# Patient Record
Sex: Female | Born: 1940
Health system: Southern US, Community
[De-identification: ages and names within clinical notes are randomized; demographics above are authoritative.]

## PROBLEM LIST (undated history)

## (undated) DIAGNOSIS — B370 Candidal stomatitis: Secondary | ICD-10-CM

## (undated) DIAGNOSIS — J45909 Unspecified asthma, uncomplicated: Secondary | ICD-10-CM

## (undated) DIAGNOSIS — J4540 Moderate persistent asthma, uncomplicated: Secondary | ICD-10-CM

## (undated) DIAGNOSIS — R42 Dizziness and giddiness: Secondary | ICD-10-CM

## (undated) DIAGNOSIS — M8580 Other specified disorders of bone density and structure, unspecified site: Secondary | ICD-10-CM

## (undated) DIAGNOSIS — H3530 Unspecified macular degeneration: Secondary | ICD-10-CM

## (undated) DIAGNOSIS — M1990 Unspecified osteoarthritis, unspecified site: Secondary | ICD-10-CM

## (undated) DIAGNOSIS — H409 Unspecified glaucoma: Secondary | ICD-10-CM

## (undated) DIAGNOSIS — G43909 Migraine, unspecified, not intractable, without status migrainosus: Secondary | ICD-10-CM

## (undated) DIAGNOSIS — R413 Other amnesia: Secondary | ICD-10-CM

## (undated) DIAGNOSIS — G629 Polyneuropathy, unspecified: Secondary | ICD-10-CM

## (undated) DIAGNOSIS — K219 Gastro-esophageal reflux disease without esophagitis: Secondary | ICD-10-CM

## (undated) DIAGNOSIS — Z8781 Personal history of (healed) traumatic fracture: Secondary | ICD-10-CM

## (undated) DIAGNOSIS — T7840XA Allergy, unspecified, initial encounter: Secondary | ICD-10-CM

## (undated) DIAGNOSIS — S6992XA Unspecified injury of left wrist, hand and finger(s), initial encounter: Secondary | ICD-10-CM

## (undated) DIAGNOSIS — H01009 Unspecified blepharitis unspecified eye, unspecified eyelid: Secondary | ICD-10-CM

## (undated) DIAGNOSIS — G609 Hereditary and idiopathic neuropathy, unspecified: Secondary | ICD-10-CM

## (undated) DIAGNOSIS — J309 Allergic rhinitis, unspecified: Secondary | ICD-10-CM

## (undated) DIAGNOSIS — H1010 Acute atopic conjunctivitis, unspecified eye: Secondary | ICD-10-CM

## (undated) HISTORY — DX: Unspecified osteoarthritis, unspecified site: M19.90

## (undated) HISTORY — PX: CATARACT EXTRACTION, BILATERAL: SHX1313

## (undated) HISTORY — DX: Acute atopic conjunctivitis, unspecified eye: H10.10

## (undated) HISTORY — PX: BUNIONECTOMY: SHX129

## (undated) HISTORY — DX: Unspecified blepharitis unspecified eye, unspecified eyelid: H01.009

## (undated) HISTORY — DX: Hereditary and idiopathic neuropathy, unspecified: G60.9

## (undated) HISTORY — DX: Dizziness and giddiness: R42

## (undated) HISTORY — DX: Unspecified injury of left wrist, hand and finger(s), initial encounter: S69.92XA

## (undated) HISTORY — DX: Allergy, unspecified, initial encounter: T78.40XA

## (undated) HISTORY — DX: Gastro-esophageal reflux disease without esophagitis: K21.9

## (undated) HISTORY — DX: Unspecified macular degeneration: H35.30

## (undated) HISTORY — PX: SALPINGECTOMY: SHX328

## (undated) HISTORY — DX: Allergic rhinitis, unspecified: J30.9

## (undated) HISTORY — DX: Unspecified glaucoma: H40.9

## (undated) HISTORY — DX: Candidal stomatitis: B37.0

## (undated) HISTORY — DX: Other specified disorders of bone density and structure, unspecified site: M85.80

## (undated) HISTORY — PX: THROAT SURGERY: SHX803

## (undated) HISTORY — PX: ECTOPIC PREGNANCY SURGERY: SHX613

## (undated) HISTORY — DX: Other amnesia: R41.3

## (undated) HISTORY — DX: Moderate persistent asthma, uncomplicated: J45.40

## (undated) HISTORY — DX: Polyneuropathy, unspecified: G62.9

## (undated) HISTORY — DX: Migraine, unspecified, not intractable, without status migrainosus: G43.909

## (undated) HISTORY — PX: KNEE SURGERY: SHX244

## (undated) HISTORY — PX: COLONOSCOPY: SHX174

## (undated) HISTORY — DX: Personal history of (healed) traumatic fracture: Z87.81

## (undated) HISTORY — PX: OTHER SURGICAL HISTORY: SHX169

## (undated) HISTORY — DX: Unspecified asthma, uncomplicated: J45.909

---

## 1995-09-04 HISTORY — PX: OTHER SURGICAL HISTORY: SHX169

## 2000-02-23 ENCOUNTER — Encounter: Payer: Self-pay | Admitting: Orthopedic Surgery

## 2000-02-23 ENCOUNTER — Ambulatory Visit (HOSPITAL_COMMUNITY): Admission: RE | Admit: 2000-02-23 | Discharge: 2000-02-23 | Payer: Self-pay | Admitting: Orthopedic Surgery

## 2000-04-25 ENCOUNTER — Encounter: Admission: RE | Admit: 2000-04-25 | Discharge: 2000-04-25 | Payer: Self-pay | Admitting: Family Medicine

## 2000-04-25 ENCOUNTER — Encounter: Payer: Self-pay | Admitting: Family Medicine

## 2001-04-29 ENCOUNTER — Encounter: Payer: Self-pay | Admitting: Family Medicine

## 2001-04-29 ENCOUNTER — Encounter: Admission: RE | Admit: 2001-04-29 | Discharge: 2001-04-29 | Payer: Self-pay | Admitting: Family Medicine

## 2001-08-01 ENCOUNTER — Encounter: Admission: RE | Admit: 2001-08-01 | Discharge: 2001-08-01 | Payer: Self-pay | Admitting: Family Medicine

## 2001-08-01 ENCOUNTER — Encounter: Payer: Self-pay | Admitting: Family Medicine

## 2002-06-19 ENCOUNTER — Encounter (INDEPENDENT_AMBULATORY_CARE_PROVIDER_SITE_OTHER): Payer: Self-pay

## 2002-06-19 ENCOUNTER — Ambulatory Visit (HOSPITAL_COMMUNITY): Admission: RE | Admit: 2002-06-19 | Discharge: 2002-06-19 | Payer: Self-pay | Admitting: Gastroenterology

## 2003-06-07 ENCOUNTER — Encounter: Payer: Self-pay | Admitting: Otolaryngology

## 2003-06-07 ENCOUNTER — Encounter: Admission: RE | Admit: 2003-06-07 | Discharge: 2003-06-07 | Payer: Self-pay | Admitting: Otolaryngology

## 2003-07-16 ENCOUNTER — Emergency Department (HOSPITAL_COMMUNITY): Admission: EM | Admit: 2003-07-16 | Discharge: 2003-07-16 | Payer: Self-pay | Admitting: Emergency Medicine

## 2003-09-04 HISTORY — PX: OTHER SURGICAL HISTORY: SHX169

## 2003-09-28 ENCOUNTER — Ambulatory Visit (HOSPITAL_COMMUNITY): Admission: RE | Admit: 2003-09-28 | Discharge: 2003-09-28 | Payer: Self-pay | Admitting: Internal Medicine

## 2004-04-13 ENCOUNTER — Encounter: Admission: RE | Admit: 2004-04-13 | Discharge: 2004-04-13 | Payer: Self-pay | Admitting: Family Medicine

## 2004-05-25 ENCOUNTER — Encounter: Admission: RE | Admit: 2004-05-25 | Discharge: 2004-05-25 | Payer: Self-pay | Admitting: Orthopaedic Surgery

## 2004-07-21 ENCOUNTER — Ambulatory Visit (HOSPITAL_COMMUNITY): Admission: RE | Admit: 2004-07-21 | Discharge: 2004-07-21 | Payer: Self-pay | Admitting: General Surgery

## 2004-08-16 ENCOUNTER — Inpatient Hospital Stay (HOSPITAL_COMMUNITY): Admission: EM | Admit: 2004-08-16 | Discharge: 2004-08-17 | Payer: Self-pay | Admitting: General Surgery

## 2009-04-07 ENCOUNTER — Encounter: Admission: RE | Admit: 2009-04-07 | Discharge: 2009-04-07 | Payer: Self-pay | Admitting: Family Medicine

## 2010-11-24 ENCOUNTER — Other Ambulatory Visit: Payer: Self-pay | Admitting: Geriatric Medicine

## 2010-11-24 ENCOUNTER — Other Ambulatory Visit (HOSPITAL_COMMUNITY)
Admission: RE | Admit: 2010-11-24 | Discharge: 2010-11-24 | Disposition: A | Discharge status: Home / Self Care | Payer: Medicare Other | Source: Ambulatory Visit | Attending: Geriatric Medicine | Admitting: Geriatric Medicine

## 2010-11-24 DIAGNOSIS — Z1159 Encounter for screening for other viral diseases: Secondary | ICD-10-CM | POA: Insufficient documentation

## 2010-11-24 DIAGNOSIS — Z01419 Encounter for gynecological examination (general) (routine) without abnormal findings: Secondary | ICD-10-CM | POA: Insufficient documentation

## 2011-01-19 NOTE — Op Note (Signed)
NAMEANGY, SWEARENGIN NO.:  [id number]   MEDICAL RECORD NO.:  [id number]                   PATIENT TYPE:  AMB   LOCATION:  ENDO                                 FACILITY:  [facility]   PHYSICIAN:  Bernette Redbird, MD                  DATE OF BIRTH:  06-13-1941   DATE OF PROCEDURE:  06/19/2002  DATE OF DISCHARGE:                                 OPERATIVE REPORT   PROCEDURE:  Colonoscopy with polypectomy.   INDICATIONS FOR PROCEDURE:  Screening for colon cancer in a 70 year old  female with a family history of colon polyps.   FINDINGS:  Erythematous 1.5 cm pedunculated polyp removed from the proximal  ascending colon.   DESCRIPTION OF PROCEDURE:  The nature, purpose, and risks of the procedure  had been discussed with the patient who provided written consent. Sedation  was fentanyl 40 mcg and Versed 4 mg IV without arrhythmias or desaturation.  The Olympus pediatric video colonoscope was advanced with some looping,  overcome by external abdominal compression, to the terminal ileum and  pullback was then performed.   The cecum was somewhat eccentrically located and the scope had to be slid  through a slit to open up the entire base of the cecum, but the appendiceal  orifice was eventually visualized fully and it is felt that no areas of the  cecum were left unseen.   A short distance above the cecum, approximately three folds above the cecum,  there was a 1.5 cm erythematous polyp on a short stalk, benign in  appearance, but with a lot of white exudate, suggesting that it might be a  hemartoma or an inflammatory polyp. The base of the polyp was injected with  1.0 cc of 1:10,000 epinephrine with good blanching and a bleb being formed  after which the Erby coagulator was used to transect the stalk of the polyp,  and there was good hemostasis and no evidence of excessive cautery. The  polyp was retrieved with the [facility] retrieval basket by pulling the scope  out  through the colon and the patient was recolonoscoped to the level of the  polypectomy site and reexamined by a careful pullout, at which time, I saw  no evidence of any other polyps, nor any cancer, colitis, vascular  malformations, or diverticulosis.   Retroflexion in the rectum was attempted, but could not be accomplished.  Reinspection of the rectosigmoid, however, was otherwise unremarkable.   The patient tolerated the procedure well and there were no apparent  complications.    IMPRESSION:  Medium sized to medium-large polyp removed from the ascending  colon, otherwise unremarkable examination in a patient with a family history  of colon polyps.   PLAN:  Await pathology.  Bernette Redbird, MD    RB/MEDQ  D:  06/19/2002  T:  06/21/2002  Job:  [id number]   cc:   Mosetta Putt, MD

## 2011-01-19 NOTE — Op Note (Signed)
Victoria Trevino, Victoria Trevino NO.:  [id number]   MEDICAL RECORD NO.:  [id number]          PATIENT TYPE:  AMB   LOCATION:  DAY                          FACILITY:  [facility]   PHYSICIAN:  Adolph Pollack, M.D.DATE OF BIRTH:  06-03-1941   DATE OF PROCEDURE:  08/15/2004  DATE OF DISCHARGE:                                 OPERATIVE REPORT   PREOPERATIVE DIAGNOSIS:  Gastroesophageal reflux disease with small hiatal  hernia.   POSTOPERATIVE DIAGNOSIS:  Gastroesophageal reflux disease with small hiatal  hernia.   PROCEDURE:  Laparoscopic Nissen fundoplication and hiatal hernia repair.   SURGEON:  Adolph Pollack, M.D.   ASSISTANT:  Consuello Bossier, M.D.   ANESTHESIA:  General.   INDICATION:  Victoria Trevino is a 70 year old female, who has been having  problems with gastroesophageal reflux disease that was initially well-  controlled.  However, she has had increasing difficulties, and now she has  supra-esophageal manifestations as well.  She has been having breakthrough  symptoms.  She now presents for elective antireflux surgery.  The procedure  and the risks as well as the success rates were discussed with her  preoperatively.   TECHNIQUE:  She is seen in the holding area, then brought to the operating  room, placed supine on the operating table, and given general anesthetic.  A  Foley catheter was placed in the bladder.  The abdominal wall was sterilely  prepped and draped.  Dilute Marcaine solution was infiltrated in the  supraumbilical region, and a small supraumbilical incision was made,  incising the skin, subcutaneous tissue, fascia, and peritoneum.  The  peritoneal cavity was entered under direct vision.  A pursestring suture of  0 Vicryl was placed around the fascial edges.  A Hasson trochar was  introduced into the peritoneal cavity, and a pneumoperitoneum was created by  insufflation of CO2 gas.   Next, the laparoscope was introduced.  Under direct vision,  a 5 mm trocar  was placed in the right mid lateral abdomen, and a 5 mm liver retractor was  placed through it.  The left lobe of the liver was retracted anteriorly,  exposing the hiatus. Two 10 mm trocars were then placed in the right upper  quadrant and one in the left upper quadrant.  The gastroesophageal junction  was gently grasped and retracted toward the left, exposing the thing  gastrohepatic ligament which then was divided using the harmonic scalpel at  the level of the right crus.  The thin phrenoesophageal ligament anteriorly  was divided with the harmonic scalpel exposing the left crus.  Using blunt  dissection, the right crus was dissected free from the esophagus, and a  retroesophageal window was created.   Next, I grasped the mid fundus and divided short gastric vessels with the  harmonic scalpel and between hemoclips, mobilizing the mid fundus.  Following this, a Penrose drain was placed across the gastroesophageal  junction, and it was retracted anteriorly exposing a small hiatal hernia.  I  subsequently closed the hernia with three interrupted size 0 nonabsorbable  sutures.  A size 50 lighted  Bougie was then passed down the esophagus into  the stomach.  The fundus was then grasped and brought through the  retroesophageal window, and then a 360-degree fundoplication was performed,  measuring 2.5 cm in length.  First, two stitches of the fundoplication  involved the left leaf of the wrap, the esophagus, and the right leaf of the  wrap.  The third and final stitches involved the right and left leaves of  the wrap.  Clips were then placed on all the fundoplication stitches.  The  Bougie was removed intact.  No esophageal injury or gastric injury was  noted.   The wrap was floppy and under no tension.  The area was hemostatic.  I  removed the liver retractor.  The trocars were then all removed.  The  supraumbilical fascial defect was closed by tightening up and tying down  the  pursestring suture.  The skin incisions were closed with 4-0 Monocryl  subcuticular stitches followed by Steri-Strips and sterile dressings.   She tolerated the procedure well without any apparent complications and was  subsequently taken to the recovery room in satisfactory condition.     Victoria Trevino  D:  08/15/2004  T:  08/15/2004  Job:  [id number]   cc:   Lucky Cowboy, MD  [phone] [place]  [place] [address]  Fax: [phone]   Mosetta Putt, M.D.  [address] [place]  [place] [address]  Fax: [phone]

## 2011-01-19 NOTE — Op Note (Signed)
NAME:  Victoria Trevino, Victoria Trevino                           ACCOUNT NO.:  [id number]   MEDICAL RECORD NO.:  [id number]                   PATIENT TYPE:  AMB   LOCATION:  ENDO                                 FACILITY:  [facility]   PHYSICIAN:  Wilhemina Bonito. Marina Goodell, M.D. [facility]             DATE OF BIRTH:  01-May-1941   DATE OF PROCEDURE:  09/28/2003  DATE OF DISCHARGE:  09/28/2003                                 OPERATIVE REPORT   PROCEDURE:  Esophageal manometry.   REFERRING PHYSICIAN:  Dr. Lucky Cowboy.   INDICATION:  Chronic cough and heartburn.   HISTORY:  This is a 69 year old female with symptoms of heartburn, cough,  and laryngitis.  An esophageal manometry has been order by Dr. Gerilyn Pilgrim.   DESCRIPTION OF PROCEDURE:  Esophageal manometry was performed at the [facility].  The probe was inserted via the left naris into  the stomach with ease.  Pull-through was uneventful and adequate data  obtained for interpretation.  The findings are as follows:   1. The upper esophageal sphincter demonstrated normal relaxation and     coordination.  2. The esophageal body demonstrated normal wave amplitude and normal wave     provocation, thus normal peristalsis.  3. The lower esophageal sphincter resting pressure was normal at 29.2 mmHg.     In addition, normal and complete relaxation with wet swallowing was     demonstrated repeatedly.   IMPRESSION:  Normal esophageal manometry.   PLANS:  Per Dr. Gerilyn Pilgrim.                                               Wilhemina Bonito. Marina Goodell, M.D. [facility]    JNP/MEDQ  D:  10/01/2003  T:  10/01/2003  Job:  [id number]   cc:   Lucky Cowboy, M.D.  [address] [place]  [place] [address]  Fax: [phone]   Mosetta Putt, M.D.  [address] [place]  [place] [address]  Fax: [phone]

## 2011-05-25 ENCOUNTER — Ambulatory Visit (HOSPITAL_COMMUNITY)
Admission: RE | Admit: 2011-05-25 | Discharge: 2011-05-25 | Disposition: A | Discharge status: Home / Self Care | Payer: Medicare Other | Source: Ambulatory Visit | Attending: Geriatric Medicine | Admitting: Geriatric Medicine

## 2011-05-25 ENCOUNTER — Other Ambulatory Visit (HOSPITAL_COMMUNITY): Payer: Self-pay | Admitting: Geriatric Medicine

## 2011-05-25 DIAGNOSIS — R51 Headache: Secondary | ICD-10-CM | POA: Insufficient documentation

## 2011-05-25 DIAGNOSIS — R52 Pain, unspecified: Secondary | ICD-10-CM

## 2011-05-25 DIAGNOSIS — H9209 Otalgia, unspecified ear: Secondary | ICD-10-CM | POA: Insufficient documentation

## 2011-05-25 DIAGNOSIS — R42 Dizziness and giddiness: Secondary | ICD-10-CM | POA: Insufficient documentation

## 2013-01-25 ENCOUNTER — Emergency Department (INDEPENDENT_AMBULATORY_CARE_PROVIDER_SITE_OTHER): Payer: Medicare Other

## 2013-01-25 ENCOUNTER — Encounter (HOSPITAL_COMMUNITY): Payer: Self-pay | Admitting: Emergency Medicine

## 2013-01-25 ENCOUNTER — Emergency Department (HOSPITAL_COMMUNITY)
Admission: EM | Admit: 2013-01-25 | Discharge: 2013-01-25 | Disposition: A | Discharge status: Home / Self Care | Payer: Medicare Other | Source: Home / Self Care | Attending: Family Medicine | Admitting: Family Medicine

## 2013-01-25 DIAGNOSIS — S93409A Sprain of unspecified ligament of unspecified ankle, initial encounter: Secondary | ICD-10-CM

## 2013-01-25 DIAGNOSIS — S93401A Sprain of unspecified ligament of right ankle, initial encounter: Secondary | ICD-10-CM

## 2013-01-25 HISTORY — DX: Unspecified asthma, uncomplicated: J45.909

## 2013-01-25 MED ORDER — IBUPROFEN 800 MG PO TABS
800.0000 mg | ORAL_TABLET | Freq: Once | ORAL | Status: AC
  Administered 2013-01-25: 800 mg via ORAL

## 2013-01-25 MED ORDER — IBUPROFEN 800 MG PO TABS
ORAL_TABLET | ORAL | Status: AC
  Filled 2013-01-25: qty 1

## 2013-01-25 MED ORDER — HYDROCODONE-ACETAMINOPHEN 5-325 MG PO TABS: 1.0000 | ORAL_TABLET | ORAL | Status: DC | PRN

## 2013-01-25 NOTE — ED Notes (Signed)
Pt states that she was walking down some steps and missed a couple and landed with right foot toes pointed downward into ground. Pt states did not have pain at first. Was able to walk on ankle. Pain started later in the evening. Pt has used ice for comfort.

## 2013-01-25 NOTE — ED Provider Notes (Signed)
History     CSN: [id number]  Arrival date & time 01/25/13  1613   First MD Initiated Contact with Patient 01/25/13 1827      Chief Complaint  Patient presents with  . Ankle Injury    injury to right ankle missed steps.     Patient is a 72 y.o. female presenting with ankle pain. The history is provided by the patient.  Ankle Pain Location:  Ankle Time since incident:  11 hours Injury: yes   Mechanism of injury: fall   Fall:    Fall occurred:  Down stairs   Height of fall:  2 ft   Impact surface:  [employer] of impact:  Feet   Entrapped after fall: no   Ankle location:  R ankle Pain details:    Quality:  Pressure, sharp and aching   Radiates to:  Does not radiate   Severity:  Moderate   Duration:  6 hours   Timing:  Constant   Progression:  Worsening Chronicity:  New Dislocation: no   Foreign body present:  No foreign bodies Prior injury to area:  No Relieved by:  Rest Worsened by:  Bearing weight Ineffective treatments:  Acetaminophen Associated symptoms: swelling   Risk factors: no concern for non-accidental trauma   Pt reports while visiting her sister in [place] this morning she was preparing to leave and was coming down some indoor stairs when she missed a step. She did not completely fall but landed awkwardly on her (R) ankle. At the time she had minimal pain and did not think much of it. As she walked on it more and more during her flight home she noted subtle increase in pain. By the time she arrived in [place] she found she was unable to bear full weight d/t severe pain that by now was associated w/ noticeable swelling to the (R) ankle as well.  Past Medical History  Diagnosis Date  . Asthma     Past Surgical History  Procedure Laterality Date  . Knee surgery    . Ectopic pregnancy surgery    . Throat surgery    . [employer] infundibulm    . Back surgery      History reviewed. No pertinent family history.  History  Substance Use Topics  .  Smoking status: Never Smoker   . Smokeless tobacco: Not on file  . Alcohol Use: No    OB History   Grav Para Term Preterm Abortions TAB SAB Ect Mult Living                  Review of Systems  All other systems reviewed and are negative.    Allergies  Review of patient's allergies indicates no known allergies.  Home Medications   Current Outpatient Rx  Name  Route  Sig  Dispense  Refill  . brimonidine (ALPHAGAN P) 0.1 % SOLN               . Cetirizine HCl (ZYRTEC ALLERGY PO)   Oral   Take by mouth.         Marland Kitchen omeprazole (PRILOSEC) 10 MG capsule   Oral   Take 10 mg by mouth daily.           BP 119/53  Pulse 63  Temp(Src) 97.9 F (36.6 C) (Oral)  Resp 16  SpO2 100%  Physical Exam  Constitutional: She is oriented to person, place, and time. She appears well-developed and well-nourished.  HENT:  Head:  Normocephalic and atraumatic.  Eyes: Conjunctivae are normal.  Neck: Neck supple.  Cardiovascular: Normal rate.   Musculoskeletal:       Feet:  Mild swelling and TTP over the (R) medial malleolus. No open wound or obvious deformity.  Neurological: She is alert and oriented to person, place, and time.  Skin: Skin is warm and dry.  Psychiatric: She has a normal mood and affect.    ED Course  Procedures (including critical care time)  Labs Reviewed - No data to display Dg Ankle Complete Right  01/25/2013   *RADIOLOGY REPORT*  Clinical Data: Injured right ankle, medial pain.  RIGHT ANKLE - COMPLETE 3+ VIEW  Comparison: None.  Findings: No evidence of acute fracture or dislocation.  Ankle mortise intact with well-preserved joint space.  Well-preserved bone mineral density for age.  No intrinsic osseous abnormality. Moderately large joint effusion.  IMPRESSION: No osseous abnormality.   Original Report Authenticated By: Hulan Saas, M.D.     No diagnosis found.    MDM  Increased pain and swelling to inner aspect of (R) ankle after stumbling down a  couple of indoor stairs at approx 0730 today. Imaging neg. ASO applied and pt fitted for crutches. Pt instructed to RICE and use crutches to assist w/ weight bearing until pain improved. Encouraged to start Ibuprofen TID x 3 to 4 days and use Hydrocodone/APAP as needed for more sever pain. Pt to arrange f/u w/ her orthopedic this week.         Leanne Chang, NP 01/27/13 [phone]

## 2013-01-28 NOTE — ED Provider Notes (Signed)
Medical screening examination/treatment/procedure(s) were performed by non-physician practitioner and as supervising physician I was immediately available for consultation/collaboration.   MORENO-COLL,Xitlaly Ault; MD  Raheel Kunkle Moreno-Coll, MD 01/28/13 0752 

## 2014-03-18 ENCOUNTER — Other Ambulatory Visit (HOSPITAL_COMMUNITY): Payer: Self-pay

## 2014-03-19 ENCOUNTER — Ambulatory Visit (HOSPITAL_COMMUNITY)
Admission: RE | Admit: 2014-03-19 | Discharge: 2014-03-19 | Disposition: A | Discharge status: Home / Self Care | Payer: Medicare Other | Source: Ambulatory Visit | Attending: Geriatric Medicine | Admitting: Geriatric Medicine

## 2014-03-19 ENCOUNTER — Encounter (HOSPITAL_COMMUNITY): Payer: Self-pay | Admitting: Pharmacy Technician

## 2014-03-19 DIAGNOSIS — M810 Age-related osteoporosis without current pathological fracture: Secondary | ICD-10-CM | POA: Diagnosis present

## 2014-03-19 DIAGNOSIS — M949 Disorder of cartilage, unspecified: Secondary | ICD-10-CM | POA: Diagnosis not present

## 2014-03-19 DIAGNOSIS — IMO0002 Reserved for concepts with insufficient information to code with codable children: Secondary | ICD-10-CM | POA: Diagnosis not present

## 2014-03-19 DIAGNOSIS — M899 Disorder of bone, unspecified: Secondary | ICD-10-CM | POA: Diagnosis not present

## 2014-03-19 MED ORDER — ZOLEDRONIC ACID 5 MG/100ML IV SOLN
5.0000 mg | Freq: Once | INTRAVENOUS | Status: AC
  Administered 2014-03-19: 5 mg via INTRAVENOUS

## 2014-03-19 MED ORDER — ZOLEDRONIC ACID 5 MG/100ML IV SOLN
INTRAVENOUS | Status: AC
  Filled 2014-03-19: qty 100

## 2014-03-19 NOTE — Discharge Instructions (Signed)

## 2015-03-23 ENCOUNTER — Other Ambulatory Visit (HOSPITAL_COMMUNITY): Payer: Self-pay | Admitting: *Deleted

## 2015-03-24 ENCOUNTER — Ambulatory Visit (HOSPITAL_COMMUNITY)
Admission: RE | Admit: 2015-03-24 | Discharge: 2015-03-24 | Disposition: A | Discharge status: Home / Self Care | Payer: Commercial Managed Care - HMO | Source: Ambulatory Visit | Attending: Geriatric Medicine | Admitting: Geriatric Medicine

## 2015-03-24 DIAGNOSIS — M8580 Other specified disorders of bone density and structure, unspecified site: Secondary | ICD-10-CM | POA: Diagnosis not present

## 2015-03-24 MED ORDER — ZOLEDRONIC ACID 5 MG/100ML IV SOLN: 5.0000 mg | Freq: Once | INTRAVENOUS | Status: DC

## 2015-03-24 MED ORDER — ZOLEDRONIC ACID 5 MG/100ML IV SOLN
INTRAVENOUS | Status: AC
  Administered 2015-03-24: 5 mg
  Filled 2015-03-24: qty 100

## 2015-03-24 MED ORDER — SODIUM CHLORIDE 0.9 % IV SOLN
INTRAVENOUS | Status: DC
  Administered 2015-03-24: 10:00:00 via INTRAVENOUS

## 2015-06-22 ENCOUNTER — Other Ambulatory Visit: Payer: Self-pay | Admitting: Internal Medicine

## 2015-06-22 ENCOUNTER — Ambulatory Visit
Admission: RE | Admit: 2015-06-22 | Discharge: 2015-06-22 | Disposition: A | Discharge status: Home / Self Care | Payer: Commercial Managed Care - HMO | Source: Ambulatory Visit | Attending: Internal Medicine | Admitting: Internal Medicine

## 2015-06-22 DIAGNOSIS — S93602A Unspecified sprain of left foot, initial encounter: Secondary | ICD-10-CM

## 2015-06-24 ENCOUNTER — Other Ambulatory Visit: Payer: Self-pay | Admitting: Geriatric Medicine

## 2015-06-24 ENCOUNTER — Ambulatory Visit
Admission: RE | Admit: 2015-06-24 | Discharge: 2015-06-24 | Disposition: A | Discharge status: Home / Self Care | Payer: Commercial Managed Care - HMO | Source: Ambulatory Visit | Attending: Geriatric Medicine | Admitting: Geriatric Medicine

## 2015-06-24 DIAGNOSIS — M542 Cervicalgia: Secondary | ICD-10-CM

## 2015-08-02 ENCOUNTER — Encounter: Payer: Self-pay | Admitting: Allergy and Immunology

## 2015-08-02 ENCOUNTER — Ambulatory Visit (INDEPENDENT_AMBULATORY_CARE_PROVIDER_SITE_OTHER): Payer: Commercial Managed Care - HMO | Admitting: Allergy and Immunology

## 2015-08-02 VITALS — BP 112/62 | HR 80 | Resp 20 | Ht 68.0 in

## 2015-08-02 DIAGNOSIS — J387 Other diseases of larynx: Secondary | ICD-10-CM

## 2015-08-02 DIAGNOSIS — J309 Allergic rhinitis, unspecified: Secondary | ICD-10-CM | POA: Diagnosis not present

## 2015-08-02 DIAGNOSIS — J4540 Moderate persistent asthma, uncomplicated: Secondary | ICD-10-CM | POA: Insufficient documentation

## 2015-08-02 DIAGNOSIS — K219 Gastro-esophageal reflux disease without esophagitis: Secondary | ICD-10-CM

## 2015-08-02 DIAGNOSIS — H1010 Acute atopic conjunctivitis, unspecified eye: Secondary | ICD-10-CM | POA: Diagnosis not present

## 2015-08-02 DIAGNOSIS — J4541 Moderate persistent asthma with (acute) exacerbation: Secondary | ICD-10-CM

## 2015-08-02 HISTORY — DX: Moderate persistent asthma, uncomplicated: J45.40

## 2015-08-02 HISTORY — DX: Gastro-esophageal reflux disease without esophagitis: K21.9

## 2015-08-02 HISTORY — DX: Acute atopic conjunctivitis, unspecified eye: H10.10

## 2015-08-02 MED ORDER — METHYLPREDNISOLONE ACETATE 80 MG/ML IJ SUSP
80.0000 mg | Freq: Once | INTRAMUSCULAR | Status: AC
  Administered 2015-08-02: 80 mg via INTRAMUSCULAR

## 2015-08-02 MED ORDER — IPRATROPIUM-ALBUTEROL 0.5-2.5 (3) MG/3ML IN SOLN
3.0000 mL | Freq: Once | RESPIRATORY_TRACT | Status: AC
  Administered 2015-08-02: 3 mL via RESPIRATORY_TRACT

## 2015-08-02 NOTE — Progress Notes (Signed)
[facility] [place]  Follow-up Note  Refering Provider: Merlene LaughterStoneking, Hal, MD Primary Provider: Ginette OttoSTONEKING,HAL THOMAS, MD  Subjective:   Victoria Trevino is a 74 y.o. female who [facility] in re-evaluation of the following:  HPI Comments:  [name] returns to this clinic on 08/02/2015 in reevaluation of her asthma, allergic rhinoconjunctivitis, and LPR. She was doing great for the past 6 months but unfortunately this past Friday she developed cough with initially green phlegm production, shortness of breath, nasal congestion, nose blowing, stuffy head, and anosmia. She added her Alvesco to her Symbicort when she became sick. And she has been using her bronchodilator recently. Her reflux is been under good control as long she continues to use omeprazole 20 mg twice a day and ranitidine in the evening. If she uses omeprazole one time per day she gets reflux and regurgitation. She's not had any fever or ugly nasal discharge with this event. There is been no obvious contacts with individuals who have been sick.   Outpatient Encounter Prescriptions as of 08/02/2015  Medication Sig  . albuterol (PROVENTIL HFA;VENTOLIN HFA) 108 (90 BASE) MCG/ACT inhaler Inhale 2 puffs into the lungs every 6 (six) hours as needed for wheezing or shortness of breath (for rescue inhaler).  Marland Kitchen. antiseptic oral rinse (BIOTENE) LIQD 15 mLs by Mouth Rinse route 2 (two) times daily.  . budesonide-formoterol (SYMBICORT) 160-4.5 MCG/ACT inhaler Inhale 2 puffs into the lungs daily.  . Calcium-Magnesium-Vitamin D (CALCIUM 500) 500-250-200 MG-MG-UNIT TABS Take 1 tablet by mouth daily.  . cetirizine (ZYRTEC) 10 MG tablet Take 10 mg by mouth daily.  . Cholecalciferol (VITAMIN D-3) 1000 UNITS CAPS Take 1,000 Units by mouth daily.  . fluconazole (DIFLUCAN) 100 MG tablet Take 100 mg by mouth as needed (to be taken after an asthma attach for the thrush).  .  fluticasone (FLONASE) 50 MCG/ACT nasal spray Place 2 sprays into both nostrils daily.  . hydroxypropyl methylcellulose (ISOPTO TEARS) 2.5 % ophthalmic solution Place 2 drops into both eyes 2 (two) times daily.  Marland Kitchen. latanoprost (XALATAN) 0.005 % ophthalmic solution Place 1 drop into both eyes at bedtime.  . Multiple Vitamin (MULTIVITAMIN WITH MINERALS) TABS tablet Take 1 tablet by mouth daily.  . Omega-3 Fatty Acids (FISH OIL) 1000 MG CAPS Take 1,000 mg by mouth daily.  Marland Kitchen. omeprazole (PRILOSEC) 20 MG capsule Take 20 mg by mouth 2 (two) times daily before a meal.  . ranitidine (ZANTAC) 300 MG tablet Take 1 tablet by mouth at bedtime.  . Saline (SIMPLY SALINE) 0.9 % AERS Place 2 sprays into the nose daily.  . Calcium-Vitamin D-Vitamin K (CALCIUM + D) 734 076 7397-40 MG-UNT-MCG CHEW Chew 1 tablet by mouth 2 (two) times daily. With lunch and dinner  . ciclesonide (ALVESCO) 160 MCG/ACT inhaler Inhale 1 puff into the lungs See admin instructions. Use only when having an asthma attack  . PREDNISONE PO Take 1 tablet by mouth as needed (to be taken after an asthma attack).   No facility-administered encounter medications on file as of 08/02/2015.    No orders of the defined types were placed in this encounter.    Past Medical History  Diagnosis Date  . Asthma     Past Surgical History  Procedure Laterality Date  . Knee surgery    . Ectopic pregnancy surgery    . Throat surgery    . [employer] infundibulm    . Back surgery  No Known Allergies  Review of Systems  Constitutional: Negative.   HENT: Positive for congestion, rhinorrhea, sinus pressure and sneezing. Negative for ear discharge, ear pain, mouth sores, nosebleeds, postnasal drip and sore throat.   Eyes: Negative.   Respiratory: Positive for cough, shortness of breath and wheezing. Negative for choking, chest tightness and stridor.   Cardiovascular: Negative.   Gastrointestinal: Negative.   Musculoskeletal: Negative.   Skin: Negative.    Hematological: Negative.      Objective:   Filed Vitals:   08/02/15 1010  BP: 112/62  Pulse: 80  Resp: 20   Height:  (172.7 cm)      Physical Exam  Constitutional: She appears well-developed and well-nourished. No distress.  Coughing  HENT:  Head: Normocephalic and atraumatic. Head is without right periorbital erythema and without left periorbital erythema.  Right Ear: Tympanic membrane, external ear and ear canal normal. No drainage or tenderness. No foreign bodies. Tympanic membrane is not injected, not scarred, not perforated, not erythematous, not retracted and not bulging. No middle ear effusion.  Left Ear: Tympanic membrane, external ear and ear canal normal. No drainage or tenderness. No foreign bodies. Tympanic membrane is not injected, not scarred, not perforated, not erythematous, not retracted and not bulging.  No middle ear effusion.  Nose: Mucosal edema present. No rhinorrhea, nose lacerations or sinus tenderness.  No foreign bodies.  Mouth/Throat: Oropharynx is clear and moist. No oropharyngeal exudate, posterior oropharyngeal edema, posterior oropharyngeal erythema or tonsillar abscesses.  Eyes: Lids are normal. Right eye exhibits no chemosis, no discharge and no exudate. No foreign body present in the right eye. Left eye exhibits no chemosis, no discharge and no exudate. No foreign body present in the left eye. Right conjunctiva is not injected. Left conjunctiva is not injected.  Neck: Neck supple. No tracheal tenderness present. No tracheal deviation and no edema present. No thyroid mass and no thyromegaly present.  Cardiovascular: Normal rate, regular rhythm, S1 normal and S2 normal.  Exam reveals no gallop.   No murmur heard. Pulmonary/Chest: No accessory muscle usage or stridor. No respiratory distress. She has wheezes (Bilateral expiratory wheezes in all lung fields). She has no rhonchi. She has no rales.  Abdominal: Soft.  Lymphadenopathy:       Head (right  side): No tonsillar adenopathy present.       Head (left side): No tonsillar adenopathy present.    She has no cervical adenopathy.  Neurological: She is alert.  Skin: No rash noted. She is not diaphoretic.  Psychiatric: She has a normal mood and affect. Her behavior is normal.    Diagnostics:    Spirometry was performed and demonstrated an FEV1 of 1.77 at 70 % of predicted.  The patient had an Asthma Control Test with the following results: ACT Total Score: 18.    Assessment and Plan:   1. Moderate persistent asthma, with acute exacerbation   2. Allergic rhinoconjunctivitis   3. LPRD (laryngopharyngeal reflux disease)      1. Nebulization with ipratropium and albuterol now  2. Depo-Medrol 80 mg IM now  3. Continue Symbicort 160 2 inhalations twice a day and add Alvesco 160 2 inhalations twice a day during flare up  4. Continue omeprazole 20 mg twice a day and ranitidine 300 mg in the evening  5. Continue nasal fluticasone one spray each nostril one time per day  6. May use OTC Mucinex DM, Zyrtec, and ProAir HFA as needed  7. Further evaluation?  8. Return to  clinic in 3 weeks or earlier if problem  I'll have Kattia use the therapy described above in an attempt to treat her acute exacerbation of asthma most likely triggered by a viral respiratory tract infection and she will keep in contact with me noting her response as we move forward. I would like to see her back in this clinic in 3 weeks to make sure she is going down the road to improvement. She'll contact me during the interval should there be a significant problem.   Laurette Schimke, MD [facility]

## 2015-08-02 NOTE — Patient Instructions (Signed)
  1. Nebulization with ipratropium and albuterol now  2. Depo-Medrol 80 mg IM now  3. Continue Symbicort 160 2 inhalations twice a day and add Alvesco 160 2 inhalations twice a day during flare up  4. Continue omeprazole 20 mg twice a day and ranitidine 300 mg in the evening  5. Continue nasal fluticasone one spray each nostril one time per day  6. May use OTC Mucinex DM, Zyrtec, and ProAir HFA as needed  7. Further evaluation?  8. Return to clinic in 3 weeks or earlier if problem

## 2015-08-16 ENCOUNTER — Ambulatory Visit (INDEPENDENT_AMBULATORY_CARE_PROVIDER_SITE_OTHER): Payer: Commercial Managed Care - HMO | Admitting: Allergy and Immunology

## 2015-08-16 ENCOUNTER — Other Ambulatory Visit: Payer: Self-pay | Admitting: *Deleted

## 2015-08-16 ENCOUNTER — Encounter: Payer: Self-pay | Admitting: Allergy and Immunology

## 2015-08-16 ENCOUNTER — Ambulatory Visit: Payer: Self-pay | Admitting: Allergy and Immunology

## 2015-08-16 VITALS — BP 118/72 | HR 60 | Resp 18

## 2015-08-16 DIAGNOSIS — J387 Other diseases of larynx: Secondary | ICD-10-CM

## 2015-08-16 DIAGNOSIS — K219 Gastro-esophageal reflux disease without esophagitis: Secondary | ICD-10-CM

## 2015-08-16 DIAGNOSIS — J309 Allergic rhinitis, unspecified: Secondary | ICD-10-CM | POA: Diagnosis not present

## 2015-08-16 DIAGNOSIS — J4540 Moderate persistent asthma, uncomplicated: Secondary | ICD-10-CM

## 2015-08-16 DIAGNOSIS — H1010 Acute atopic conjunctivitis, unspecified eye: Secondary | ICD-10-CM

## 2015-08-16 MED ORDER — FLUCONAZOLE 150 MG PO TABS: 150.0000 mg | ORAL_TABLET | Freq: Once | ORAL | Status: DC

## 2015-08-16 NOTE — Progress Notes (Signed)
CALLED AND [facility]

## 2015-08-16 NOTE — Patient Instructions (Signed)
  1. Continue Symbicort 160 2 inhalations twice a day and add Alvesco 160 2 inhalations twice a day during flare up  2. Continue omeprazole 20 mg twice a day and ranitidine 300 mg in the evening  3. Continue nasal fluticasone one spray each nostril one time per day  4. May use OTC Mucinex DM, Zyrtec, and ProAir HFA as needed  5. Diflucan 150 one tablet today.  5. Return to clinic in 6 months or earlier if problem

## 2015-08-16 NOTE — Addendum Note (Signed)
Addended by: Devoria GlassingVONCANNON, Katarzyna Wolven M on: 08/16/2015 04:52 PM   Modules accepted: Orders, Medications

## 2015-08-16 NOTE — Progress Notes (Signed)
[facility] [place]  Follow-up Note  Refering Provider: Merlene Laughter, MD Primary Provider: Ginette Otto, MD  Subjective:   Victoria Trevino is a 74 y.o. female who [facility] in re-evaluation of the following:  HPI Comments:  Victoria Trevino returns to this clinic on 08/16/2015 in reevaluation of her asthma exacerbation that wasn't treated on 08/02/2015. This exacerbation appeared to be triggered by a viral respiratory tract infection. She's much better at this point in time. She has peripheral issues with her head. She is very little issues with her throat. She's not coughing and wheezing and has not had use a short acting bronchodilator in approximately 2 days. She has had some irritation of her throat.   Outpatient Encounter Prescriptions as of 08/16/2015  Medication Sig  . albuterol (PROAIR HFA) 108 (90 BASE) MCG/ACT inhaler Inhale two puffs every four to six hours as needed for cough or wheeze.  Marland Kitchen antiseptic oral rinse (BIOTENE) LIQD 15 mLs by Mouth Rinse route 2 (two) times daily.  . budesonide-formoterol (SYMBICORT) 160-4.5 MCG/ACT inhaler Inhale 2 puffs into the lungs daily.  . Calcium-Magnesium-Vitamin D (CALCIUM 500) 500-250-200 MG-MG-UNIT TABS Take 1 tablet by mouth daily.  . cetirizine (ZYRTEC) 10 MG tablet Take 10 mg by mouth daily.  . Cholecalciferol (VITAMIN D-3) 1000 UNITS CAPS Take 1,000 Units by mouth daily.  . ciclesonide (ALVESCO) 160 MCG/ACT inhaler Inhale 1 puff into the lungs See admin instructions. Use only when having an asthma attack  . fluticasone (FLONASE) 50 MCG/ACT nasal spray Place 2 sprays into both nostrils daily.  . hydroxypropyl methylcellulose (ISOPTO TEARS) 2.5 % ophthalmic solution Place 2 drops into both eyes 2 (two) times daily.  Marland Kitchen latanoprost (XALATAN) 0.005 % ophthalmic solution Place 1 drop into both eyes at bedtime.  . Multiple Vitamin (MULTIVITAMIN WITH MINERALS) TABS  tablet Take 1 tablet by mouth daily.  . Omega-3 Fatty Acids (FISH OIL) 1000 MG CAPS Take 1,000 mg by mouth daily.  Marland Kitchen omeprazole (PRILOSEC) 20 MG capsule Take 20 mg by mouth 2 (two) times daily before a meal.  . ranitidine (ZANTAC) 300 MG tablet Take 1 tablet by mouth at bedtime.  . Saline (SIMPLY SALINE) 0.9 % AERS Place 2 sprays into the nose daily.  . Calcium-Vitamin D-Vitamin K (CALCIUM + D) [phone] MG-UNT-MCG CHEW Chew 1 tablet by mouth 2 (two) times daily. With lunch and dinner  . fluconazole (DIFLUCAN) 100 MG tablet Take 100 mg by mouth as needed (to be taken after an asthma attach for the thrush).  . fluconazole (DIFLUCAN) 150 MG tablet Take 1 tablet (150 mg total) by mouth once.  Marland Kitchen PREDNISONE PO Take 1 tablet by mouth as needed (to be taken after an asthma attack).  . [DISCONTINUED] albuterol (PROVENTIL HFA;VENTOLIN HFA) 108 (90 BASE) MCG/ACT inhaler Inhale 2 puffs into the lungs every 6 (six) hours as needed for wheezing or shortness of breath (for rescue inhaler).   No facility-administered encounter medications on file as of 08/16/2015.    Meds ordered this encounter  Medications  . fluconazole (DIFLUCAN) 150 MG tablet    Sig: Take 1 tablet (150 mg total) by mouth once.    Dispense:  1 tablet    Refill:  0    Past Medical History  Diagnosis Date  . Asthma     Past Surgical History  Procedure Laterality Date  . Knee surgery    . Ectopic pregnancy surgery    .  Throat surgery    . [employer] infundibulm    . Back surgery      No Known Allergies  Review of Systems  Constitutional: Negative.   HENT: Positive for sore throat.   Eyes: Negative.   Respiratory: Negative.   Cardiovascular: Negative.   Gastrointestinal: Negative.   Musculoskeletal: Negative.   Skin: Negative.      Objective:   Filed Vitals:   08/16/15 1029  BP: 118/72  Pulse: 60  Resp: 18          Physical Exam  Constitutional: She appears well-developed and well-nourished. No distress.   HENT:  Head: Normocephalic and atraumatic. Head is without right periorbital erythema and without left periorbital erythema.  Right Ear: Tympanic membrane, external ear and ear canal normal. No drainage or tenderness. No foreign bodies. Tympanic membrane is not injected, not scarred, not perforated, not erythematous, not retracted and not bulging. No middle ear effusion.  Left Ear: Tympanic membrane, external ear and ear canal normal. No drainage or tenderness. No foreign bodies. Tympanic membrane is not injected, not scarred, not perforated, not erythematous, not retracted and not bulging.  No middle ear effusion.  Nose: Nose normal. No mucosal edema, rhinorrhea, nose lacerations or sinus tenderness.  No foreign bodies.  Mouth/Throat: Oropharyngeal exudate (thrush) present. No posterior oropharyngeal edema, posterior oropharyngeal erythema or tonsillar abscesses.  Eyes: Lids are normal. Right eye exhibits no chemosis, no discharge and no exudate. No foreign body present in the right eye. Left eye exhibits no chemosis, no discharge and no exudate. No foreign body present in the left eye. Right conjunctiva is not injected. Left conjunctiva is not injected.  Neck: Neck supple. No tracheal tenderness present. No tracheal deviation and no edema present. No thyroid mass and no thyromegaly present.  Cardiovascular: Normal rate, regular rhythm, S1 normal and S2 normal.  Exam reveals no gallop.   No murmur heard. Pulmonary/Chest: No accessory muscle usage or stridor. No respiratory distress. She has no wheezes. She has no rhonchi. She has no rales.  Abdominal: Soft.  Lymphadenopathy:       Head (right side): No tonsillar adenopathy present.       Head (left side): No tonsillar adenopathy present.    She has no cervical adenopathy.  Neurological: She is alert.  Skin: No rash noted. She is not diaphoretic.  Psychiatric: She has a normal mood and affect. Her behavior is normal.    Diagnostics:     Spirometry was performed and demonstrated an FEV1 of 1.99 at 78 % of predicted.  The patient had an Asthma Control Test with the following results: ACT Total Score: 16.    Assessment and Plan:   1. Moderate persistent asthma, uncomplicated   2. LPRD (laryngopharyngeal reflux disease)   3. Allergic rhinoconjunctivitis      11. Continue Symbicort 160 2 inhalations twice a day and add Alvesco 160 2 inhalations twice a day during flare up  2. Continue omeprazole 20 mg twice a day and ranitidine 300 mg in the evening  3. Continue nasal fluticasone one spray each nostril one time per day  4. May use OTC Mucinex DM, Zyrtec, and ProAir HFA as needed  5. Diflucan 150 one tablet today.  5. Return to clinic in 6 months or earlier if problem  Joe is done well and we'll now see if we can start to consolidate some of her treatment by having her taper off her Alvesco. I have given her Diflucan tablet today for her thrush.  I will see her back in this clinic in 6 months or earlier if there is a problem.   Laurette SchimkeEric Chesky Heyer, MD [facility]

## 2015-09-02 ENCOUNTER — Telehealth: Payer: Self-pay

## 2015-09-02 NOTE — Telephone Encounter (Signed)
Spoke with patient who is now having thrush back in her mouth that started 2-3 days ago. Patient is asking if she could get more Diflucan to help with thrush. Patient advised that Dr Lucie LeatherKozlow will be out of the office till Tuesday and she is ok waiting for him to come back since patient refuses to see or speak with any other doctor. Patient advised that we would contact her back as soon as we heard back from Dr Lucie LeatherKozlow.

## 2015-09-02 NOTE — Telephone Encounter (Signed)
Patient was seen 08/16/2015 for thrush in her mouth. Dr. Lucie LeatherKozlow gave her Diflucan 150 one tablet. She took the Diflucan and it went away. Now the thrush is back and she is wondering can we call in this prescription again.  [facility]  Please Advise  Thanks

## 2015-09-02 NOTE — Telephone Encounter (Signed)
Phone number [phone]

## 2015-09-04 NOTE — Telephone Encounter (Signed)
Please provide patient a prescription for Diflucan 150 one tab;let one time per WEEK for three weeks.

## 2015-09-06 MED ORDER — FLUCONAZOLE 150 MG PO TABS: ORAL_TABLET | ORAL | Status: DC

## 2015-09-06 NOTE — Telephone Encounter (Signed)
Rx sent to patients pharmacy and patient notified. Patient will contact us back if any further problems.

## 2015-09-06 NOTE — Telephone Encounter (Signed)
I notified pt of Dr. Kathyrn LassKozlow's note below. Pls get RX called in today. i let pt know to give us towards the end of the day.

## 2015-09-15 DIAGNOSIS — S82034A Nondisplaced transverse fracture of right patella, initial encounter for closed fracture: Secondary | ICD-10-CM | POA: Diagnosis not present

## 2015-10-13 ENCOUNTER — Telehealth: Payer: Self-pay | Admitting: Allergy and Immunology

## 2015-10-13 DIAGNOSIS — S82034D Nondisplaced transverse fracture of right patella, subsequent encounter for closed fracture with routine healing: Secondary | ICD-10-CM | POA: Diagnosis not present

## 2015-10-13 MED ORDER — FLUCONAZOLE 150 MG PO TABS
ORAL_TABLET | ORAL | Status: DC
Start: 2015-10-13 — End: 2016-02-14

## 2015-10-13 NOTE — Telephone Encounter (Signed)
Medication sent to patient's pharmacy and patient notified. 

## 2015-10-13 NOTE — Telephone Encounter (Signed)
Please have patient use a diflucan 150 single tablet monthly for next 12 months

## 2015-10-13 NOTE — Telephone Encounter (Signed)
Pt called and she said she needed a rx for diflcan for thrush.Marland Kitchen

## 2015-10-13 NOTE — Telephone Encounter (Signed)
Spoke with patient who said that every time she uses her Symbicort 160 daily she gets thrush building up in the back of her mouth. Patient is asking if she could drop 2 puffs once daily to help prevent this. Patient is using a spacer and rinses, gargles, and spits after each use. Patient said that the diflucan really helped with this the last time she had it.

## 2015-11-03 DIAGNOSIS — S82034D Nondisplaced transverse fracture of right patella, subsequent encounter for closed fracture with routine healing: Secondary | ICD-10-CM | POA: Diagnosis not present

## 2015-11-29 ENCOUNTER — Telehealth: Payer: Self-pay

## 2015-11-29 ENCOUNTER — Other Ambulatory Visit: Payer: Self-pay | Admitting: *Deleted

## 2015-11-29 MED ORDER — RANITIDINE HCL 300 MG PO TABS: ORAL_TABLET | ORAL | Status: DC

## 2015-11-29 MED ORDER — BUDESONIDE-FORMOTEROL FUMARATE 160-4.5 MCG/ACT IN AERO: INHALATION_SPRAY | RESPIRATORY_TRACT | Status: DC

## 2015-11-29 MED ORDER — OMEPRAZOLE 20 MG PO CPDR: DELAYED_RELEASE_CAPSULE | ORAL | Status: DC

## 2015-11-29 NOTE — Telephone Encounter (Signed)
Patients insurance is wanting her to switch from mail order to a regular pharmacy. Patient is needing Omeprazole, ranitidine, and Symbicort sent in to [facility]. Patient is wanting a 90 day supply.  DOL Visit 08/2015 Dr. Lucie LeatherKozlow   Thanks

## 2015-11-29 NOTE — Telephone Encounter (Signed)
Rx sent to [facility] 

## 2015-12-26 DIAGNOSIS — M85862 Other specified disorders of bone density and structure, left lower leg: Secondary | ICD-10-CM | POA: Diagnosis not present

## 2015-12-26 DIAGNOSIS — Z1389 Encounter for screening for other disorder: Secondary | ICD-10-CM | POA: Diagnosis not present

## 2015-12-26 DIAGNOSIS — G629 Polyneuropathy, unspecified: Secondary | ICD-10-CM | POA: Diagnosis not present

## 2015-12-26 DIAGNOSIS — M85861 Other specified disorders of bone density and structure, right lower leg: Secondary | ICD-10-CM | POA: Diagnosis not present

## 2015-12-26 DIAGNOSIS — R42 Dizziness and giddiness: Secondary | ICD-10-CM | POA: Diagnosis not present

## 2015-12-26 DIAGNOSIS — R202 Paresthesia of skin: Secondary | ICD-10-CM | POA: Diagnosis not present

## 2015-12-26 DIAGNOSIS — J4530 Mild persistent asthma, uncomplicated: Secondary | ICD-10-CM | POA: Diagnosis not present

## 2015-12-26 DIAGNOSIS — Z0000 Encounter for general adult medical examination without abnormal findings: Secondary | ICD-10-CM | POA: Diagnosis not present

## 2015-12-26 DIAGNOSIS — Z79899 Other long term (current) drug therapy: Secondary | ICD-10-CM | POA: Diagnosis not present

## 2015-12-26 DIAGNOSIS — L989 Disorder of the skin and subcutaneous tissue, unspecified: Secondary | ICD-10-CM | POA: Diagnosis not present

## 2016-01-03 ENCOUNTER — Other Ambulatory Visit: Payer: Self-pay

## 2016-01-03 MED ORDER — OMEPRAZOLE 20 MG PO CPDR: DELAYED_RELEASE_CAPSULE | ORAL | Status: DC

## 2016-01-03 MED ORDER — RANITIDINE HCL 300 MG PO TABS: ORAL_TABLET | ORAL | Status: DC

## 2016-01-05 DIAGNOSIS — G629 Polyneuropathy, unspecified: Secondary | ICD-10-CM | POA: Diagnosis not present

## 2016-01-09 DIAGNOSIS — M85851 Other specified disorders of bone density and structure, right thigh: Secondary | ICD-10-CM | POA: Diagnosis not present

## 2016-01-09 DIAGNOSIS — M85852 Other specified disorders of bone density and structure, left thigh: Secondary | ICD-10-CM | POA: Diagnosis not present

## 2016-01-17 DIAGNOSIS — H401213 Low-tension glaucoma, right eye, severe stage: Secondary | ICD-10-CM | POA: Diagnosis not present

## 2016-02-14 ENCOUNTER — Encounter: Payer: Self-pay | Admitting: Allergy and Immunology

## 2016-02-14 ENCOUNTER — Ambulatory Visit (INDEPENDENT_AMBULATORY_CARE_PROVIDER_SITE_OTHER): Payer: PPO | Admitting: Allergy and Immunology

## 2016-02-14 VITALS — BP 122/72 | HR 80 | Resp 18

## 2016-02-14 DIAGNOSIS — J387 Other diseases of larynx: Secondary | ICD-10-CM

## 2016-02-14 DIAGNOSIS — J4540 Moderate persistent asthma, uncomplicated: Secondary | ICD-10-CM

## 2016-02-14 DIAGNOSIS — H1010 Acute atopic conjunctivitis, unspecified eye: Secondary | ICD-10-CM

## 2016-02-14 DIAGNOSIS — J309 Allergic rhinitis, unspecified: Secondary | ICD-10-CM

## 2016-02-14 DIAGNOSIS — K219 Gastro-esophageal reflux disease without esophagitis: Secondary | ICD-10-CM

## 2016-02-14 MED ORDER — CETIRIZINE HCL 10 MG PO TABS: 10.0000 mg | ORAL_TABLET | Freq: Every day | ORAL | Status: DC

## 2016-02-14 MED ORDER — OMEPRAZOLE 20 MG PO CPDR: DELAYED_RELEASE_CAPSULE | ORAL | Status: DC

## 2016-02-14 NOTE — Patient Instructions (Addendum)
  1. Continue Symbicort 160 2 inhalations twice a day and add Alvesco 160 2 inhalations twice a day during 'flare up'  2. Continue omeprazole 20 mg twice a day and ranitidine 300 mg in the evening  3. Continue nasal fluticasone one spray each nostril one time per day  4. May use OTC Mucinex DM, Zyrtec, and ProAir HFA as needed  5. Diflucan 100 one tablet one time a month.  5. Return to clinic in 6 months or earlier if problem

## 2016-02-14 NOTE — Progress Notes (Signed)
Follow-up Note  Referring Provider: Merlene Laughter, MD Primary Provider: Ginette Otto, MD Date of Office Visit: 02/14/2016  Subjective:   Victoria Trevino (DOB: [birth year]) is a 75 y.o. female who [facility] on 02/14/2016 in re-evaluation of the following:  HPI: Ambriana returns to this clinic in reevaluation of her asthma and allergic rhinitis and LPR. I have not seen her in his clinic since December 2016  During the interval she has had no problems with her asthma. She has not required a systemic steroid or activation of her action plan for asthma flare nor does she have a requirement for a bronchodilator greater than 1 time per week and she can exercise without any difficulty.   Likewise, she's had very little problems with her upper airways and she has not required an antibiotic to treat an episode of sinusitis.   Her throat and GI issue are going quite well while continuing to use therapy directed against reflux.  She has not had any episodes of thrush while consistently using her Diflucan every month    Medication List           antiseptic oral rinse Liqd  15 mLs by Mouth Rinse route 2 (two) times daily.     budesonide-formoterol 160-4.5 MCG/ACT inhaler  Commonly known as:  SYMBICORT  INHALE TWO PUFFS TWICE DAILY TO PREVENT COUGH OR WHEEZE. RINSE MOUTH AFTER USE.     CALCIUM + D [phone] MG-UNT-MCG Chew  Generic drug:  Calcium-Vitamin D-Vitamin K  Chew 1 tablet by mouth 2 (two) times daily. With lunch and dinner     cetirizine 10 MG tablet  Commonly known as:  ZYRTEC  Take 10 mg by mouth daily.     ciclesonide 160 MCG/ACT inhaler  Commonly known as:  ALVESCO  Inhale 1 puff into the lungs See admin instructions. Use only when having an asthma attack     Fish Oil 1000 MG Caps  Take 1,000 mg by mouth daily.     fluconazole 100 MG tablet  Commonly known as:  DIFLUCAN  Take 100 mg by mouth as needed (to be taken after an asthma  attach for the thrush).     fluticasone 50 MCG/ACT nasal spray  Commonly known as:  FLONASE  Place 2 sprays into both nostrils daily.     latanoprost 0.005 % ophthalmic solution  Commonly known as:  XALATAN  Place 1 drop into both eyes daily.     LUMIGAN 0.01 % Soln  Generic drug:  bimatoprost  Place 1 drop into both eyes daily.     multivitamin with minerals Tabs tablet  Take 1 tablet by mouth daily.     omeprazole 20 MG capsule  Commonly known as:  PRILOSEC  TAKE ONE CAPSULE TWICE DAILY     PROAIR HFA 108 (90 Base) MCG/ACT inhaler  Generic drug:  albuterol  Inhale two puffs every four to six hours as needed for cough or wheeze.     ranitidine 300 MG tablet  Commonly known as:  ZANTAC  TAKE ONE TABLET EACH EVENING     SIMPLY SALINE 0.9 % Aers  Generic drug:  Saline  Place 2 sprays into the nose daily.     Vitamin D-3 1000 units Caps  Take 1,000 Units by mouth daily.        Past Medical History  Diagnosis Date  . Asthma     Past Surgical History  Procedure Laterality Date  .  Knee surgery    . Ectopic pregnancy surgery    . Throat surgery    . [employer] infundibulm    . Back surgery      No Known Allergies  Review of systems negative except as noted in HPI / PMHx or noted below:  Review of Systems  Constitutional: Negative.   HENT: Negative.   Eyes: Negative.   Respiratory: Negative.   Cardiovascular: Negative.   Gastrointestinal: Negative.   Genitourinary: Negative.   Musculoskeletal: Negative.   Skin: Negative.   Neurological: Negative.   Endo/Heme/Allergies: Negative.   Psychiatric/Behavioral: Negative.      Objective:   Filed Vitals:   02/14/16 1041  BP: 122/72  Pulse: 80  Resp: 18          Physical Exam  Constitutional: She is well-developed, well-nourished, and in no distress.  HENT:  Head: Normocephalic.  Right Ear: Tympanic membrane, external ear and ear canal normal.  Left Ear: Tympanic membrane, external ear and ear canal  normal.  Nose: Nose normal. No mucosal edema or rhinorrhea.  Mouth/Throat: Uvula is midline, oropharynx is clear and moist and mucous membranes are normal. No oropharyngeal exudate.  Eyes: Conjunctivae are normal.  Neck: Trachea normal. No tracheal tenderness present. No tracheal deviation present. No thyromegaly present.  Cardiovascular: Normal rate, regular rhythm, S1 normal, S2 normal and normal heart sounds.   No murmur heard. Pulmonary/Chest: Breath sounds normal. No stridor. No respiratory distress. She has no wheezes. She has no rales.  Musculoskeletal: She exhibits no edema.  Lymphadenopathy:       Head (right side): No tonsillar adenopathy present.       Head (left side): No tonsillar adenopathy present.    She has no cervical adenopathy.  Neurological: She is alert. Gait normal.  Skin: No rash noted. She is not diaphoretic. No erythema. Nails show no clubbing.  Psychiatric: Mood and affect normal.    Diagnostics:    Spirometry was performed and demonstrated an FEV1 of 1.91 at 79 % of predicted.  The patient had an Asthma Control Test with the following results: ACT Total Score: 21.    Assessment and Plan:   1. Moderate persistent asthma, uncomplicated   2. Allergic rhinoconjunctivitis   3. LPRD (laryngopharyngeal reflux disease)     1. Continue Symbicort 160 2 inhalations twice a day and add Alvesco 160 2 inhalations twice a day during 'flare up'  2. Continue omeprazole 20 mg twice a day and ranitidine 300 mg in the evening  3. Continue nasal fluticasone one spray each nostril one time per day  4. May use OTC Mucinex DM, Zyrtec, and ProAir HFA as needed  5. Diflucan 100 one tablet one time a month.  5. Return to clinic in 6 months or earlier if problem   Victoria Trevino is doing excellent on her current medical therapy and I will continue to have her use a combination of Symbicort and nasal fluticasone and omeprazole and ranitidine to address her reflux and inflammatory  respiratory disease. She has not had activate her action plan for an asthma flare but certainly she understands when she should activate that plan and exactly what that plan entails. Her use of Diflucan every month has kept her from developing significant problems with thrush and thus will continue to use this medication at this point.  Laurette SchimkeEric Kozlow, MD [facility]

## 2016-02-16 ENCOUNTER — Encounter: Payer: Self-pay | Admitting: Neurology

## 2016-02-16 ENCOUNTER — Ambulatory Visit (INDEPENDENT_AMBULATORY_CARE_PROVIDER_SITE_OTHER): Payer: PPO | Admitting: Neurology

## 2016-02-16 ENCOUNTER — Other Ambulatory Visit (INDEPENDENT_AMBULATORY_CARE_PROVIDER_SITE_OTHER): Payer: PPO

## 2016-02-16 VITALS — BP 120/64 | HR 78 | Ht 68.0 in | Wt 129.1 lb

## 2016-02-16 DIAGNOSIS — G609 Hereditary and idiopathic neuropathy, unspecified: Secondary | ICD-10-CM | POA: Insufficient documentation

## 2016-02-16 HISTORY — DX: Hereditary and idiopathic neuropathy, unspecified: G60.9

## 2016-02-16 LAB — TSH: TSH: 1.65 u[IU]/mL (ref 0.35–4.50)

## 2016-02-16 LAB — VITAMIN B12: VITAMIN B 12: 673 pg/mL (ref 211–911)

## 2016-02-16 LAB — SEDIMENTATION RATE: SED RATE: 11 mm/h (ref 0–30)

## 2016-02-16 NOTE — Progress Notes (Signed)
[facility] - Initial Visit   Date: 02/16/2016  Victoria Trevino MRN: [id number] DOB: Feb 21, 1941   Dear Dr. Felipa Eth:  Thank you for your kind referral of Victoria Trevino for consultation of neuropathy. Although her history is well known to you, please allow [place] to reiterate it for the purpose of our medical record. The patient was accompanied to the clinic by husband who also provides collateral information.     History of Present Illness: Victoria Trevino is a 75 y.o. right-handed Caucasian female with asthma, GERD, migraine, s/p cervical decompression and fusion (1997) and glaucoma presenting for evaluation of neuropathy.    Starting around early 2000s, she noticed that the soles of her feet felt like cardboard and stiff.  Over the years, she developed similar symptoms over the dorsum of the feet and associated burning pain.  Painful paresthesias is most noticeable at night when she goes to bed, and wears socks at night because it helps.  The numbness is constant and involves the entire foot.  She endorses imbalance and lightheadedness.  She suffered a fall last year and fractured her right patella.  She walks independently. She had NCS/EMG of the lower extremities in May 2017 performed elsewhere which showed moderate to severe polyneuropathy and is referred for further evaluation.  She denies any weakness of the legs.  She has been seeing a Physiological scientist for leg strengthening since her fall.    She has no history of diabetes, alcoholism, or exposure to solvents.  There is no family history of neuropathy.    Out-side paper records, electronic medical record, and images have been reviewed where available and summarized as:    Past Medical History  Diagnosis Date  . Asthma   . GERD (gastroesophageal reflux disease)     Past Surgical History  Procedure Laterality Date  . Knee surgery    . Ectopic pregnancy surgery    . Throat surgery    . [employer]  infundibulm    . Cervical decompression and fusion  1997     Medications:  Outpatient Encounter Prescriptions as of 02/16/2016  Medication Sig Note  . albuterol (PROAIR HFA) 108 (90 BASE) MCG/ACT inhaler Inhale two puffs every four to six hours as needed for cough or wheeze.   Marland Kitchen antiseptic oral rinse (BIOTENE) LIQD 15 mLs by Mouth Rinse route 2 (two) times daily.   . budesonide-formoterol (SYMBICORT) 160-4.5 MCG/ACT inhaler INHALE TWO PUFFS TWICE DAILY TO PREVENT COUGH OR WHEEZE. RINSE MOUTH AFTER USE.   . Calcium-Vitamin D-Vitamin K (CALCIUM + D) [phone] MG-UNT-MCG CHEW Chew 1 tablet by mouth 2 (two) times daily. With lunch and dinner   . cetirizine (ZYRTEC) 10 MG tablet Take 1 tablet (10 mg total) by mouth daily.   . Cholecalciferol (VITAMIN D-3) 1000 UNITS CAPS Take 1,000 Units by mouth daily.   . ciclesonide (ALVESCO) 160 MCG/ACT inhaler Inhale 1 puff into the lungs See admin instructions. Use only when having an asthma attack 03/19/2014: Pt only uses when having an asthma attack  . fluconazole (DIFLUCAN) 100 MG tablet Take 150 mg by mouth as needed (once a month for 12 months).  03/19/2014: To be taken after an asthma attack for the thrush  . fluticasone (FLONASE) 50 MCG/ACT nasal spray Place 2 sprays into both nostrils daily.   Marland Kitchen LUMIGAN 0.01 % SOLN Place 1 drop into both eyes daily. 02/14/2016: Received from: External Pharmacy Received Sig:   . Multiple Vitamin (MULTIVITAMIN WITH MINERALS) TABS  tablet Take 1 tablet by mouth daily.   . Omega-3 Fatty Acids (FISH OIL) 1000 MG CAPS Take 1,000 mg by mouth daily.   Marland Kitchen omeprazole (PRILOSEC) 20 MG capsule TAKE ONE CAPSULE TWICE DAILY   . ranitidine (ZANTAC) 300 MG tablet TAKE ONE TABLET EACH EVENING   . Saline (SIMPLY SALINE) 0.9 % AERS Place 2 sprays into the nose daily.   . zoledronic acid (RECLAST) 5 MG/100ML SOLN injection Inject 5 mg into the vein once. Once a year.   . [DISCONTINUED] latanoprost (XALATAN) 0.005 % ophthalmic solution Place  1 drop into both eyes daily. Reported on 02/16/2016 02/14/2016: Received from: External Pharmacy Received Sig:    No facility-administered encounter medications on file as of 02/16/2016.     Allergies: No Known Allergies  Family History: Family History  Problem Relation Age of Onset  . Alzheimer's disease Mother     Deceased, 23  . Heart attack Father   . Congestive Heart Failure Father     Deceased, 46  . Emphysema Father   . Skin cancer Sister   . Allergic rhinitis Sister   . Aneurysm Brother   . Skin cancer Brother   . Stroke Maternal Grandmother   . Prostate cancer Maternal Grandfather     Social History: Social History  Substance Use Topics  . Smoking status: Never Smoker   . Smokeless tobacco: Never Used  . Alcohol Use: No   Social History   Social History Narrative   Lives with husband in a one story home.  Has 2 children.  Retired Therapist, sports.      Review of Systems:  CONSTITUTIONAL: No fevers, chills, night sweats, or weight loss.   EYES: No visual changes or eye pain ENT: No hearing changes.  No history of nose bleeds.   RESPIRATORY: No cough, wheezing and shortness of breath.   CARDIOVASCULAR: Negative for chest pain, and palpitations.   GI: Negative for abdominal discomfort, blood in stools or black stools.  No recent change in bowel habits.   GU:  No history of incontinence.   MUSCLOSKELETAL: No history of joint pain or swelling.  No myalgias.   SKIN: Negative for lesions, rash, and itching.   HEMATOLOGY/ONCOLOGY: Negative for prolonged bleeding, bruising easily, and swollen nodes.  No history of cancer.   ENDOCRINE: Negative for cold or heat intolerance, polydipsia or goiter.   PSYCH:  No depression or anxiety symptoms.   NEURO: As Above.   Vital Signs:  BP 120/64 mmHg  Pulse 78  Ht '5\' 8"'  (1.727 m)  Wt 129 lb 2 oz (58.571 kg)  BMI 19.64 kg/m2  SpO2 98%   General Medical Exam:   General:  Well appearing, comfortable.   Eyes/ENT: see cranial nerve  examination.   Neck: No masses appreciated.  Full range of motion without tenderness.  No carotid bruits. Respiratory:  Clear to auscultation, good air entry bilaterally.   Cardiac:  Regular rate and rhythm, no murmur.   Extremities:  No deformities, edema, or skin discoloration.  Skin:  No rashes or lesions.  Neurological Exam: MENTAL STATUS including orientation to time, place, person, recent and remote memory, attention span and concentration, language, and fund of knowledge is normal.  Speech is not dysarthric.  CRANIAL NERVES: II:  No visual field defects.  Unremarkable fundi.   III-IV-VI: Pupils equal round and reactive to light.  Normal conjugate, extra-ocular eye movements in all directions of gaze.  No nystagmus.  No ptosis.   V:  Normal facial  sensation.   VII:  Normal facial symmetry and movements.  VIII:  Normal hearing and vestibular function.   IX-X:  Normal palatal movement.   XI:  Normal shoulder shrug and head rotation.   XII:  Normal tongue strength and range of motion, no deviation or fasciculation.  MOTOR:  No atrophy, fasciculations or abnormal movements.  No pronator drift.  Tone is normal.    Right Upper Extremity:    Left Upper Extremity:    Deltoid  5/5   Deltoid  5/5   Biceps  5/5   Biceps  5/5   Triceps  5/5   Triceps  5/5   Wrist extensors  5/5   Wrist extensors  5/5   Wrist flexors  5/5   Wrist flexors  5/5   Finger extensors  5/5   Finger extensors  5/5   Finger flexors  5/5   Finger flexors  5/5   Dorsal interossei  5/5   Dorsal interossei  5/5   Abductor pollicis  5/5   Abductor pollicis  5/5   Tone (Ashworth scale)  0  Tone (Ashworth scale)  0   Right Lower Extremity:    Left Lower Extremity:    Hip flexors  5/5   Hip flexors  5/5   Hip extensors  5/5   Hip extensors  5/5   Knee flexors  5/5   Knee flexors  5/5   Knee extensors  5/5   Knee extensors  5/5   Dorsiflexors  5/5   Dorsiflexors  5/5   Plantarflexors  5/5   Plantarflexors  5/5   Toe  extensors  5/5   Toe extensors  5/5   Toe flexors  3/5   Toe flexors  3/5   Tone (Ashworth scale)  0  Tone (Ashworth scale)  0   MSRs:  Right                                                                 Left brachioradialis 2+  brachioradialis 2+  biceps 2+  biceps 2+  triceps 2+  triceps 2+  patellar 2+  patellar 2+  ankle jerk trace  ankle jerk 0  Hoffman no  Hoffman no  plantar response down  plantar response down   SENSORY:  Vibration reduced to 70% at ankles and trace at the great toe.  Temperature, light touch, and pin prick is reduced in a gradient pattern distal to ankles.  Impaired proprioception at the great toe.  There is mild sway with Rhomberg testing.  COORDINATION/GAIT: Normal finger-to- nose-finger.  Intact rapid alternating movements bilaterally.  Able to rise from a chair without using arms.  Gait narrow based and stable. Unsteady with tandem gait.  Stressed gait intact.    IMPRESSION: Victoria Trevino is a 75 year-old female referred for evaluation of bilateral feet paresthesias.  Her neurological examination shows a distal mixed fiber peripheral neuropathy. I had extensive discussion with the patient regarding the pathogenesis, etiology, management, and natural course of neuropathy. Neuropathy tends to be slowly progressive, especially if a treatable etiology is not identified.  I would like to test for treatable causes of neuropathy. I discussed that in the vast majority of cases, despite checking for reversible causes, we are unable to find  the underlying etiology and management is symptomatic. Given that she has no risk factors for neuropathy, I suspect she most likely has idiopathic peripheral neuropathy.    PLAN/RECOMMENDATIONS:  1.  Check ESR, vitamin B12, TSH, copper, SPEP with IFE 2.  Encouraged to work with Occupational psychologist 3.  Recommend using a cane for uneven surfaces 4.  Fall precautions discussed   Return to clinic in 1  year.   The duration of this appointment visit was 45 minutes of face-to-face time with the patient.  Greater than 50% of this time was spent in counseling, explanation of diagnosis, planning of further management, and coordination of care.   Thank you for allowing me to participate in patient's care.  If I can answer any additional questions, I would be pleased to do so.    Sincerely,    Donika K. Posey Pronto, DO

## 2016-02-16 NOTE — Patient Instructions (Addendum)
1.  Check blood work 2.  Encouraged to work with Physiological scientist for Hotel manager.  Exercises such as tai chi, yoga, and pilates can be helpful 3.  Recommend using a cane for uneven surfaces  Return to clinic in 1 year

## 2016-02-16 NOTE — Progress Notes (Signed)
Note routed

## 2016-02-17 DIAGNOSIS — H401213 Low-tension glaucoma, right eye, severe stage: Secondary | ICD-10-CM | POA: Diagnosis not present

## 2016-02-18 LAB — COPPER, SERUM: Copper: 108 ug/dL (ref 72–166)

## 2016-02-22 LAB — PROTEIN ELECTROPHORESIS, SERUM
ALBUMIN ELP: 4.2 g/dL (ref 3.8–4.8)
Alpha-1-Globulin: 0.3 g/dL (ref 0.2–0.3)
Alpha-2-Globulin: 0.7 g/dL (ref 0.5–0.9)
Beta 2: 0.4 g/dL (ref 0.2–0.5)
Beta Globulin: 0.4 g/dL (ref 0.4–0.6)
Gamma Globulin: 1.2 g/dL (ref 0.8–1.7)
TOTAL PROTEIN, SERUM ELECTROPHOR: 7.1 g/dL (ref 6.1–8.1)

## 2016-02-22 LAB — IMMUNOFIXATION ELECTROPHORESIS
IGG (IMMUNOGLOBIN G), SERUM: 1213 mg/dL (ref 694–1618)
IgA: 259 mg/dL (ref 81–463)
IgM, Serum: 112 mg/dL (ref 48–271)

## 2016-02-28 ENCOUNTER — Telehealth: Payer: Self-pay | Admitting: Allergy and Immunology

## 2016-02-28 NOTE — Telephone Encounter (Signed)
Pt called and wanted to see if she could use the omeprazole 3 times a day for  Now because she is having reflux pretty bad. [phone] [facility]

## 2016-02-28 NOTE — Telephone Encounter (Signed)
Please advise 

## 2016-02-28 NOTE — Telephone Encounter (Signed)
Please inform patient that she can use omeprazole 3 times a day but she may also want to consider some other type of therapy. She can try samples of Dexilant 60 mg twice a day.

## 2016-02-28 NOTE — Telephone Encounter (Signed)
Pt wants to try the omeprazole tid first before trying the dexilant.

## 2016-03-02 DIAGNOSIS — M8580 Other specified disorders of bone density and structure, unspecified site: Secondary | ICD-10-CM | POA: Diagnosis not present

## 2016-03-02 DIAGNOSIS — M859 Disorder of bone density and structure, unspecified: Secondary | ICD-10-CM | POA: Diagnosis not present

## 2016-03-16 DIAGNOSIS — Z1231 Encounter for screening mammogram for malignant neoplasm of breast: Secondary | ICD-10-CM | POA: Diagnosis not present

## 2016-03-23 ENCOUNTER — Other Ambulatory Visit (HOSPITAL_COMMUNITY): Payer: Self-pay | Admitting: *Deleted

## 2016-03-26 ENCOUNTER — Ambulatory Visit (HOSPITAL_COMMUNITY)
Admission: RE | Admit: 2016-03-26 | Discharge: 2016-03-26 | Disposition: A | Discharge status: Home / Self Care | Payer: PPO | Source: Ambulatory Visit | Attending: Geriatric Medicine | Admitting: Geriatric Medicine

## 2016-03-26 DIAGNOSIS — M810 Age-related osteoporosis without current pathological fracture: Secondary | ICD-10-CM | POA: Diagnosis not present

## 2016-03-26 MED ORDER — ZOLEDRONIC ACID 5 MG/100ML IV SOLN
5.0000 mg | Freq: Once | INTRAVENOUS | Status: AC
  Administered 2016-03-26: 5 mg via INTRAVENOUS

## 2016-03-26 MED ORDER — ZOLEDRONIC ACID 5 MG/100ML IV SOLN
INTRAVENOUS | Status: AC
  Filled 2016-03-26: qty 100

## 2016-03-30 ENCOUNTER — Telehealth: Payer: Self-pay | Admitting: Allergy and Immunology

## 2016-03-30 MED ORDER — OMEPRAZOLE 20 MG PO CPDR: DELAYED_RELEASE_CAPSULE | ORAL | 3 refills | Status: DC

## 2016-03-30 NOTE — Telephone Encounter (Signed)
Pt called and needs a refill on omeprazole 20mg  3x aday. Dr Lucie Leather up it so she running out to soon.  [phone]

## 2016-03-30 NOTE — Telephone Encounter (Signed)
Sent scripts into pharmacy.  

## 2016-04-11 ENCOUNTER — Other Ambulatory Visit: Payer: Self-pay | Admitting: *Deleted

## 2016-04-11 MED ORDER — RANITIDINE HCL 300 MG PO TABS: ORAL_TABLET | ORAL | 0 refills | Status: DC

## 2016-04-12 DIAGNOSIS — L989 Disorder of the skin and subcutaneous tissue, unspecified: Secondary | ICD-10-CM | POA: Diagnosis not present

## 2016-05-15 ENCOUNTER — Other Ambulatory Visit: Payer: Self-pay

## 2016-05-15 MED ORDER — BUDESONIDE-FORMOTEROL FUMARATE 160-4.5 MCG/ACT IN AERO: INHALATION_SPRAY | RESPIRATORY_TRACT | 0 refills | Status: DC

## 2016-05-31 DIAGNOSIS — Z23 Encounter for immunization: Secondary | ICD-10-CM | POA: Diagnosis not present

## 2016-06-12 ENCOUNTER — Ambulatory Visit (INDEPENDENT_AMBULATORY_CARE_PROVIDER_SITE_OTHER): Payer: PPO | Admitting: Orthopedic Surgery

## 2016-06-12 DIAGNOSIS — M25562 Pain in left knee: Secondary | ICD-10-CM | POA: Diagnosis not present

## 2016-06-12 DIAGNOSIS — M542 Cervicalgia: Secondary | ICD-10-CM

## 2016-06-13 DIAGNOSIS — L729 Follicular cyst of the skin and subcutaneous tissue, unspecified: Secondary | ICD-10-CM | POA: Diagnosis not present

## 2016-06-13 DIAGNOSIS — L720 Epidermal cyst: Secondary | ICD-10-CM | POA: Diagnosis not present

## 2016-06-13 DIAGNOSIS — L308 Other specified dermatitis: Secondary | ICD-10-CM | POA: Diagnosis not present

## 2016-06-26 DIAGNOSIS — H401213 Low-tension glaucoma, right eye, severe stage: Secondary | ICD-10-CM | POA: Diagnosis not present

## 2016-06-26 DIAGNOSIS — H01001 Unspecified blepharitis right upper eyelid: Secondary | ICD-10-CM | POA: Diagnosis not present

## 2016-06-26 DIAGNOSIS — H2513 Age-related nuclear cataract, bilateral: Secondary | ICD-10-CM | POA: Diagnosis not present

## 2016-06-26 DIAGNOSIS — H43813 Vitreous degeneration, bilateral: Secondary | ICD-10-CM | POA: Diagnosis not present

## 2016-07-17 ENCOUNTER — Other Ambulatory Visit: Payer: Self-pay | Admitting: *Deleted

## 2016-07-17 MED ORDER — RANITIDINE HCL 300 MG PO TABS: ORAL_TABLET | ORAL | 0 refills | Status: DC

## 2016-08-14 ENCOUNTER — Encounter: Payer: Self-pay | Admitting: Allergy and Immunology

## 2016-08-14 ENCOUNTER — Ambulatory Visit (INDEPENDENT_AMBULATORY_CARE_PROVIDER_SITE_OTHER): Payer: PPO | Admitting: Allergy and Immunology

## 2016-08-14 VITALS — BP 108/62 | HR 76 | Resp 16

## 2016-08-14 DIAGNOSIS — J4540 Moderate persistent asthma, uncomplicated: Secondary | ICD-10-CM

## 2016-08-14 DIAGNOSIS — J309 Allergic rhinitis, unspecified: Secondary | ICD-10-CM

## 2016-08-14 DIAGNOSIS — B370 Candidal stomatitis: Secondary | ICD-10-CM | POA: Diagnosis not present

## 2016-08-14 DIAGNOSIS — H1010 Acute atopic conjunctivitis, unspecified eye: Secondary | ICD-10-CM

## 2016-08-14 DIAGNOSIS — K219 Gastro-esophageal reflux disease without esophagitis: Secondary | ICD-10-CM | POA: Diagnosis not present

## 2016-08-14 MED ORDER — NYSTATIN 100000 UNIT/ML MT SUSP: 5.0000 mL | Freq: Every morning | OROMUCOSAL | 4 refills | Status: DC

## 2016-08-14 MED ORDER — NYSTATIN 100000 UNIT/GM EX POWD: CUTANEOUS | 5 refills | Status: DC

## 2016-08-14 MED ORDER — FLUCONAZOLE 100 MG PO TABS: ORAL_TABLET | ORAL | 5 refills | Status: DC

## 2016-08-14 NOTE — Progress Notes (Signed)
Follow-up Note  Referring Provider: Merlene LaughterStoneking, Hal, MD Primary Provider: Ginette OttoSTONEKING,HAL THOMAS, MD Date of Office Visit: 08/14/2016  Subjective:   Victoria Trevino (DOB: 07/17/1941) is a 75 y.o. female who [facility] in re-evaluation of the following:  HPI: Cobb [name] returns to this clinic in reevaluation of her asthma and allergic rhinitis and LPR and recurrent thrush. I last saw her in this clinic in June 2017.  Her asthma has been under relatively good control and she does not use a bronchodilator very often and has not required a systemic steroid to treat an exacerbation since I've last seen her in his clinic. However, if she tapers down to Symbicort one time per day she definitely develops a problem associated with wheezing and coughing and shortness of breath. At twice a day use of Symbicort she believes that she is doing relatively well.  Her nose has not really been causing her much problem. She has not required an antibiotic to treat an episode of sinusitis.  Her reflux is under very good control on her current medical therapy.  However, the big problem for [name] today is the fact that she has been getting recurrent problems with thrush and recurrent laryngitis. She will take a Diflucan and she gets pretty good control over 10 days or so and then her situation deteriorates. She is using a spacer on her Symbicort at this point in time. She has had past evaluation with an ENT doctor for her laryngitis although her last appointment was approximately 10 years ago    Medication List      antiseptic oral rinse Liqd 15 mLs by Mouth Rinse route 2 (two) times daily.   budesonide-formoterol 160-4.5 MCG/ACT inhaler Commonly known as:  SYMBICORT INHALE TWO PUFFS TWICE DAILY TO PREVENT COUGH OR WHEEZE. RINSE MOUTH AFTER USE.   CALCIUM + D [phone] MG-UNT-MCG Chew Generic drug:  Calcium-Vitamin D-Vitamin K Chew 1 tablet by mouth 2 (two) times  daily. With lunch and dinner   cetirizine 10 MG tablet Commonly known as:  ZYRTEC Take 1 tablet (10 mg total) by mouth daily.   ciclesonide 160 MCG/ACT inhaler Commonly known as:  ALVESCO Inhale 1 puff into the lungs See admin instructions. Use only when having an asthma attack   Fish Oil 1000 MG Caps Take 1,000 mg by mouth daily.   fluconazole 100 MG tablet Commonly known as:  DIFLUCAN Take 150 mg by mouth as needed (once a month for 12 months).   fluticasone 50 MCG/ACT nasal spray Commonly known as:  FLONASE Place 2 sprays into both nostrils daily.   LUMIGAN 0.01 % Soln Generic drug:  bimatoprost Place 1 drop into both eyes daily.   multivitamin with minerals Tabs tablet Take 1 tablet by mouth daily.   omeprazole 20 MG capsule Commonly known as:  PRILOSEC TAKE ONE CAPSULE TWICE DAILY   PROAIR HFA 108 (90 Base) MCG/ACT inhaler Generic drug:  albuterol Inhale two puffs every four to six hours as needed for cough or wheeze.   ranitidine 300 MG tablet Commonly known as:  ZANTAC TAKE ONE TABLET EACH EVENING   RECLAST 5 MG/100ML Soln injection Generic drug:  zoledronic acid Inject 5 mg into the vein once. Once a year.   SIMPLY SALINE 0.9 % Aers Generic drug:  Saline Place 2 sprays into the nose daily.   Vitamin D-3 1000 units Caps Take 1,000 Units by mouth daily.       Past  Medical History:  Diagnosis Date  . Asthma   . GERD (gastroesophageal reflux disease)     Past Surgical History:  Procedure Laterality Date  . Cervical decompression and fusion  1997  . ECTOPIC PREGNANCY SURGERY    . KNEE SURGERY    . nissan infundibulm    . THROAT SURGERY      No Known Allergies  Review of systems negative except as noted in HPI / PMHx or noted below:  Review of Systems  Constitutional: Negative.   HENT: Negative.   Eyes: Negative.   Respiratory: Negative.   Cardiovascular: Negative.   Gastrointestinal: Negative.   Genitourinary: Negative.     Musculoskeletal: Negative.   Skin: Negative.   Neurological: Negative.   Endo/Heme/Allergies: Negative.   Psychiatric/Behavioral: Negative.      Objective:   Vitals:   08/14/16 1010  BP: 108/62  Pulse: 76  Resp: 16          Physical Exam  Constitutional: She is well-developed, well-nourished, and in no distress.  Slightly raspy voice  HENT:  Head: Normocephalic.  Right Ear: Tympanic membrane, external ear and ear canal normal.  Left Ear: Tympanic membrane, external ear and ear canal normal.  Nose: Nose normal. No mucosal edema or rhinorrhea.  Mouth/Throat: Uvula is midline, oropharynx is clear and moist and mucous membranes are normal. No oropharyngeal exudate.  Thrush  Eyes: Conjunctivae are normal.  Neck: Trachea normal. No tracheal tenderness present. No tracheal deviation present. No thyromegaly present.  Cardiovascular: Normal rate, regular rhythm, S1 normal, S2 normal and normal heart sounds.   No murmur heard. Pulmonary/Chest: Breath sounds normal. No stridor. No respiratory distress. She has no wheezes. She has no rales.  Musculoskeletal: She exhibits no edema.  Lymphadenopathy:       Head (right side): No tonsillar adenopathy present.       Head (left side): No tonsillar adenopathy present.    She has no cervical adenopathy.  Neurological: She is alert. Gait normal.  Skin: No rash noted. She is not diaphoretic. No erythema. Nails show no clubbing.  Psychiatric: Mood and affect normal.    Diagnostics:    Spirometry was performed and demonstrated an FEV1 of 2.02 at 83 % of predicted.  The patient had an Asthma Control Test with the following results:  .    Assessment and Plan:   1. Moderate persistent asthma, uncomplicated   2. Allergic rhinoconjunctivitis   3. LPRD (laryngopharyngeal reflux disease)   4. Thrush, oral     1. Continue Symbicort 160 2 inhalations twice a day and add Alvesco 160 2 inhalations twice a day during 'flare up'  2. Continue  omeprazole 20 mg twice a day and ranitidine 300 mg in the evening  3. Continue nasal fluticasone one spray each nostril one time per day  4. May use OTC Mucinex DM, Zyrtec, and ProAir HFA as needed  5. Treat oral fungal overgrowth situation:    A. Diflucan 100 one tablet every 2 weeks.  B. nystatin oral solution 1 teaspoon swish and swallow every morning  6. Return to clinic in 6 months or earlier if problem  I will Chantella continue to utilize the therapy mentioned above which includes anti-inflammatory agents for both her upper and lower airway and aggressive therapy directed against reflux and we will get a little bit more aggressive about treating her fungal overgrowth issue with the therapy mentioned above. If she does well I will see her back in this clinic in 6 months  or earlier if there is a problem.  Laurette SchimkeEric Chiquita Heckert, MD [facility]

## 2016-08-14 NOTE — Patient Instructions (Addendum)
   1. Continue Symbicort 160 2 inhalations twice a day and add Alvesco 160 2 inhalations twice a day during 'flare up'  2. Continue omeprazole 20 mg twice a day and ranitidine 300 mg in the evening  3. Continue nasal fluticasone one spray each nostril one time per day  4. May use OTC Mucinex DM, Zyrtec, and ProAir HFA as needed  5. Treat oral fungal overgrowth situation:    A. Diflucan 100 one tablet every 2 weeks.  B. nystatin oral solution 1 teaspoon swish and swallow every morning  6. Return to clinic in 6 months or earlier if problem

## 2016-08-15 ENCOUNTER — Telehealth: Payer: Self-pay | Admitting: Allergy and Immunology

## 2016-08-15 NOTE — Telephone Encounter (Signed)
Charlie at [facility] called asking for clarification on directions for use on a swish and swallow. She was called in a powder form and was instructed to swish and swallow but no instructions on how to mix it.  They have a liquid form if you would rather order that. Please call them to give instructions.

## 2016-08-15 NOTE — Telephone Encounter (Signed)
CALLED AND CLARIFIED RX

## 2016-08-16 ENCOUNTER — Telehealth: Payer: Self-pay | Admitting: *Deleted

## 2016-08-16 NOTE — Telephone Encounter (Signed)
So what does that mean? What is the cost of a generic nystatin oral solution? You mentioned it is tier two. Is that correct?

## 2016-08-16 NOTE — Telephone Encounter (Signed)
I'm not really sure what products we are referring to with this message. She was to use nystatin oral solution swish and swallow 5 ML's daily. Will insurance not cover this agent? It should be a generic and relatively cheap.

## 2016-08-16 NOTE — Telephone Encounter (Signed)
I already called and clarified the correct rx yesterday

## 2016-08-16 NOTE — Telephone Encounter (Signed)
Nyamyc is not cover by patient's insurance. Insurance prefers Terbinafine - Tier 1, Ketoconzole - Tier 1, Nystatin - Tier 2 and Fluconazole - Tier 2. Please advise.

## 2016-08-16 NOTE — Telephone Encounter (Signed)
Script was verified with pharmacy yesterday.

## 2016-08-20 ENCOUNTER — Other Ambulatory Visit: Payer: Self-pay | Admitting: *Deleted

## 2016-08-20 MED ORDER — RANITIDINE HCL 300 MG PO TABS: ORAL_TABLET | ORAL | 5 refills | Status: DC

## 2016-08-29 ENCOUNTER — Other Ambulatory Visit: Payer: Self-pay

## 2016-08-29 MED ORDER — BUDESONIDE-FORMOTEROL FUMARATE 160-4.5 MCG/ACT IN AERO: INHALATION_SPRAY | RESPIRATORY_TRACT | 0 refills | Status: DC

## 2016-08-29 NOTE — Telephone Encounter (Signed)
Fax request for refill on Symbicort. Will send to pharmacy.

## 2016-10-24 DIAGNOSIS — H401213 Low-tension glaucoma, right eye, severe stage: Secondary | ICD-10-CM | POA: Diagnosis not present

## 2016-11-26 ENCOUNTER — Other Ambulatory Visit: Payer: Self-pay

## 2016-11-26 MED ORDER — BUDESONIDE-FORMOTEROL FUMARATE 160-4.5 MCG/ACT IN AERO: INHALATION_SPRAY | RESPIRATORY_TRACT | 0 refills | Status: DC

## 2016-12-14 ENCOUNTER — Other Ambulatory Visit: Payer: Self-pay | Admitting: Geriatric Medicine

## 2016-12-14 DIAGNOSIS — Z1231 Encounter for screening mammogram for malignant neoplasm of breast: Secondary | ICD-10-CM

## 2017-01-04 ENCOUNTER — Encounter (INDEPENDENT_AMBULATORY_CARE_PROVIDER_SITE_OTHER): Payer: Self-pay | Admitting: Orthopedic Surgery

## 2017-01-04 ENCOUNTER — Ambulatory Visit (INDEPENDENT_AMBULATORY_CARE_PROVIDER_SITE_OTHER): Payer: PPO | Admitting: Orthopedic Surgery

## 2017-01-04 ENCOUNTER — Ambulatory Visit (INDEPENDENT_AMBULATORY_CARE_PROVIDER_SITE_OTHER): Payer: PPO

## 2017-01-04 DIAGNOSIS — S6992XA Unspecified injury of left wrist, hand and finger(s), initial encounter: Secondary | ICD-10-CM

## 2017-01-04 NOTE — Progress Notes (Signed)
Office Visit Note   Patient: Victoria Trevino           Date of Birth: 05/30/1941           MRN: [id number] Visit Date: 01/04/2017 Requested by: Merlene LaughterHal Stoneking, MD [address] [place], [place] [address] PCP: Ginette OttoSTONEKING,HAL THOMAS, MD  Subjective: Chief Complaint  Patient presents with  . Left Wrist - Injury    HPI: Victoria Verde [name] is a 76 year old right-hand-dominant patient who fell 01/03/2017 and landed on her outstretched left wrist.  She reports painful wrist with development of swelling.  She states, for pain.  She does have a history of osteopenia for which she takes re-clast once a year.  She denies any elbow or shoulder pain.              ROS: All systems reviewed are negative as they relate to the chief complaint within the history of present illness.  Patient denies  fevers or chills.   Assessment & Plan: Visit Diagnoses:  1. Injury of left wrist, initial encounter     Plan: Impression is left wrist injury with swelling but no definite fracture on plain radiographs.  Plan is for wrist splint immobilization and return visit in 2 weeks.  Would likely repeat radiographs at that time.  No lifting with the left arm until I see her back  Follow-Up Instructions: Return in about 2 weeks (around 01/18/2017).   Orders:  Orders Placed This Encounter  Procedures  . XR Wrist Complete Left   No orders of the defined types were placed in this encounter.     Procedures: No procedures performed   Clinical Data: No additional findings.  Objective: Vital Signs: There were no vitals taken for this visit.  Physical Exam:   Constitutional: Patient appears well-developed HEENT:  Head: Normocephalic Eyes:EOM are normal Neck: Normal range of motion Cardiovascular: Normal rate Pulmonary/chest: Effort normal Neurologic: Patient is alert Skin: Skin is warm Psychiatric: Patient has normal mood and affect    Ortho Exam: Orthopedic exam demonstrates swelling and mild erythema  around the radial aspect of the wrist.  EPL FPL interosseous function is intact.  No pain with range of motion of the left elbow.  Radial pulses intact.  Does have pain with attempted flexion and extension of the wrist.  Specialty Comments:  No specialty comments available.  Imaging: Xr Wrist Complete Left  Result Date: 01/04/2017 AP lateral oblique and scaphoid view of the left wrist are reviewed.  No discrete fracture is visualized.  Mild degenerative changes present.    PMFS History: Patient Active Problem List   Diagnosis Date Noted  . Hereditary and idiopathic peripheral neuropathy 02/16/2016  . LPRD (laryngopharyngeal reflux disease) 08/02/2015  . Allergic rhinoconjunctivitis 08/02/2015  . Moderate persistent asthma 08/02/2015   Past Medical History:  Diagnosis Date  . Asthma   . GERD (gastroesophageal reflux disease)     Family History  Problem Relation Age of Onset  . Alzheimer's disease Mother     Deceased, [id number]  . Heart attack Father   . Congestive Heart Failure Father     Deceased, 4678  . Emphysema Father   . Skin cancer Sister   . Allergic rhinitis Sister   . Aneurysm Brother   . Skin cancer Brother   . Stroke Maternal Grandmother   . Prostate cancer Maternal Grandfather     Past Surgical History:  Procedure Laterality Date  . Cervical decompression and fusion  1997  . ECTOPIC PREGNANCY  SURGERY    . KNEE SURGERY    . nissan infundibulm    . THROAT SURGERY     Social History   Occupational History  . Not on file.   Social History Main Topics  . Smoking status: Never Smoker  . Smokeless tobacco: Never Used  . Alcohol use No  . Drug use: No  . Sexual activity: Yes    Birth control/ protection: Condom

## 2017-01-08 ENCOUNTER — Ambulatory Visit
Admission: RE | Admit: 2017-01-08 | Discharge: 2017-01-08 | Disposition: A | Discharge status: Home / Self Care | Payer: PPO | Source: Ambulatory Visit | Attending: Geriatric Medicine | Admitting: Geriatric Medicine

## 2017-01-08 ENCOUNTER — Encounter (INDEPENDENT_AMBULATORY_CARE_PROVIDER_SITE_OTHER): Payer: Self-pay

## 2017-01-08 DIAGNOSIS — Z1231 Encounter for screening mammogram for malignant neoplasm of breast: Secondary | ICD-10-CM

## 2017-01-09 ENCOUNTER — Other Ambulatory Visit: Payer: Self-pay

## 2017-01-09 ENCOUNTER — Other Ambulatory Visit: Payer: Self-pay | Admitting: Geriatric Medicine

## 2017-01-09 DIAGNOSIS — R928 Other abnormal and inconclusive findings on diagnostic imaging of breast: Secondary | ICD-10-CM

## 2017-01-09 MED ORDER — FLUTICASONE PROPIONATE 50 MCG/ACT NA SUSP: NASAL | 0 refills | Status: DC

## 2017-01-09 MED ORDER — NYSTATIN 100000 UNIT/ML MT SUSP: OROMUCOSAL | 0 refills | Status: DC

## 2017-01-09 MED ORDER — BUDESONIDE-FORMOTEROL FUMARATE 160-4.5 MCG/ACT IN AERO: INHALATION_SPRAY | RESPIRATORY_TRACT | 0 refills | Status: DC

## 2017-01-09 MED ORDER — CICLESONIDE 160 MCG/ACT IN AERS: INHALATION_SPRAY | RESPIRATORY_TRACT | 0 refills | Status: DC

## 2017-01-09 MED ORDER — RANITIDINE HCL 300 MG PO TABS: ORAL_TABLET | ORAL | 0 refills | Status: DC

## 2017-01-09 MED ORDER — ALBUTEROL SULFATE HFA 108 (90 BASE) MCG/ACT IN AERS: INHALATION_SPRAY | RESPIRATORY_TRACT | 0 refills | Status: DC

## 2017-01-09 MED ORDER — OMEPRAZOLE 20 MG PO CPDR: DELAYED_RELEASE_CAPSULE | ORAL | 0 refills | Status: DC

## 2017-01-09 MED ORDER — FLUCONAZOLE 100 MG PO TABS: ORAL_TABLET | ORAL | 0 refills | Status: DC

## 2017-01-09 NOTE — Telephone Encounter (Signed)
Patient requests printed Rx for all her prescriptions.  She is going out of town and wants printed Rx in case she loses any of her medications and needs to get a refill.  Ok per Dr.Kozlow. I will print out Rx and call patient to pick them up.

## 2017-01-11 ENCOUNTER — Ambulatory Visit
Admission: RE | Admit: 2017-01-11 | Discharge: 2017-01-11 | Disposition: A | Discharge status: Home / Self Care | Payer: PPO | Source: Ambulatory Visit | Attending: Geriatric Medicine | Admitting: Geriatric Medicine

## 2017-01-11 DIAGNOSIS — R922 Inconclusive mammogram: Secondary | ICD-10-CM | POA: Diagnosis not present

## 2017-01-11 DIAGNOSIS — N6489 Other specified disorders of breast: Secondary | ICD-10-CM | POA: Diagnosis not present

## 2017-01-11 DIAGNOSIS — R928 Other abnormal and inconclusive findings on diagnostic imaging of breast: Secondary | ICD-10-CM

## 2017-01-15 DIAGNOSIS — Z79899 Other long term (current) drug therapy: Secondary | ICD-10-CM | POA: Diagnosis not present

## 2017-01-15 DIAGNOSIS — Z1389 Encounter for screening for other disorder: Secondary | ICD-10-CM | POA: Diagnosis not present

## 2017-01-15 DIAGNOSIS — G629 Polyneuropathy, unspecified: Secondary | ICD-10-CM | POA: Diagnosis not present

## 2017-01-15 DIAGNOSIS — M8580 Other specified disorders of bone density and structure, unspecified site: Secondary | ICD-10-CM | POA: Diagnosis not present

## 2017-01-15 DIAGNOSIS — Z0000 Encounter for general adult medical examination without abnormal findings: Secondary | ICD-10-CM | POA: Diagnosis not present

## 2017-01-18 ENCOUNTER — Encounter (INDEPENDENT_AMBULATORY_CARE_PROVIDER_SITE_OTHER): Payer: Self-pay | Admitting: Orthopedic Surgery

## 2017-01-18 ENCOUNTER — Ambulatory Visit (INDEPENDENT_AMBULATORY_CARE_PROVIDER_SITE_OTHER): Payer: PPO | Admitting: Orthopedic Surgery

## 2017-01-18 DIAGNOSIS — S6992XA Unspecified injury of left wrist, hand and finger(s), initial encounter: Secondary | ICD-10-CM

## 2017-01-18 DIAGNOSIS — S6992XD Unspecified injury of left wrist, hand and finger(s), subsequent encounter: Secondary | ICD-10-CM

## 2017-01-18 HISTORY — DX: Unspecified injury of left wrist, hand and finger(s), initial encounter: S69.92XA

## 2017-01-18 NOTE — Progress Notes (Signed)
Office Visit Note   Patient: Victoria Trevino           Date of Birth: [birth year]           MRN: [id number] Visit Date: 01/18/2017 Requested by: Merlene Laughter, MD [address] [place], [place] [address] PCP: Merlene Laughter, MD  Subjective: Chief Complaint  Patient presents with  . Left Wrist - Follow-up    HPI: Victoria Trevino is a 40 show patient with left wrist pain.  Date of injury 01/03/2017.  She's been wearing the splint.  She is doing better.  She describes increased range of motion.  She is only tender with certain ranges of motion.              ROS: All systems reviewed are negative as they relate to the chief complaint within the history of present illness.  Patient denies  fevers or chills.   Assessment & Plan: Visit Diagnoses:  1. Injury of left wrist, subsequent encounter     Plan: I think he impression is left wrist pain with resolving ecchymosis still a little warmth and what appears to be post injury healing going on.  I reviewed the radiographs and although a definitive fracture is not present there is slight amount of buckling on that radial cortex of the distal radius.  This is where her area of maximal tenderness is.  I think this likely represents a nondisplaced fracture which is in the process of healing.  Plan at this time is to continue her in the splint for 2 more weeks but doing range of motion exercises 3-4 times a day.  Discontinue the splint after 2 weeks.  I want her to be careful with lifting for the following 2 weeks after discontinuing the splint.  I'll see her back as needed  Follow-Up Instructions: Return if symptoms worsen or fail to improve.   Orders:  No orders of the defined types were placed in this encounter.  No orders of the defined types were placed in this encounter.     Procedures: No procedures performed   Clinical Data: No additional findings.  Objective: Vital Signs: There were no vitals taken for this visit.  Physical  Exam:   Constitutional: Patient appears well-developed HEENT:  Head: Normocephalic Eyes:EOM are normal Neck: Normal range of motion Cardiovascular: Normal rate Pulmonary/chest: Effort normal Neurologic: Patient is alert Skin: Skin is warm Psychiatric: Patient has normal mood and affect    Ortho Exam: Orthopedic exam demonstrates full active and passive range of motion of the right wrist but the left wrist is about 50% limited.  There is no snuffbox tenderness.  Left wrist is warm and slightly swollen compared to the right wrist.  This is very subtle but present.  Finger flexion-extension is intact.  Thumb extension and flexion is intact on the left-hand side.  Radial pulses intact.  No skin temperature differences other than slight warmth left versus right around the distal radius.  Specialty Comments:  No specialty comments available.  Imaging: No results found.   PMFS History: Patient Active Problem List   Diagnosis Date Noted  . Left wrist injury 01/18/2017  . Hereditary and idiopathic peripheral neuropathy 02/16/2016  . LPRD (laryngopharyngeal reflux disease) 08/02/2015  . Allergic rhinoconjunctivitis 08/02/2015  . Moderate persistent asthma 08/02/2015   Past Medical History:  Diagnosis Date  . Asthma   . GERD (gastroesophageal reflux disease)     Family History  Problem Relation Age of Onset  .  Alzheimer's disease Mother        Deceased, 4105  . Heart attack Father   . Congestive Heart Failure Father        Deceased, 7078  . Emphysema Father   . Skin cancer Sister   . Allergic rhinitis Sister   . Aneurysm Brother   . Skin cancer Brother   . Stroke Maternal Grandmother   . Prostate cancer Maternal Grandfather     Past Surgical History:  Procedure Laterality Date  . Cervical decompression and fusion  1997  . ECTOPIC PREGNANCY SURGERY    . KNEE SURGERY    . nissan infundibulm    . THROAT SURGERY     Social History   Occupational History  . Not on file.     Social History Main Topics  . Smoking status: Never Smoker  . Smokeless tobacco: Never Used  . Alcohol use No  . Drug use: No  . Sexual activity: Yes    Birth control/ protection: Condom

## 2017-02-06 ENCOUNTER — Other Ambulatory Visit: Payer: Self-pay

## 2017-02-06 MED ORDER — FLUCONAZOLE 100 MG PO TABS: ORAL_TABLET | ORAL | 0 refills | Status: DC

## 2017-02-06 MED ORDER — NYSTATIN 100000 UNIT/ML MT SUSP: OROMUCOSAL | 0 refills | Status: DC

## 2017-02-12 ENCOUNTER — Encounter: Payer: Self-pay | Admitting: Allergy and Immunology

## 2017-02-12 ENCOUNTER — Ambulatory Visit (INDEPENDENT_AMBULATORY_CARE_PROVIDER_SITE_OTHER): Payer: PPO | Admitting: Allergy and Immunology

## 2017-02-12 VITALS — BP 104/56 | HR 60 | Resp 16

## 2017-02-12 DIAGNOSIS — H1045 Other chronic allergic conjunctivitis: Secondary | ICD-10-CM | POA: Diagnosis not present

## 2017-02-12 DIAGNOSIS — J4540 Moderate persistent asthma, uncomplicated: Secondary | ICD-10-CM | POA: Diagnosis not present

## 2017-02-12 DIAGNOSIS — H1010 Acute atopic conjunctivitis, unspecified eye: Secondary | ICD-10-CM

## 2017-02-12 DIAGNOSIS — K219 Gastro-esophageal reflux disease without esophagitis: Secondary | ICD-10-CM | POA: Diagnosis not present

## 2017-02-12 DIAGNOSIS — B370 Candidal stomatitis: Secondary | ICD-10-CM | POA: Diagnosis not present

## 2017-02-12 DIAGNOSIS — J3089 Other allergic rhinitis: Secondary | ICD-10-CM | POA: Diagnosis not present

## 2017-02-12 MED ORDER — BUDESONIDE-FORMOTEROL FUMARATE 160-4.5 MCG/ACT IN AERO: INHALATION_SPRAY | RESPIRATORY_TRACT | 5 refills | Status: DC

## 2017-02-12 MED ORDER — FLUTICASONE PROPIONATE 50 MCG/ACT NA SUSP: NASAL | 5 refills | Status: DC

## 2017-02-12 MED ORDER — ALBUTEROL SULFATE HFA 108 (90 BASE) MCG/ACT IN AERS: INHALATION_SPRAY | RESPIRATORY_TRACT | 3 refills | Status: DC

## 2017-02-12 MED ORDER — RANITIDINE HCL 300 MG PO TABS: ORAL_TABLET | ORAL | 5 refills | Status: DC

## 2017-02-12 MED ORDER — OMEPRAZOLE 20 MG PO CPDR: DELAYED_RELEASE_CAPSULE | ORAL | 5 refills | Status: DC

## 2017-02-12 NOTE — Progress Notes (Signed)
Follow-up Note  Referring Provider: Merlene LaughterStoneking, Hal, MD Primary Provider: Merlene LaughterStoneking, Hal, MD Date of Office Visit: 02/12/2017  Subjective:   Gloriajean DellLois J Henri (DOB: 05/22/1941) is a 76 y.o. female who [facility] in re-evaluation of the following:  HPI: Helena [name] presents to this clinic in reevaluation of her asthma and allergic rhinitis and LPR and history of recurrent thrush. I last saw her in this clinic December 2017 at which point in time we assigned a plan to address each one of these issues.  Her asthma has been under excellent control and she has not required a systemic steroid or activation of her action plan to treat an exacerbation and rarely uses a short acting bronchodilator. She does exercise at the gym about twice a week and does occasionally become short of breath when she performs exercise and her recovery time is about 1 minute.  She has had very little issues with her nose and has not required an antibiotic to treat any type of respiratory tract infection.  Her reflux is under excellent control at this point in time.  She believes that the issue with recurrent thrush precipitated by her inhaled steroid is under excellent control with her current plan which includes a combination of nystatin every morning and a Diflucan every 2 weeks.  She had fracture of her wrist that did not require surgery this year. She does have osteopenia that is being treated with Reclast.  Her husband passed away this February from a myocardial infarction and she is going through a grieving process.  Allergies as of 02/12/2017   No Known Allergies     Medication List      albuterol 108 (90 Base) MCG/ACT inhaler Commonly known as:  PROAIR HFA Inhale two puffs every four to six hours as needed for cough or wheeze.   antiseptic oral rinse Liqd 15 mLs by Mouth Rinse route 2 (two) times daily.   budesonide-formoterol 160-4.5 MCG/ACT inhaler Commonly  known as:  SYMBICORT INHALE TWO PUFFS TWICE DAILY TO PREVENT COUGH OR WHEEZE. RINSE MOUTH AFTER USE.   CALCIUM + D [phone] MG-UNT-MCG Chew Generic drug:  Calcium-Vitamin D-Vitamin K Chew 1 tablet by mouth 2 (two) times daily. With lunch and dinner   cetirizine 10 MG tablet Commonly known as:  ZYRTEC Take 1 tablet (10 mg total) by mouth daily.   ciclesonide 160 MCG/ACT inhaler Commonly known as:  ALVESCO Inhale two puffs twice daily during flare-up.  Rinse, gargle, and spit after use.   fluconazole 100 MG tablet Commonly known as:  DIFLUCAN Take one tablet every 14 days   fluticasone 50 MCG/ACT nasal spray Commonly known as:  FLONASE Use one spray in each nostril once daily.   LUMIGAN 0.01 % Soln Generic drug:  bimatoprost Place 1 drop into both eyes daily.   multivitamin with minerals Tabs tablet Take 1 tablet by mouth daily.   nystatin 100000 UNIT/ML suspension Commonly known as:  MYCOSTATIN Use one teaspoon swish and swallow every morning.   omeprazole 20 MG capsule Commonly known as:  PRILOSEC TAKE ONE CAPSULE TWICE DAILY   ranitidine 300 MG tablet Commonly known as:  ZANTAC TAKE ONE TABLET EACH EVENING   RECLAST 5 MG/100ML Soln injection Generic drug:  zoledronic acid Inject 5 mg into the vein once. Once a year.   SIMPLY SALINE 0.9 % Aers Generic drug:  Saline Place 2 sprays into the nose daily.   TYLENOL PO Take by mouth  as needed.   Vitamin D-3 1000 units Caps Take 1,000 Units by mouth daily.       Past Medical History:  Diagnosis Date  . Asthma   . GERD (gastroesophageal reflux disease)     Past Surgical History:  Procedure Laterality Date  . Cervical decompression and fusion  1997  . ECTOPIC PREGNANCY SURGERY    . KNEE SURGERY    . nissan infundibulm    . THROAT SURGERY      Review of systems negative except as noted in HPI / PMHx or noted below:  Review of Systems  Constitutional: Negative.   HENT: Negative.   Eyes: Negative.    Respiratory: Negative.   Cardiovascular: Negative.   Gastrointestinal: Negative.   Genitourinary: Negative.   Musculoskeletal: Negative.   Skin: Negative.   Neurological: Negative.   Endo/Heme/Allergies: Negative.   Psychiatric/Behavioral: Negative.      Objective:   Vitals:   02/12/17 1103  BP: (!) 104/56  Pulse: 60  Resp: 16          Physical Exam  Constitutional: She is well-developed, well-nourished, and in no distress.  HENT:  Head: Normocephalic.  Right Ear: Tympanic membrane, external ear and ear canal normal.  Left Ear: Tympanic membrane, external ear and ear canal normal.  Nose: Nose normal. No mucosal edema or rhinorrhea.  Mouth/Throat: Uvula is midline, oropharynx is clear and moist and mucous membranes are normal. No oropharyngeal exudate.  Eyes: Conjunctivae are normal.  Neck: Trachea normal. No tracheal tenderness present. No tracheal deviation present. No thyromegaly present.  Cardiovascular: Normal rate, regular rhythm, S1 normal, S2 normal and normal heart sounds.   No murmur heard. Pulmonary/Chest: Breath sounds normal. No stridor. No respiratory distress. She has no wheezes. She has no rales.  Musculoskeletal: She exhibits no edema.  Lymphadenopathy:       Head (right side): No tonsillar adenopathy present.       Head (left side): No tonsillar adenopathy present.    She has no cervical adenopathy.  Neurological: She is alert. Gait normal.  Skin: No rash noted. She is not diaphoretic. No erythema. Nails show no clubbing.  Psychiatric: Mood and affect normal.    Diagnostics:    Spirometry was performed and demonstrated an FEV1 of 1.80 at 73 % of predicted.  The patient had an Asthma Control Test with the following results: ACT Total Score: 21.    Assessment and Plan:   1. Moderate persistent asthma, uncomplicated   2. Other allergic rhinitis   3. Seasonal allergic conjunctivitis   4. LPRD (laryngopharyngeal reflux disease)   5. Thrush,  oral     1. Continue Symbicort 160 2 inhalations twice a day and add Alvesco 160 2 inhalations twice a day during 'flare up'  2. Continue omeprazole 20 mg twice a day and ranitidine 300 mg in the evening  3. Continue nasal fluticasone one spray each nostril one time per day  4. Continue a combination of the following to address fungal disease:    A. Diflucan 100 one tablet every 2 weeks.  B. nystatin oral solution 1 teaspoon swish and swallow every morning  5. May use OTC Mucinex DM, Zyrtec, and ProAir HFA as needed  6. Return to clinic in 6 months or earlier if problem  Overall, Earlisha is doing quite well on her current medical therapy which includes anti-inflammatory medications for her respiratory tract and aggressive therapy directed against reflux and a preventative plan for her recurrent oral thrush. If she  continues to do well I will see her back in this clinic in 6 months or earlier if there is a problem. She will obtain a flu vaccine this fall.  Laurette Schimke, MD Allergy / Immunology [facility]

## 2017-02-12 NOTE — Patient Instructions (Addendum)
   1. Continue Symbicort 160 2 inhalations twice a day and add Alvesco 160 2 inhalations twice a day during 'flare up'  2. Continue omeprazole 20 mg twice a day and ranitidine 300 mg in the evening  3. Continue nasal fluticasone one spray each nostril one time per day  4. Continue a combination of the following to address fungal disease:    A. Diflucan 100 one tablet every 2 weeks.  B. nystatin oral solution 1 teaspoon swish and swallow every morning  5. May use OTC Mucinex DM, Zyrtec, and ProAir HFA as needed  6. Return to clinic in 6 months or earlier if problem 

## 2017-02-14 NOTE — Progress Notes (Signed)
Follow-up Visit   Date: 02/15/17    Victoria Trevino MRN: [id number] DOB: [birth year]   Interim History: Victoria Trevino is a 76 y.o. right-handed Caucasian female with asthma, GERD, migraine, s/p cervical decompression and fusion 10-13-95) and glaucoma returning to the clinic for follow-up of neuropathy.  The patient was accompanied to the clinic by self.  History of present illness: Initial visit 02/16/2016:  Starting around early 2000s, she noticed that the soles of her feet felt like cardboard and stiff.  Over the years, she developed similar symptoms over the dorsum of the feet and associated burning pain.  Painful paresthesias is most noticeable at night when she goes to bed, and wears socks at night because it helps.  The numbness is constant and involves the entire foot.  She endorses imbalance and lightheadedness.  She suffered a fall last year and fractured her right patella.  She walks independently. She had NCS/EMG of the lower extremities in May 2017 performed elsewhere which showed moderate to severe polyneuropathy and is referred for further evaluation.  She denies any weakness of the legs.  She has been seeing a Physiological scientist for leg strengthening since her fall.    She has no history of diabetes, alcoholism, or exposure to solvents.  There is no family history of neuropathy.    UPDATE 02/15/2017: She is here for 1 year follow-up visit and has noticed mild progression of neuropathy.  She has some tingling and numbness in the ankles and lower legs.  She has started using a personal trainer twice per week to work on balance and strength.  She is walking unassisted and suffered one fall while in the garden and fractured her left wrist.   Her husband passed away in 2016-10-12 and she is mourning his loss.  She has no new complaints today.  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   Medications:  Current Outpatient Prescriptions on File Prior to Visit    Medication Sig Dispense Refill  . Acetaminophen (TYLENOL PO) Take by mouth as needed.    Marland Kitchen albuterol (PROAIR HFA) 108 (90 Base) MCG/ACT inhaler Inhale two puffs every four to six hours as needed for cough or wheeze. 1 Inhaler 3  . antiseptic oral rinse (BIOTENE) LIQD 15 mLs by Mouth Rinse route 2 (two) times daily.    . budesonide-formoterol (SYMBICORT) 160-4.5 MCG/ACT inhaler INHALE TWO PUFFS TWICE DAILY TO PREVENT COUGH OR WHEEZE. RINSE MOUTH AFTER USE. 1 Inhaler 5  . Calcium-Vitamin D-Vitamin K (CALCIUM + D) [phone] MG-UNT-MCG CHEW Chew 1 tablet by mouth 2 (two) times daily. With lunch and dinner    . cetirizine (ZYRTEC) 10 MG tablet Take 1 tablet (10 mg total) by mouth daily. 90 tablet 3  . Cholecalciferol (VITAMIN D-3) 1000 UNITS CAPS Take 1,000 Units by mouth daily.    . ciclesonide (ALVESCO) 160 MCG/ACT inhaler Inhale two puffs twice daily during flare-up.  Rinse, gargle, and spit after use. 1 Inhaler 0  . fluconazole (DIFLUCAN) 100 MG tablet Take one tablet every 14 days 2 tablet 0  . fluticasone (FLONASE) 50 MCG/ACT nasal spray Use one spray in each nostril once daily. 16 g 5  . LUMIGAN 0.01 % SOLN Place 1 drop into both eyes daily.    . Multiple Vitamin (MULTIVITAMIN WITH MINERALS) TABS tablet Take 1 tablet by mouth daily.    Marland Kitchen nystatin (MYCOSTATIN) 100000 UNIT/ML suspension Use one teaspoon swish and swallow every morning. 150 mL 0  .  omeprazole (PRILOSEC) 20 MG capsule TAKE ONE CAPSULE TWICE DAILY 60 capsule 5  . ranitidine (ZANTAC) 300 MG tablet TAKE ONE TABLET EACH EVENING 30 tablet 5  . Saline (SIMPLY SALINE) 0.9 % AERS Place 2 sprays into the nose daily.    . zoledronic acid (RECLAST) 5 MG/100ML SOLN injection Inject 5 mg into the vein once. Once a year.     No current facility-administered medications on file prior to visit.     Allergies: No Known Allergies  Review of Systems:  CONSTITUTIONAL: No fevers, chills, night sweats, or weight loss.  EYES: No visual changes  or eye pain ENT: No hearing changes.  No history of nose bleeds.   RESPIRATORY: No cough, wheezing and shortness of breath.   CARDIOVASCULAR: Negative for chest pain, and palpitations.   GI: Negative for abdominal discomfort, blood in stools or black stools.  No recent change in bowel habits.   GU:  No history of incontinence.   MUSCLOSKELETAL: No history of joint pain or swelling.  No myalgias.   SKIN: Negative for lesions, rash, and itching.   ENDOCRINE: Negative for cold or heat intolerance, polydipsia or goiter.   PSYCH:  No depression or anxiety symptoms.   NEURO: As Above.   Vital Signs:  BP 110/70   Pulse 70   Ht _0  (1.727 m)   Wt 122 lb (55.3 kg)   SpO2 99%   BMI 18.55 kg/m   Neurological Exam: MENTAL STATUS including orientation to time, place, person, recent and remote memory, attention span and concentration, language, and fund of knowledge is normal.  Speech is not dysarthric.  CRANIAL NERVES: No visual field defects.  Pupils equal round and reactive to light.  Normal conjugate, extra-ocular eye movements in all directions of gaze.  No ptosis.  Face is symmetric. Palate elevates symmetrically.  Tongue is midline.  MOTOR:  Motor strength is 5/5 in all extremities.  No atrophy, fasciculations or abnormal movements.  No pronator drift.  Tone is normal.    MSRs:  Reflexes are 2+/4 throughout, except absent at the Achilles.  SENSORY:  Reduced vibration at the ankles and absent at the great toe bilaterally.   COORDINATION/GAIT:  Gait narrow based and stable.  She is able to perform stressed gait and there is mild unsteadiness with tandem gait (improved).  Data: Labs 02/16/2016:  ESR 11, vitamin B12 673, TSH 1.65, copper 108, SPEP with IFE no M protein  IMPRESSION/PLAN: Mrs. Burkholder is a 76 year-old female returning for evaluation of idiopathic neuropathy. Over the past year, symptoms have been mildly progressed to involve her lower leg. She has suffered one fall  complicated by left wrist fracture while in the garden.  Fall precautions were stressed.  She was encouraged to use a hiking stick or cane when walking on her property or any uneven surfaces.  She will continue to work with trainer for balance and strength training.  She had questions regarding the level of activity which she can do with her trainer, which was answered.  I also recommend she start tai chi, yoga, or water exercises to help with balance.   Return to clinic in 1 year.   The duration of this appointment visit was 25 minutes of face-to-face time with the patient.  Greater than 50% of this time was spent in counseling, explanation of diagnosis, planning of further management, and coordination of care.   Thank you for allowing me to participate in patient's care.  If I can answer  any additional questions, I would be pleased to do so.    Sincerely,    Laurna Shetley K. Posey Pronto, DO

## 2017-02-15 ENCOUNTER — Ambulatory Visit (INDEPENDENT_AMBULATORY_CARE_PROVIDER_SITE_OTHER): Payer: PPO | Admitting: Neurology

## 2017-02-15 ENCOUNTER — Encounter: Payer: Self-pay | Admitting: Neurology

## 2017-02-15 VITALS — BP 110/70 | HR 70 | Ht 68.0 in | Wt 122.0 lb

## 2017-02-15 DIAGNOSIS — G609 Hereditary and idiopathic neuropathy, unspecified: Secondary | ICD-10-CM

## 2017-02-15 NOTE — Patient Instructions (Signed)
Return to clinic in 1 year.

## 2017-03-14 ENCOUNTER — Other Ambulatory Visit: Payer: Self-pay

## 2017-03-14 MED ORDER — NYSTATIN 100000 UNIT/ML MT SUSP: OROMUCOSAL | 3 refills | Status: DC

## 2017-03-14 MED ORDER — CETIRIZINE HCL 10 MG PO TABS: 10.0000 mg | ORAL_TABLET | Freq: Every day | ORAL | 1 refills | Status: DC

## 2017-03-14 MED ORDER — FLUCONAZOLE 100 MG PO TABS: ORAL_TABLET | ORAL | 3 refills | Status: DC

## 2017-03-14 NOTE — Telephone Encounter (Signed)
Patient called stating her Pharmacy faxed over a refill request for her meds on Monday. Patient is wanting to have her Cetrizine, Diflucan and Nystatin refilled to [facility].

## 2017-03-14 NOTE — Telephone Encounter (Signed)
Left message advising medications were sent in.

## 2017-03-19 DIAGNOSIS — H401213 Low-tension glaucoma, right eye, severe stage: Secondary | ICD-10-CM | POA: Diagnosis not present

## 2017-04-04 DIAGNOSIS — M8588 Other specified disorders of bone density and structure, other site: Secondary | ICD-10-CM | POA: Diagnosis not present

## 2017-04-16 ENCOUNTER — Other Ambulatory Visit (HOSPITAL_COMMUNITY): Payer: Self-pay | Admitting: *Deleted

## 2017-04-17 ENCOUNTER — Ambulatory Visit (HOSPITAL_COMMUNITY)
Admission: RE | Admit: 2017-04-17 | Discharge: 2017-04-17 | Disposition: A | Discharge status: Home / Self Care | Payer: PPO | Source: Ambulatory Visit | Attending: Geriatric Medicine | Admitting: Geriatric Medicine

## 2017-04-17 DIAGNOSIS — M810 Age-related osteoporosis without current pathological fracture: Secondary | ICD-10-CM | POA: Insufficient documentation

## 2017-04-17 MED ORDER — ZOLEDRONIC ACID 5 MG/100ML IV SOLN
5.0000 mg | Freq: Once | INTRAVENOUS | Status: AC
  Administered 2017-04-17: 5 mg via INTRAVENOUS

## 2017-04-17 MED ORDER — ZOLEDRONIC ACID 5 MG/100ML IV SOLN
INTRAVENOUS | Status: AC
  Administered 2017-04-17: 5 mg via INTRAVENOUS
  Filled 2017-04-17: qty 100

## 2017-06-05 DIAGNOSIS — Z23 Encounter for immunization: Secondary | ICD-10-CM | POA: Diagnosis not present

## 2017-06-10 ENCOUNTER — Other Ambulatory Visit: Payer: Self-pay | Admitting: Geriatric Medicine

## 2017-06-10 ENCOUNTER — Telehealth: Payer: Self-pay | Admitting: Allergy and Immunology

## 2017-06-10 DIAGNOSIS — N6489 Other specified disorders of breast: Secondary | ICD-10-CM

## 2017-06-10 MED ORDER — BUDESONIDE-FORMOTEROL FUMARATE 160-4.5 MCG/ACT IN AERO: INHALATION_SPRAY | RESPIRATORY_TRACT | 2 refills | Status: DC

## 2017-06-10 NOTE — Telephone Encounter (Signed)
Pt called and made appointment for dec. But needs to have symbicort 160 sent to mail order pharmacy [facility]. [phone].

## 2017-07-02 ENCOUNTER — Telehealth: Payer: Self-pay | Admitting: Allergy and Immunology

## 2017-07-02 MED ORDER — BUDESONIDE-FORMOTEROL FUMARATE 160-4.5 MCG/ACT IN AERO: INHALATION_SPRAY | RESPIRATORY_TRACT | 0 refills | Status: DC

## 2017-07-02 NOTE — Telephone Encounter (Signed)
Pt called a wanted her rx symibort  be sent to [facility] mail order. There number is [phone]. [phone].

## 2017-07-12 ENCOUNTER — Telehealth: Payer: Self-pay

## 2017-07-12 MED ORDER — FLUCONAZOLE 100 MG PO TABS: ORAL_TABLET | ORAL | 0 refills | Status: DC

## 2017-07-12 NOTE — Telephone Encounter (Signed)
Pt's pharmacy sent us a refill request for Diflucan 100 mg, take 1 tablet every 14 days. Please advise. I see that she has had problems with thrush according to your last OV note..Marland Kitchen

## 2017-07-12 NOTE — Telephone Encounter (Signed)
Medication sent into the pharmacy. 

## 2017-07-12 NOTE — Telephone Encounter (Signed)
Routing back to [place].

## 2017-07-12 NOTE — Telephone Encounter (Signed)
OK to refill

## 2017-07-15 DIAGNOSIS — H2513 Age-related nuclear cataract, bilateral: Secondary | ICD-10-CM | POA: Diagnosis not present

## 2017-07-15 DIAGNOSIS — H43813 Vitreous degeneration, bilateral: Secondary | ICD-10-CM | POA: Diagnosis not present

## 2017-07-15 DIAGNOSIS — H524 Presbyopia: Secondary | ICD-10-CM | POA: Diagnosis not present

## 2017-07-15 DIAGNOSIS — H401213 Low-tension glaucoma, right eye, severe stage: Secondary | ICD-10-CM | POA: Diagnosis not present

## 2017-07-16 ENCOUNTER — Ambulatory Visit
Admission: RE | Admit: 2017-07-16 | Discharge: 2017-07-16 | Disposition: A | Discharge status: Home / Self Care | Payer: PPO | Source: Ambulatory Visit | Attending: Geriatric Medicine | Admitting: Geriatric Medicine

## 2017-07-16 DIAGNOSIS — N6489 Other specified disorders of breast: Secondary | ICD-10-CM

## 2017-07-16 DIAGNOSIS — R928 Other abnormal and inconclusive findings on diagnostic imaging of breast: Secondary | ICD-10-CM | POA: Diagnosis not present

## 2017-08-07 ENCOUNTER — Encounter: Payer: Self-pay | Admitting: Allergy and Immunology

## 2017-08-07 ENCOUNTER — Ambulatory Visit: Payer: PPO | Admitting: Allergy and Immunology

## 2017-08-07 VITALS — BP 112/68 | HR 67 | Resp 17

## 2017-08-07 DIAGNOSIS — H1010 Acute atopic conjunctivitis, unspecified eye: Secondary | ICD-10-CM

## 2017-08-07 DIAGNOSIS — J3089 Other allergic rhinitis: Secondary | ICD-10-CM | POA: Diagnosis not present

## 2017-08-07 DIAGNOSIS — K219 Gastro-esophageal reflux disease without esophagitis: Secondary | ICD-10-CM | POA: Diagnosis not present

## 2017-08-07 DIAGNOSIS — B370 Candidal stomatitis: Secondary | ICD-10-CM

## 2017-08-07 DIAGNOSIS — J4540 Moderate persistent asthma, uncomplicated: Secondary | ICD-10-CM

## 2017-08-07 NOTE — Progress Notes (Signed)
Follow-up Note  Referring Provider: Merlene LaughterStoneking, Hal, MD Primary Provider: Merlene LaughterStoneking, Hal, MD Date of Office Visit: 08/07/2017  Subjective:   Victoria Trevino (DOB: 11/01/1940) is a 76 y.o. female who [facility] in re-evaluation of the following:  HPI: [name] presents to this clinic in evaluation of asthma and allergic rhinitis and LPR and history of recurrent thrush.  Her last visit to this clinic was 12 February 2017.  Overall she is doing very well with her respiratory tract.  She has not required a systemic steroid or an antibiotic to treat any type of respiratory tract problem.  She occasionally does get short of breath when working in the garden but her recovery time is less than a minute.  She does exercise with a trainer at a gym 2-4 times per week and does relatively well with this activity.  Rarely does she use a short acting bronchodilator. She has not had to activate her action plan for an asthma flare.  She has not required a systemic steroid to treat an exacerbation of asthma.  Her nose has been doing very well.  She has not required an antibiotic to treat sinusitis.  Her throat hass been doing quite well as has her reflux.  She does continue to use a Diflucan every 2 weeks as well as daily nystatin to prevent thrush.  This has been a very successful plan and she has not had thrush over the course of the past 6 months although she does notice that she gets irritation of her mouth a day or 2 prior to her Diflucan administration.  She did obtain the flu vaccine this year.  Allergies as of 08/07/2017   No Known Allergies     Medication List      albuterol 108 (90 Base) MCG/ACT inhaler Commonly known as:  PROAIR HFA Inhale two puffs every four to six hours as needed for cough or wheeze.   antiseptic oral rinse Liqd 15 mLs by Mouth Rinse route 2 (two) times daily.   budesonide-formoterol 160-4.5 MCG/ACT inhaler Commonly known as:   SYMBICORT INHALE TWO PUFFS TWICE DAILY TO PREVENT COUGH OR WHEEZE. RINSE MOUTH AFTER USE.   CALCIUM + D [phone] MG-UNT-MCG Chew Generic drug:  Calcium-Vitamin D-Vitamin K Chew 1 tablet by mouth 2 (two) times daily. With lunch and dinner   cetirizine 10 MG tablet Commonly known as:  ZYRTEC Take 1 tablet (10 mg total) by mouth daily.   ciclesonide 160 MCG/ACT inhaler Commonly known as:  ALVESCO Inhale two puffs twice daily during flare-up.  Rinse, gargle, and spit after use.   fluconazole 100 MG tablet Commonly known as:  DIFLUCAN Take one tablet every 14 days   fluticasone 50 MCG/ACT nasal spray Commonly known as:  FLONASE Use one spray in each nostril once daily.   LUMIGAN 0.01 % Soln Generic drug:  bimatoprost Place 1 drop into both eyes daily.   multivitamin with minerals Tabs tablet Take 1 tablet by mouth daily.   nystatin 100000 UNIT/ML suspension Commonly known as:  MYCOSTATIN Use one teaspoon swish and swallow every morning.   omeprazole 20 MG capsule Commonly known as:  PRILOSEC TAKE ONE CAPSULE TWICE DAILY   ranitidine 300 MG tablet Commonly known as:  ZANTAC TAKE ONE TABLET EACH EVENING   RECLAST 5 MG/100ML Soln injection Generic drug:  zoledronic acid Inject 5 mg into the vein once. Once a year.   SIMPLY SALINE 0.9 % Aers Generic  drug:  Saline Place 2 sprays into the nose daily.   TYLENOL PO Take by mouth as needed.   Vitamin D-3 1000 units Caps Take 1,000 Units by mouth daily.       Past Medical History:  Diagnosis Date  . Asthma   . GERD (gastroesophageal reflux disease)     Past Surgical History:  Procedure Laterality Date  . Cervical decompression and fusion  1997  . ECTOPIC PREGNANCY SURGERY    . KNEE SURGERY    . nissan infundibulm    . THROAT SURGERY      Review of systems negative except as noted in HPI / PMHx or noted below:  Review of Systems  Constitutional: Negative.   HENT: Negative.   Eyes: Negative.     Respiratory: Negative.   Cardiovascular: Negative.   Gastrointestinal: Negative.   Genitourinary: Negative.   Musculoskeletal: Negative.   Skin: Negative.   Neurological: Negative.   Endo/Heme/Allergies: Negative.   Psychiatric/Behavioral: Negative.      Objective:   Vitals:   08/07/17 1050  BP: 112/68  Pulse: 67  Resp: 17  SpO2: 98%          Physical Exam  Constitutional: She is well-developed, well-nourished, and in no distress.  HENT:  Head: Normocephalic.  Right Ear: Tympanic membrane, external ear and ear canal normal.  Left Ear: Tympanic membrane, external ear and ear canal normal.  Nose: Nose normal. No mucosal edema or rhinorrhea.  Mouth/Throat: Uvula is midline, oropharynx is clear and moist and mucous membranes are normal. No oropharyngeal exudate.  Eyes: Conjunctivae are normal.  Neck: Trachea normal. No tracheal tenderness present. No tracheal deviation present. No thyromegaly present.  Cardiovascular: Normal rate, regular rhythm, S1 normal, S2 normal and normal heart sounds.  No murmur heard. Pulmonary/Chest: Breath sounds normal. No stridor. No respiratory distress. She has no wheezes. She has no rales.  Musculoskeletal: She exhibits no edema.  Lymphadenopathy:       Head (right side): No tonsillar adenopathy present.       Head (left side): No tonsillar adenopathy present.    She has no cervical adenopathy.  Neurological: She is alert. Gait normal.  Skin: No rash noted. She is not diaphoretic. No erythema. Nails show no clubbing.  Psychiatric: Mood and affect normal.    Diagnostics:    Spirometry was performed and demonstrated an FEV1 of 1.81 at 79 % of predicted.  The patient had an Asthma Control Test with the following results: ACT Total Score: 23.    Assessment and Plan:   1. Asthma, moderate persistent, well-controlled   2. Other allergic rhinitis   3. Seasonal allergic conjunctivitis   4. LPRD (laryngopharyngeal reflux disease)   5.  Thrush, oral     1. Continue Symbicort 160 2 inhalations twice a day and add Alvesco 160 2 inhalations twice a day during 'flare up'  2. Continue omeprazole 20 mg twice a day and ranitidine 300 mg in the evening  3. Continue nasal fluticasone one spray each nostril one time per day  4. Continue a combination of the following to address fungal disease:    A. Diflucan 100 one tablet every 2 weeks.  B. nystatin oral solution 1 teaspoon swish and swallow every morning  5. May use OTC Mucinex DM, Zyrtec, and ProAir HFA as needed  6. Return to clinic in 6 months or earlier if problem  Victoria Trevino appears to be doing very well on her current medical therapy and she has a very  good understanding of these medications and how they work and the appropriate dosing of these medications.  I am not going to change any of her therapy at this point in time and she will continue on anti-inflammatory agents for her respiratory tract and therapy directed against reflux as noted above.  I will see her back in this clinic in 6 months or earlier if there is a problem.  Victoria SchimkeEric Kozlow, MD Allergy / Immunology [facility]

## 2017-08-07 NOTE — Patient Instructions (Signed)
   1. Continue Symbicort 160 2 inhalations twice a day and add Alvesco 160 2 inhalations twice a day during 'flare up'  2. Continue omeprazole 20 mg twice a day and ranitidine 300 mg in the evening  3. Continue nasal fluticasone one spray each nostril one time per day  4. Continue a combination of the following to address fungal disease:    A. Diflucan 100 one tablet every 2 weeks.  B. nystatin oral solution 1 teaspoon swish and swallow every morning  5. May use OTC Mucinex DM, Zyrtec, and ProAir HFA as needed  6. Return to clinic in 6 months or earlier if problem

## 2017-08-08 ENCOUNTER — Encounter: Payer: Self-pay | Admitting: Allergy and Immunology

## 2017-08-08 MED ORDER — OMEPRAZOLE 20 MG PO CPDR: DELAYED_RELEASE_CAPSULE | ORAL | 1 refills | Status: DC

## 2017-08-15 DIAGNOSIS — H401213 Low-tension glaucoma, right eye, severe stage: Secondary | ICD-10-CM | POA: Diagnosis not present

## 2017-08-15 DIAGNOSIS — H25011 Cortical age-related cataract, right eye: Secondary | ICD-10-CM | POA: Diagnosis not present

## 2017-08-15 DIAGNOSIS — H401490 Capsular glaucoma with pseudoexfoliation of lens, unspecified eye, stage unspecified: Secondary | ICD-10-CM | POA: Diagnosis not present

## 2017-08-15 DIAGNOSIS — H2511 Age-related nuclear cataract, right eye: Secondary | ICD-10-CM | POA: Diagnosis not present

## 2017-08-15 DIAGNOSIS — H25811 Combined forms of age-related cataract, right eye: Secondary | ICD-10-CM | POA: Diagnosis not present

## 2017-08-29 DIAGNOSIS — H25012 Cortical age-related cataract, left eye: Secondary | ICD-10-CM | POA: Diagnosis not present

## 2017-08-29 DIAGNOSIS — H25812 Combined forms of age-related cataract, left eye: Secondary | ICD-10-CM | POA: Diagnosis not present

## 2017-08-29 DIAGNOSIS — H2512 Age-related nuclear cataract, left eye: Secondary | ICD-10-CM | POA: Diagnosis not present

## 2017-09-05 ENCOUNTER — Other Ambulatory Visit: Payer: Self-pay

## 2017-09-05 MED ORDER — NYSTATIN 100000 UNIT/ML MT SUSP: OROMUCOSAL | 3 refills | Status: DC

## 2017-09-26 ENCOUNTER — Other Ambulatory Visit: Payer: Self-pay | Admitting: Allergy and Immunology

## 2017-09-27 DIAGNOSIS — Z961 Presence of intraocular lens: Secondary | ICD-10-CM | POA: Diagnosis not present

## 2017-10-18 DIAGNOSIS — R600 Localized edema: Secondary | ICD-10-CM | POA: Diagnosis not present

## 2017-10-18 DIAGNOSIS — Z79899 Other long term (current) drug therapy: Secondary | ICD-10-CM | POA: Diagnosis not present

## 2017-10-29 ENCOUNTER — Ambulatory Visit: Payer: PPO | Admitting: Allergy and Immunology

## 2017-10-30 ENCOUNTER — Telehealth: Payer: Self-pay | Admitting: Allergy and Immunology

## 2017-10-30 NOTE — Telephone Encounter (Signed)
Pt had appointment on Tuesday 02.26.2019 and did not come in and came in on Wednesday 02.27.2019 thinking that her was today and was upset that she miss her appointment and I made it for tomorrow with dr gallagher and can we write her no show off, because she never miss appoitment. Thank you very much Victoria Trevino

## 2017-10-31 ENCOUNTER — Encounter: Payer: Self-pay | Admitting: Family Medicine

## 2017-10-31 ENCOUNTER — Ambulatory Visit (INDEPENDENT_AMBULATORY_CARE_PROVIDER_SITE_OTHER): Payer: PPO | Admitting: Family Medicine

## 2017-10-31 VITALS — BP 114/68 | HR 74 | Temp 97.5°F | Resp 17

## 2017-10-31 DIAGNOSIS — H1010 Acute atopic conjunctivitis, unspecified eye: Secondary | ICD-10-CM

## 2017-10-31 DIAGNOSIS — J45909 Unspecified asthma, uncomplicated: Secondary | ICD-10-CM | POA: Insufficient documentation

## 2017-10-31 DIAGNOSIS — K219 Gastro-esophageal reflux disease without esophagitis: Secondary | ICD-10-CM

## 2017-10-31 DIAGNOSIS — J309 Allergic rhinitis, unspecified: Secondary | ICD-10-CM | POA: Diagnosis not present

## 2017-10-31 DIAGNOSIS — J4540 Moderate persistent asthma, uncomplicated: Secondary | ICD-10-CM | POA: Diagnosis not present

## 2017-10-31 DIAGNOSIS — B370 Candidal stomatitis: Secondary | ICD-10-CM

## 2017-10-31 HISTORY — DX: Unspecified asthma, uncomplicated: J45.909

## 2017-10-31 HISTORY — DX: Candidal stomatitis: B37.0

## 2017-10-31 MED ORDER — AZELASTINE HCL 0.1 % NA SOLN: 2.0000 | Freq: Two times a day (BID) | NASAL | 5 refills | Status: DC

## 2017-10-31 NOTE — Progress Notes (Signed)
[address] [place] [address] Dept: [phone]  FOLLOW UP NOTE  Patient ID: Victoria Trevino, female    DOB: Mar 19, 1941  Age: 77 y.o. MRN: [id number] Date of Office Visit: 10/31/2017  Assessment  Chief Complaint: Asthma; URI; and Cough  HPI Victoria Trevino is a 77 year old female who presents to the clinic for a sick visit. She was last seen in the clinic by Dr. Deeann Dowse on 08/07/2017 for evaluation of asthma, allergic rhinitis, LPR, and recurrent thrush.  At that visit, her asthma was reported as well controlled with Symbicort 160-2 puffs twice a day.  Allergic rhinitis and reflux were reported as well controlled.  She continued to use Diflucan every 2 weeks as well as daily nystatin to prevent thrush with success.  At today's visit, Victoria Trevino reports that she began to have chest tightness, cough, and sneeze that began on February 19, 9 days ago.  At that time, she implemented her asthma action plan per Dr. Lucie Leather and began to use her Symbicort with the inhaled corticosteroid.  She reports today that her main symptom is cough with postnasal discharge.  She is reporting shortness of breath after a coughing spell, no wheezing, no shortness of breath with activity, and no nighttime awakenings.  The cough is reported as worse in the daytime with a green/brown color with 1 or 2 times streaks of dried blood.  She currently denies fever, sweats, chills, and a sick contacts.  She reports her nose as having some yellow/green drainage that she can blow out.  She is currently using Flonase nasal spray as well as saline nasal spray, Mucinex, and a moderate fluid intake.   She reports an increase in heartburn in the past 2 weeks which does not seem to be related to food intake as there has been no change in her eating pattern.  About three weeks ago she increased her omeprazole 20 mg to 3 times a day and remained on ranitidine 300 mg 1 time a day with relief.  Victoria Trevino goes to a gym where she has a Psychologist, educational who  is specialized in balance and neuropathy.  She has been unable to attend for the last week due to her respiratory flare and nasal symptoms.  I have suggested that she call me next week to report her symptoms and we will decide in 1 week if she is ready to return to the gym.   Drug Allergies:  No Known Allergies  Physical Exam: BP 114/68 (BP Location: Left Arm, Patient Position: Sitting)   Pulse 74   Temp (!) 97.5 F (36.4 C)   Resp 17   SpO2 98%    Physical Exam  Constitutional: She is oriented to person, place, and time. She appears well-developed and well-nourished.  HENT:  Right Ear: External ear normal.  Left Ear: External ear normal.  Bilateral nares erythematous and edematous with clear nasal drainage noted.  Bilateral ears with TMs normal.  Pharynx slightly erythematous and edematous with no exudate noted.  Eyes normal.  Eyes: Conjunctivae are normal.  Neck: Normal range of motion. Neck supple.  Cardiovascular: Normal rate, regular rhythm and normal heart sounds.  S1-S2 normal.  Regular heart rate and rhythm.  No murmur noted.  Pulmonary/Chest: Effort normal and breath sounds normal.  Lungs clear to auscultation  Musculoskeletal: Normal range of motion.  Neurological: She is alert and oriented to person, place, and time.  Skin: Skin is warm and dry.  Psychiatric: She has a normal  mood and affect. Her behavior is normal.    Diagnostics: FVC 2.38, FEV1 1.77.  Predicted FVC 3.29, predicted FEV1 2.48.  Spirometry indicates mild restriction.  Assessment and Plan: 1. Asthma, moderate persistent, well-controlled   2. Thrush, oral   3. Allergic rhinoconjunctivitis   4. LPRD (laryngopharyngeal reflux disease)     Meds ordered this encounter  Medications  . azelastine (ASTELIN) 0.1 % nasal spray    Sig: Place 2 sprays into both nostrils 2 (two) times daily.    Dispense:  30 mL    Refill:  5    Patient Instructions    1. Continue Symbicort 160 2 inhalations twice a  day and add Alvesco 160 2 inhalations twice a day during 'flare up'  2. Continue omeprazole 20 mg twice a day and ranitidine 300 mg in the evening  3. Continue nasal fluticasone one spray each nostril one time per day  4. Continue a combination of the following to address fungal disease:    A. Diflucan 100 one tablet every 2 weeks.  B. nystatin oral solution 1 teaspoon swish and swallow every morning  5. May use OTC Mucinex DM, Zyrtec, and ProAir HFA as needed  6. Add azelastine nasal spray 2 sprays in each nostril twice a day as needed for sneezing or nasal itching  7. Call me on Thursday, March 7th to evaluate if you are ready to return to the gym. Return to clinic in 2 months or earlier if problem   Return in about 2 months (around 12/29/2017), or if symptoms worsen or fail to improve.    Thank you for the opportunity to care for this patient.  Please do not hesitate to contact me with questions.  Thermon LeylandAnne Jasime Westergren, FNP [facility]

## 2017-10-31 NOTE — Telephone Encounter (Signed)
We will adjust no show - kt

## 2017-10-31 NOTE — Patient Instructions (Signed)
   1. Continue Symbicort 160 2 inhalations twice a day and add Alvesco 160 2 inhalations twice a day during 'flare up'  2. Continue omeprazole 20 mg twice a day and ranitidine 300 mg in the evening  3. Continue nasal fluticasone one spray each nostril one time per day  4. Continue a combination of the following to address fungal disease:    A. Diflucan 100 one tablet every 2 weeks.  B. nystatin oral solution 1 teaspoon swish and swallow every morning  5. May use OTC Mucinex DM, Zyrtec, and ProAir HFA as needed  6. Add azelastine nasal spray 2 sprays in each nostril twice a day as needed for sneezing or nasal itching  7. Call me on Thursday, March 7th to evaluate if you are ready to return to the gym. Return to clinic in 2 months or earlier if problem

## 2017-11-07 ENCOUNTER — Telehealth: Payer: Self-pay

## 2017-11-07 NOTE — Telephone Encounter (Signed)
Letter done per Thermon LeylandAnne Ambs, NP. Patient will pick up today

## 2017-12-03 ENCOUNTER — Telehealth: Payer: Self-pay | Admitting: Allergy and Immunology

## 2017-12-03 MED ORDER — FLUTICASONE PROPIONATE 50 MCG/ACT NA SUSP: NASAL | 1 refills | Status: DC

## 2017-12-03 NOTE — Telephone Encounter (Signed)
Patient needs a refill on fluticasone sent to her mail order pharmacy [facility] for a 90 day supply

## 2017-12-03 NOTE — Telephone Encounter (Signed)
Refills sent in left message advising patient this was done.

## 2017-12-31 ENCOUNTER — Encounter: Payer: Self-pay | Admitting: Allergy and Immunology

## 2017-12-31 ENCOUNTER — Ambulatory Visit (INDEPENDENT_AMBULATORY_CARE_PROVIDER_SITE_OTHER): Payer: PPO | Admitting: Allergy and Immunology

## 2017-12-31 VITALS — BP 84/58 | HR 80 | Temp 98.1°F | Resp 16

## 2017-12-31 DIAGNOSIS — B370 Candidal stomatitis: Secondary | ICD-10-CM

## 2017-12-31 DIAGNOSIS — K219 Gastro-esophageal reflux disease without esophagitis: Secondary | ICD-10-CM | POA: Diagnosis not present

## 2017-12-31 DIAGNOSIS — J4541 Moderate persistent asthma with (acute) exacerbation: Secondary | ICD-10-CM | POA: Diagnosis not present

## 2017-12-31 DIAGNOSIS — J3089 Other allergic rhinitis: Secondary | ICD-10-CM

## 2017-12-31 MED ORDER — FLUCONAZOLE 150 MG PO TABS: 150.0000 mg | ORAL_TABLET | ORAL | 3 refills | Status: DC

## 2017-12-31 NOTE — Progress Notes (Signed)
Follow-up Note  Referring Provider: Merlene Laughter, MD Primary Provider: Merlene Laughter, MD Date of Office Visit: 12/31/2017  Subjective:   Victoria Trevino (DOB: 17-Jan-1941) is a 77 y.o. female who [facility] 12[facility]31/2017 in re-evaluation of the following:  HPI: Zeola returns to this clinic in reevaluation of her asthma and allergic rhinitis and reflux.  I last saw her in this clinic 07 August 2017 and she visited with our nurse practitioner on 31 October 2017 for a apparent viral induced flare of her respiratory tract disease.  Since that visit she has really done very well and has had very little problems with asthma and rarely uses a short acting bronchodilator and has not required a systemic steroid or an antibiotic for respiratory tract issue since being seen in this clinic.  She believes that her throat was doing very well.  Her chronic thrush was not as particularly big issue.  She was consistently using Symbicort and nasal fluticasone and omeprazole and ranitidine.  However, this Sunday she developed hoarseness which she thought was secondary to her reflux and then she developed a cough yesterday and then she developed a fever today of 100.4.  She is coughing and she is making some sputum production that oscillates from clear to green.  She has not been having a tremendous amount of issues with her nose.  She still continues to be somewhat hoarse.  Allergies as of 12/31/2017   No Known Allergies     Medication List      albuterol 108 (90 Base) MCG/ACT inhaler Commonly known as:  PROAIR HFA Inhale two puffs every four to six hours as needed for cough or wheeze.   antiseptic oral rinse Liqd 15 mLs by Mouth Rinse route 2 (two) times daily.   azelastine 0.1 % nasal spray Commonly known as:  ASTELIN Place 2 sprays into both nostrils 2 (two) times daily.   budesonide-formoterol 160-4.5 MCG/ACT inhaler Commonly known as:  SYMBICORT Inhale 2  puffs by mouth twice a day to prevent cough or wheezing(rinse mouth after use); Must be seen in office for further refills.   CALCIUM + D [phone] MG-UNT-MCG Chew Generic drug:  Calcium-Vitamin D-Vitamin K Chew 1 tablet by mouth 2 (two) times daily. With lunch and dinner   cetirizine 10 MG tablet Commonly known as:  ZYRTEC Take 1 tablet (10 mg total) by mouth daily.   ciclesonide 160 MCG/ACT inhaler Commonly known as:  ALVESCO Inhale two puffs twice daily during flare-up.  Rinse, gargle, and spit after use.   fluconazole 150 MG tablet Commonly known as:  DIFLUCAN Take 1 tablet (150 mg total) by mouth every 14 (fourteen) days.   fluticasone 50 MCG/ACT nasal spray Commonly known as:  FLONASE Use one spray in each nostril once daily.   LUMIGAN 0.01 % Soln Generic drug:  bimatoprost Place 1 drop into both eyes daily.   multivitamin with minerals Tabs tablet Take 1 tablet by mouth daily.   nystatin 100000 UNIT/ML suspension Commonly known as:  MYCOSTATIN Use one teaspoon swish and swallow every morning.   omeprazole 20 MG capsule Commonly known as:  PRILOSEC TAKE ONE CAPSULE TWICE DAILY   ranitidine 300 MG tablet Commonly known as:  ZANTAC TAKE ONE TABLET EACH EVENING   RECLAST 5 MG/100ML Soln injection Generic drug:  zoledronic acid Inject 5 mg into the vein once. Once a year.   SIMPLY SALINE 0.9 % Aers Generic drug:  Saline Place 2  sprays into the nose daily.   TYLENOL PO Take by mouth as needed.   Vitamin D-3 1000 units Caps Take 1,000 Units by mouth daily.       Past Medical History:  Diagnosis Date  . Asthma   . GERD (gastroesophageal reflux disease)     Past Surgical History:  Procedure Laterality Date  . Cervical decompression and fusion  1997  . ECTOPIC PREGNANCY SURGERY    . KNEE SURGERY    . nissan infundibulm    . THROAT SURGERY      Review of systems negative except as noted in HPI / PMHx or noted below:  Review of Systems    Constitutional: Negative.   HENT: Negative.   Eyes: Negative.   Respiratory: Negative.   Cardiovascular: Negative.   Gastrointestinal: Negative.   Genitourinary: Negative.   Musculoskeletal: Negative.   Skin: Negative.   Neurological: Negative.   Endo/Heme/Allergies: Negative.   Psychiatric/Behavioral: Negative.      Objective:   Vitals:   12/31/17 1615  BP: (!) 84/58  Pulse: 80  Resp: 16  Temp: 98.1 F (36.7 C)  SpO2: 96%          Physical Exam  HENT:  Head: Normocephalic.  Right Ear: Tympanic membrane, external ear and ear canal normal.  Left Ear: Tympanic membrane, external ear and ear canal normal.  Nose: Mucosal edema (Erythematous) present. No rhinorrhea.  Mouth/Throat: Uvula is midline, oropharynx is clear and moist and mucous membranes are normal. No oropharyngeal exudate.  Eyes: Conjunctivae are normal.  Neck: Trachea normal. No tracheal tenderness present. No tracheal deviation present. No thyromegaly present.  Cardiovascular: Normal rate, regular rhythm, S1 normal, S2 normal and normal heart sounds.  No murmur heard. Pulmonary/Chest: No stridor. No respiratory distress. She has wheezes (Scattered inspiratory and expiratory wheezes). She has no rales.  Musculoskeletal: She exhibits no edema.  Lymphadenopathy:       Head (right side): No tonsillar adenopathy present.       Head (left side): No tonsillar adenopathy present.    She has no cervical adenopathy.  Neurological: She is alert.  Skin: No rash noted. She is not diaphoretic. No erythema. Nails show no clubbing.    Diagnostics:    Spirometry was performed and demonstrated an FEV1 of 1.74 at 76 % of predicted.  The patient had an Asthma Control Test with the following results: ACT Total Score: 22.    Assessment and Plan:   1. Asthma, not well controlled, moderate persistent, with acute exacerbation   2. Other allergic rhinitis   3. LPRD (laryngopharyngeal reflux disease)   4. Thrush, oral      1. Continue Symbicort 160 2 inhalations twice a day and add Alvesco 160 2 inhalations twice a day during 'flare up'  2. Continue omeprazole 20 mg twice a day and ranitidine 300 mg in the evening  3. Continue nasal fluticasone one spray each nostril one time per day  4. Continue a combination of the following to address fungal disease:    A. Diflucan 100 one tablet every 2 weeks.  B. nystatin oral solution 1 teaspoon swish and swallow every morning  5. May use OTC Mucinex DM, Zyrtec, azelastine, and ProAir HFA as needed  6. For this most recent event:   A. Continue Alvesco "Action Plan"  B. Tylenol if needed  C. Prednisone  - one tablet one time per day for 5 days only  D. Nasal saline  7. Antibiotics?  8. Return to clinic  in late Summer or earlier if problem   I will assume that Aubriauna has a viral respiratory tract infection that is giving rise to significant inflammation of her airway and she will utilize the plan noted above and I will see her back in this clinic pending her response.  We will hold off on any antibiotics at this point in time but she will contact me should she develop significant symptoms in the face of this plan.  Laurette Schimke, MD Allergy / Immunology [facility]

## 2017-12-31 NOTE — Patient Instructions (Addendum)
   1. Continue Symbicort 160 2 inhalations twice a day and add Alvesco 160 2 inhalations twice a day during 'flare up'  2. Continue omeprazole 20 mg twice a day and ranitidine 300 mg in the evening  3. Continue nasal fluticasone one spray each nostril one time per day  4. Continue a combination of the following to address fungal disease:    A. Diflucan 100 one tablet every 2 weeks.  B. nystatin oral solution 1 teaspoon swish and swallow every morning  5. May use OTC Mucinex DM, Zyrtec, azelastine, and ProAir HFA as needed  6. For this most recent event:   A. Continue Alvesco "Action Plan"  B. Tylenol if needed  C. Prednisone  - one tablet one time per day for 5 days only  D. Nasal saline  7. Antibiotics?  8. Return to clinic in late Summer or earlier if problem

## 2018-01-01 ENCOUNTER — Telehealth: Payer: Self-pay | Admitting: Allergy and Immunology

## 2018-01-01 ENCOUNTER — Encounter: Payer: Self-pay | Admitting: Allergy and Immunology

## 2018-01-01 MED ORDER — FLUCONAZOLE 100 MG PO TABS: ORAL_TABLET | ORAL | 5 refills | Status: DC

## 2018-01-01 NOTE — Telephone Encounter (Addendum)
Pt went to pharmacy and put up Fluconazole 100 mg but when she got home it was . Can you fix it? [facility]. [phone].

## 2018-01-01 NOTE — Telephone Encounter (Signed)
Prescription changed to reflect what was instructed in discharge instructions. I have advised patient as well.

## 2018-01-09 ENCOUNTER — Other Ambulatory Visit: Payer: Self-pay | Admitting: *Deleted

## 2018-01-09 ENCOUNTER — Telehealth: Payer: Self-pay | Admitting: Allergy and Immunology

## 2018-01-09 MED ORDER — OMEPRAZOLE 20 MG PO CPDR: DELAYED_RELEASE_CAPSULE | ORAL | 1 refills | Status: DC

## 2018-01-09 MED ORDER — CICLESONIDE 160 MCG/ACT IN AERS: INHALATION_SPRAY | RESPIRATORY_TRACT | 1 refills | Status: DC

## 2018-01-09 MED ORDER — AZELASTINE HCL 0.1 % NA SOLN: 2.0000 | Freq: Two times a day (BID) | NASAL | 5 refills | Status: DC

## 2018-01-09 NOTE — Telephone Encounter (Signed)
Pt called and needs to have Azelastine, Alvesco to [facility]. [phone].

## 2018-01-09 NOTE — Telephone Encounter (Signed)
Prescription(s) have been sent

## 2018-01-14 ENCOUNTER — Ambulatory Visit (INDEPENDENT_AMBULATORY_CARE_PROVIDER_SITE_OTHER): Payer: PPO | Admitting: Allergy and Immunology

## 2018-01-14 ENCOUNTER — Encounter: Payer: Self-pay | Admitting: Allergy and Immunology

## 2018-01-14 VITALS — BP 108/64 | HR 84 | Temp 98.5°F | Resp 20

## 2018-01-14 DIAGNOSIS — B370 Candidal stomatitis: Secondary | ICD-10-CM | POA: Diagnosis not present

## 2018-01-14 DIAGNOSIS — J4541 Moderate persistent asthma with (acute) exacerbation: Secondary | ICD-10-CM | POA: Diagnosis not present

## 2018-01-14 DIAGNOSIS — J3089 Other allergic rhinitis: Secondary | ICD-10-CM | POA: Diagnosis not present

## 2018-01-14 DIAGNOSIS — K219 Gastro-esophageal reflux disease without esophagitis: Secondary | ICD-10-CM | POA: Diagnosis not present

## 2018-01-14 MED ORDER — MOMETASONE FUROATE 200 MCG/ACT IN AERO: 2.0000 | INHALATION_SPRAY | Freq: Two times a day (BID) | RESPIRATORY_TRACT | 3 refills | Status: DC

## 2018-01-14 NOTE — Progress Notes (Signed)
Follow-up Note  Referring Provider: Merlene Laughter, MD Primary Provider: Merlene Laughter, MD Date of Office Visit: 01/14/2018  Subjective:   Victoria Trevino (DOB: 27-Feb-1941) is a 77 y.o. female [facility] in re-evaluation of the following:  HPI: Victoria Trevino returns to this clinic in reevaluation of her asthma and allergic rhinitis, reflux, and recent exacerbation triggered off by a viral respiratory tract infection on 02 May 2018.  She is much better at this point in time.  Her coughing has decreased dramatically.  She ended up developing a fever of 102 in association with a headache as well as her coughing.  Her nose is doing well.  Her reflux has not been active.  Her insurance company will no longer pay for [employer] and she has been out of this agent for several days.  Allergies as of 01/14/2018   No Known Allergies     Medication List      albuterol 108 (90 Base) MCG/ACT inhaler Commonly known as:  PROAIR HFA Inhale two puffs every four to six hours as needed for cough or wheeze.   antiseptic oral rinse Liqd 15 mLs by Mouth Rinse route 2 (two) times daily.   azelastine 0.1 % nasal spray Commonly known as:  ASTELIN Place 2 sprays into both nostrils 2 (two) times daily.   budesonide-formoterol 160-4.5 MCG/ACT inhaler Commonly known as:  SYMBICORT Inhale 2 puffs by mouth twice a day to prevent cough or wheezing(rinse mouth after use); Must be seen in office for further refills.   CALCIUM + D [id number] MG-UNT-MCG Chew Generic drug:  Calcium-Vitamin D-Vitamin K Chew 1 tablet by mouth 2 (two) times daily. With lunch and dinner   cetirizine 10 MG tablet Commonly known as:  ZYRTEC Take 1 tablet (10 mg total) by mouth daily.   ciclesonide 160 MCG/ACT inhaler Commonly known as:  ALVESCO Inhale two puffs twice daily during flare-up.  Rinse, gargle, and spit after use.   fluconazole 100 MG tablet Commonly known as:   DIFLUCAN 1 tablet every 2 weeks.   fluticasone 50 MCG/ACT nasal spray Commonly known as:  FLONASE Use one spray in each nostril once daily.   LUMIGAN 0.01 % Soln Generic drug:  bimatoprost Place 1 drop into both eyes daily.   multivitamin with minerals Tabs tablet Take 1 tablet by mouth daily.   nystatin 100000 UNIT/ML suspension Commonly known as:  MYCOSTATIN Use one teaspoon swish and swallow every morning.   omeprazole 20 MG capsule Commonly known as:  PRILOSEC TAKE ONE CAPSULE TWICE DAILY   ranitidine 300 MG tablet Commonly known as:  ZANTAC TAKE ONE TABLET EACH EVENING   RECLAST 5 MG/100ML Soln injection Generic drug:  zoledronic acid Inject 5 mg into the vein once. Once a year.   SIMPLY SALINE 0.9 % Aers Generic drug:  Saline Place 2 sprays into the nose daily.   TYLENOL PO Take by mouth as needed.   Vitamin D-3 1000 units Caps Take 1,000 Units by mouth daily.       Past Medical History:  Diagnosis Date  . Asthma   . GERD (gastroesophageal reflux disease)     Past Surgical History:  Procedure Laterality Date  . Cervical decompression and fusion  1997  . ECTOPIC PREGNANCY SURGERY    . KNEE SURGERY    . nissan infundibulm    . THROAT SURGERY      Review of systems negative except as noted in HPI /  PMHx or noted below:  Review of Systems  Constitutional: Negative.   HENT: Negative.   Eyes: Negative.   Respiratory: Negative.   Cardiovascular: Negative.   Gastrointestinal: Negative.   Genitourinary: Negative.   Musculoskeletal: Negative.   Skin: Negative.   Neurological: Negative.   Endo/Heme/Allergies: Negative.   Psychiatric/Behavioral: Negative.      Objective:   Vitals:   01/14/18 1539  BP: 108/64  Pulse: 84  Resp: 20  Temp: 98.5 F (36.9 C)          Physical Exam  HENT:  Head: Normocephalic.  Right Ear: Tympanic membrane, external ear and ear canal normal.  Left Ear: Tympanic membrane, external ear and ear canal  normal.  Nose: Nose normal. No mucosal edema or rhinorrhea.  Mouth/Throat: Uvula is midline, oropharynx is clear and moist and mucous membranes are normal. No oropharyngeal exudate.  Eyes: Conjunctivae are normal.  Neck: Trachea normal. No tracheal tenderness present. No tracheal deviation present. No thyromegaly present.  Cardiovascular: Normal rate, regular rhythm, S1 normal, S2 normal and normal heart sounds.  No murmur heard. Pulmonary/Chest: No stridor. No respiratory distress. She has wheezes (End expiratory wheezes posterior lung fields). She has no rales.  Musculoskeletal: She exhibits no edema.  Lymphadenopathy:       Head (right side): No tonsillar adenopathy present.       Head (left side): No tonsillar adenopathy present.    She has no cervical adenopathy.  Neurological: She is alert.  Skin: No rash noted. She is not diaphoretic. No erythema. Nails show no clubbing.    Diagnostics:    Spirometry was performed and demonstrated an FEV1 of 1.82 at 74 % of predicted.  The patient had an Asthma Control Test with the following results: ACT Total Score: 19.    Assessment and Plan:   1. Asthma, not well controlled, moderate persistent, with acute exacerbation   2. Other allergic rhinitis   3. LPRD (laryngopharyngeal reflux disease)   4. Thrush, oral     1. Continue Symbicort 160 2 inhalations twice a day and add Asmanex 200 HFA 2 inhalations twice a day during 'flare up'  2. Continue omeprazole 20 mg twice a day and ranitidine 300 mg in the evening  3. Continue nasal fluticasone one spray each nostril one time per day  4. Continue a combination of the following to address fungal disease:    A. Diflucan 100 one tablet every 2 weeks.  B. nystatin oral solution 1 teaspoon swish and swallow every morning  5. May use OTC Mucinex DM, Zyrtec, azelastine, and ProAir HFA as needed  6. Return to clinic in late Summer or earlier if problem   Victoria Trevino appears to be improving now  that she appears to have beaten her viral respiratory tract infection and she is probably just dealing with some of the damage left behind.  We will get her to perform her action plan including the combination of Symbicort and at this point in time Asmanex until she completely clears up and then she can go back to just using Symbicort by itself.  She has a plan to also address her reflux condition and her chronic oral thrush.  I will see her back in this clinic in the summer or earlier if there is a problem.  Laurette Schimke, MD Allergy / Immunology [facility]

## 2018-01-14 NOTE — Patient Instructions (Signed)
   1. Continue Symbicort 160 2 inhalations twice a day and add Asmanex 200 HFA 2 inhalations twice a day during 'flare up'  2. Continue omeprazole 20 mg twice a day and ranitidine 300 mg in the evening  3. Continue nasal fluticasone one spray each nostril one time per day  4. Continue a combination of the following to address fungal disease:    A. Diflucan 100 one tablet every 2 weeks.  B. nystatin oral solution 1 teaspoon swish and swallow every morning  5. May use OTC Mucinex DM, Zyrtec, azelastine, and ProAir HFA as needed  6. Return to clinic in late Summer or earlier if problem

## 2018-01-15 ENCOUNTER — Encounter: Payer: Self-pay | Admitting: Allergy and Immunology

## 2018-01-17 DIAGNOSIS — G629 Polyneuropathy, unspecified: Secondary | ICD-10-CM | POA: Diagnosis not present

## 2018-01-17 DIAGNOSIS — Z1389 Encounter for screening for other disorder: Secondary | ICD-10-CM | POA: Diagnosis not present

## 2018-01-17 DIAGNOSIS — Z79899 Other long term (current) drug therapy: Secondary | ICD-10-CM | POA: Diagnosis not present

## 2018-01-17 DIAGNOSIS — M85852 Other specified disorders of bone density and structure, left thigh: Secondary | ICD-10-CM | POA: Diagnosis not present

## 2018-01-17 DIAGNOSIS — Z0000 Encounter for general adult medical examination without abnormal findings: Secondary | ICD-10-CM | POA: Diagnosis not present

## 2018-01-17 DIAGNOSIS — G250 Essential tremor: Secondary | ICD-10-CM | POA: Diagnosis not present

## 2018-01-17 DIAGNOSIS — E441 Mild protein-calorie malnutrition: Secondary | ICD-10-CM | POA: Diagnosis not present

## 2018-01-17 DIAGNOSIS — J4530 Mild persistent asthma, uncomplicated: Secondary | ICD-10-CM | POA: Diagnosis not present

## 2018-01-28 DIAGNOSIS — H401213 Low-tension glaucoma, right eye, severe stage: Secondary | ICD-10-CM | POA: Diagnosis not present

## 2018-01-29 ENCOUNTER — Ambulatory Visit: Payer: PPO | Admitting: Allergy and Immunology

## 2018-02-04 ENCOUNTER — Ambulatory Visit: Payer: PPO | Admitting: Allergy and Immunology

## 2018-02-17 ENCOUNTER — Ambulatory Visit (INDEPENDENT_AMBULATORY_CARE_PROVIDER_SITE_OTHER): Payer: PPO | Admitting: Neurology

## 2018-02-17 ENCOUNTER — Encounter: Payer: Self-pay | Admitting: Neurology

## 2018-02-17 VITALS — BP 100/64 | HR 75 | Ht 67.75 in | Wt 120.4 lb

## 2018-02-17 DIAGNOSIS — G609 Hereditary and idiopathic neuropathy, unspecified: Secondary | ICD-10-CM

## 2018-02-17 NOTE — Patient Instructions (Signed)
Return to clinic in 1 year.

## 2018-02-17 NOTE — Progress Notes (Signed)
Follow-up Visit   Date: 02/17/18    LAURIANNE Trevino MRN: [id number] DOB: 1941/06/29   Interim History: Victoria Trevino is a 77 y.o. right-handed Caucasian female with asthma, GERD, migraine, s/p cervical decompression and fusion (1997) and glaucoma returning to the clinic for follow-up of neuropathy.  The patient was accompanied to the clinic by self.  History of present illness: Initial visit 02/16/2016:  Starting around early 2000s, she noticed that the soles of her feet felt like cardboard and stiff.  Over the years, she developed similar symptoms over the dorsum of the feet and associated burning pain.  Painful paresthesias is most noticeable at night when she goes to bed, and wears socks at night because it helps.  The numbness is constant and involves the entire foot.  She endorses imbalance and lightheadedness.  She suffered a fall last year and fractured her right patella.  She walks independently. She had NCS/EMG of the lower extremities in May 2017 performed elsewhere which showed moderate to severe polyneuropathy and is referred for further evaluation.  She denies any weakness of the legs.  She has been seeing a Physiological scientist for leg strengthening since her fall.  She has no history of diabetes, alcoholism, or exposure to solvents.  There is no family history of neuropathy.    In 2018, she suffered a fall in the garden and fractured her left wrist.    UPDATE 02/17/2018:  She is here for 1 year follow-up visit.  Overall, her neuropathy continues to involve the feet and lower legs, no significant progression.  She denies any falls or weakness.  She stays active and goes to the gym twice per week.  She has been especially working on balance exercises and feels this has helped a lot.  She continues to walk unassisted.  No paresthesias of the hands.  She stays busy with her volunteering with her church and organizing things at home.  With the loss of her husband in February 2018, organizing  things at home is difficult.  Medications:  Current Outpatient Medications on File Prior to Visit  Medication Sig Dispense Refill  . Acetaminophen (TYLENOL PO) Take by mouth as needed.    Marland Kitchen albuterol (PROAIR HFA) 108 (90 Base) MCG/ACT inhaler Inhale two puffs every four to six hours as needed for cough or wheeze. 1 Inhaler 3  . antiseptic oral rinse (BIOTENE) LIQD 15 mLs by Mouth Rinse route 2 (two) times daily.    Marland Kitchen azelastine (ASTELIN) 0.1 % nasal spray Place 2 sprays into both nostrils 2 (two) times daily. 30 mL 5  . budesonide-formoterol (SYMBICORT) 160-4.5 MCG/ACT inhaler Inhale 2 puffs by mouth twice a day to prevent cough or wheezing(rinse mouth after use); Must be seen in office for further refills. 30.6 g 1  . Calcium-Vitamin D-Vitamin K (CALCIUM + D) [phone] MG-UNT-MCG CHEW Chew 1 tablet by mouth 2 (two) times daily. With lunch and dinner    . cetirizine (ZYRTEC) 10 MG tablet Take 1 tablet (10 mg total) by mouth daily. 90 tablet 1  . Cholecalciferol (VITAMIN D-3) 1000 UNITS CAPS Take 1,000 Units by mouth daily.    . ciclesonide (ALVESCO) 160 MCG/ACT inhaler Inhale two puffs twice daily during flare-up.  Rinse, gargle, and spit after use. 1 Inhaler 1  . fluconazole (DIFLUCAN) 100 MG tablet 1 tablet every 2 weeks. 2 tablet 5  . fluticasone (FLONASE) 50 MCG/ACT nasal spray Use one spray in each nostril once daily. 48 g 1  .  LUMIGAN 0.01 % SOLN Place 1 drop into both eyes daily.    . Mometasone Furoate (ASMANEX HFA) 200 MCG/ACT AERO Inhale 2 puffs into the lungs 2 (two) times daily. 13 g 3  . Multiple Vitamin (MULTIVITAMIN WITH MINERALS) TABS tablet Take 1 tablet by mouth daily.    Marland Kitchen nystatin (MYCOSTATIN) 100000 UNIT/ML suspension Use one teaspoon swish and swallow every morning. 150 mL 3  . omeprazole (PRILOSEC) 20 MG capsule TAKE ONE CAPSULE TWICE DAILY 180 capsule 1  . ranitidine (ZANTAC) 300 MG tablet TAKE ONE TABLET EACH EVENING 30 tablet 5  . Saline (SIMPLY SALINE) 0.9 % AERS  Place 2 sprays into the nose daily.    . zoledronic acid (RECLAST) 5 MG/100ML SOLN injection Inject 5 mg into the vein once. Once a year.     No current facility-administered medications on file prior to visit.     Allergies: No Known Allergies  Review of Systems:  CONSTITUTIONAL: No fevers, chills, night sweats, or weight loss.  EYES: No visual changes or eye pain ENT: No hearing changes.  No history of nose bleeds.   RESPIRATORY: No cough, wheezing and shortness of breath.   CARDIOVASCULAR: Negative for chest pain, and palpitations.   GI: Negative for abdominal discomfort, blood in stools or black stools.  No recent change in bowel habits.   GU:  No history of incontinence.   MUSCLOSKELETAL: No history of joint pain or swelling.  No myalgias.   SKIN: Negative for lesions, rash, and itching.   ENDOCRINE: Negative for cold or heat intolerance, polydipsia or goiter.   PSYCH:  No depression or anxiety symptoms.   NEURO: As Above.   Vital Signs:  BP 100/64   Pulse 75   Ht 5' 7.75" (1.721 m)   Wt 120 lb 6 oz (54.6 kg)   SpO2 97%   BMI 18.44 kg/m   General Medical Exam:   General:  Well appearing, comfortable  Neck:   No carotid bruits. Respiratory:  Clear to auscultation, good air entry bilaterally.   Cardiac:  Regular rate and rhythm, no murmur.   Ext:  No edema  Neurological Exam: MENTAL STATUS including orientation to time, place, person, recent and remote memory, attention span and concentration, language, and fund of knowledge is normal.  Speech is not dysarthric.  CRANIAL NERVES:   Pupils equal round and reactive to light.  Normal conjugate, extra-ocular eye movements in all directions of gaze.  No ptosis.  Face is symmetric.   MOTOR:  Motor strength is 5/5 in all extremities.   No pronator drift.  Tone is normal.    MSRs:  Reflexes are 2+/4 throughout, except trace at the Achilles.  SENSORY:  Reduced vibration at the ankles and absent at the great toe bilaterally.     COORDINATION/GAIT:  Gait narrow based and stable. She is able to perform tandem and stressed gait.  Data: Labs 02/16/2016:  ESR 11, vitamin B12 673, TSH 1.65, copper 108, SPEP with IFE no M protein  IMPRESSION/PLAN: Idiopathic peripheral neuropathy affecting the feet and lower legs.  She has some burning paresthesias and pain, and does not wish to be on any medication for the pain.  She remains very active and engages regularly in balance exercises.  She has been working with a Physiological scientist for 2+ years, which has helped strength her balance.   Fall precautions were stressed. Recommend daily foot inspection.   Return to clinic in 1 year  Greater than 50% of this 17  minute visit was spent in counseling, explanation of diagnosis, planning of further management, and coordination of care.   Thank you for allowing me to participate in patient's care.  If I can answer any additional questions, I would be pleased to do so.    Sincerely,    Keagan Anthis K. Posey Pronto, DO

## 2018-02-26 ENCOUNTER — Other Ambulatory Visit: Payer: Self-pay | Admitting: Geriatric Medicine

## 2018-02-26 DIAGNOSIS — Z1231 Encounter for screening mammogram for malignant neoplasm of breast: Secondary | ICD-10-CM

## 2018-03-11 ENCOUNTER — Ambulatory Visit
Admission: RE | Admit: 2018-03-11 | Discharge: 2018-03-11 | Disposition: A | Discharge status: Home / Self Care | Payer: PPO | Source: Ambulatory Visit | Attending: Geriatric Medicine | Admitting: Geriatric Medicine

## 2018-03-11 DIAGNOSIS — Z1231 Encounter for screening mammogram for malignant neoplasm of breast: Secondary | ICD-10-CM

## 2018-03-18 ENCOUNTER — Other Ambulatory Visit: Payer: Self-pay | Admitting: Allergy and Immunology

## 2018-03-25 ENCOUNTER — Ambulatory Visit (INDEPENDENT_AMBULATORY_CARE_PROVIDER_SITE_OTHER): Payer: PPO | Admitting: Allergy and Immunology

## 2018-03-25 ENCOUNTER — Encounter: Payer: Self-pay | Admitting: Allergy and Immunology

## 2018-03-25 ENCOUNTER — Telehealth: Payer: Self-pay | Admitting: *Deleted

## 2018-03-25 VITALS — BP 116/66 | HR 68 | Resp 16

## 2018-03-25 DIAGNOSIS — R490 Dysphonia: Secondary | ICD-10-CM | POA: Diagnosis not present

## 2018-03-25 DIAGNOSIS — K219 Gastro-esophageal reflux disease without esophagitis: Secondary | ICD-10-CM

## 2018-03-25 DIAGNOSIS — J4540 Moderate persistent asthma, uncomplicated: Secondary | ICD-10-CM | POA: Diagnosis not present

## 2018-03-25 DIAGNOSIS — B370 Candidal stomatitis: Secondary | ICD-10-CM

## 2018-03-25 DIAGNOSIS — J3089 Other allergic rhinitis: Secondary | ICD-10-CM

## 2018-03-25 NOTE — Progress Notes (Signed)
Follow-up Note  Referring Provider: Merlene Laughter, MD Primary Provider: Merlene Laughter, MD Date of Office Visit: 03/25/2018  Subjective:   Victoria Trevino (DOB: 1941/04/27) is a 77 y.o. female who [facility] in re-evaluation of the following:  HPI: Victoria Trevino returns to this clinic in reevaluation of asthma and allergic rhinitis and LPR.  Her last visit to this clinic was 14 Jan 2018.  Overall her asthma has been under excellent control and she rarely uses a short acting bronchodilator and she has not had to add her Asmanex to consistent use of Symbicort 320 mcg twice a day.  She has not required a systemic steroid or antibiotic for any type of respiratory tract issue since being seen in this clinic last.  Her nose is really doing quite well on nasal fluticasone.  She believes her reflux is under excellent control with omeprazole and ranitidine.  However, she has been getting progressive hoarseness for the past month or so.  This occurs even though she consistently uses Diflucan every 2 weeks and daily nystatin to deal with her oral thrush issue that appears to occur with Symbicort use.  She has had an ENT evaluation many years ago with Dr. Christella Hartigan.  Allergies as of 03/25/2018   No Known Allergies     Medication List      albuterol 108 (90 Base) MCG/ACT inhaler Commonly known as:  PROAIR HFA Inhale two puffs every four to six hours as needed for cough or wheeze.   antiseptic oral rinse Liqd 15 mLs by Mouth Rinse route 2 (two) times daily.   azelastine 0.1 % nasal spray Commonly known as:  ASTELIN Place 2 sprays into both nostrils 2 (two) times daily.   budesonide-formoterol 160-4.5 MCG/ACT inhaler Commonly known as:  SYMBICORT Inhale 2 puffs by mouth twice a day to prevent cough or wheezing(rinse mouth after use); Must be seen in office for further refills.   CALCIUM + D [phone] MG-UNT-MCG Chew Generic drug:  Calcium-Vitamin  D-Vitamin K Chew 1 tablet by mouth 2 (two) times daily. With lunch and dinner   cetirizine 10 MG tablet Commonly known as:  ZYRTEC Take 1 tablet (10 mg total) by mouth daily.   fluticasone 50 MCG/ACT nasal spray Commonly known as:  FLONASE Use one spray in each nostril once daily.   LUMIGAN 0.01 % Soln Generic drug:  bimatoprost Place 1 drop into both eyes daily.   Mometasone Furoate 200 MCG/ACT Aero Commonly known as:  ASMANEX HFA Inhale 2 puffs into the lungs 2 (two) times daily.   multivitamin with minerals Tabs tablet Take 1 tablet by mouth daily.   nystatin 100000 UNIT/ML suspension Commonly known as:  MYCOSTATIN USE 1 TEASPOON TO SWISH AND SWALLOW EVERY MORNING   omeprazole 20 MG capsule Commonly known as:  PRILOSEC TAKE ONE CAPSULE TWICE DAILY   ranitidine 300 MG tablet Commonly known as:  ZANTAC TAKE ONE TABLET EACH EVENING   RECLAST 5 MG/100ML Soln injection Generic drug:  zoledronic acid Inject 5 mg into the vein once. Once a year.   SIMPLY SALINE 0.9 % Aers Generic drug:  Saline Place 2 sprays into the nose daily.   TYLENOL PO Take by mouth as needed.   Vitamin D-3 1000 units Caps Take 1,000 Units by mouth daily.       Past Medical History:  Diagnosis Date  . Asthma   . GERD (gastroesophageal reflux disease)     Past Surgical  History:  Procedure Laterality Date  . Cervical decompression and fusion  1997  . ECTOPIC PREGNANCY SURGERY    . KNEE SURGERY    . nissan infundibulm    . THROAT SURGERY      Review of systems negative except as noted in HPI / PMHx or noted below:  Review of Systems  Constitutional: Negative.   HENT: Negative.   Eyes: Negative.   Respiratory: Negative.   Cardiovascular: Negative.   Gastrointestinal: Negative.   Genitourinary: Negative.   Musculoskeletal: Negative.   Skin: Negative.   Neurological: Negative.   Endo/Heme/Allergies: Negative.   Psychiatric/Behavioral: Negative.      Objective:    Vitals:   03/25/18 1041  BP: 116/66  Pulse: 68  Resp: 16          Physical Exam  Constitutional:  Raspy voice  HENT:  Head: Normocephalic.  Right Ear: Tympanic membrane, external ear and ear canal normal.  Left Ear: Tympanic membrane, external ear and ear canal normal.  Nose: Nose normal. No mucosal edema or rhinorrhea.  Mouth/Throat: Uvula is midline, oropharynx is clear and moist and mucous membranes are normal. No oropharyngeal exudate.  Eyes: Conjunctivae are normal.  Neck: Trachea normal. No tracheal tenderness present. No tracheal deviation present. No thyromegaly present.  Cardiovascular: Normal rate, regular rhythm, S1 normal, S2 normal and normal heart sounds.  No murmur heard. Pulmonary/Chest: Breath sounds normal. No stridor. No respiratory distress. She has no wheezes. She has no rales.  Musculoskeletal: She exhibits no edema.  Lymphadenopathy:       Head (right side): No tonsillar adenopathy present.       Head (left side): No tonsillar adenopathy present.    She has no cervical adenopathy.  Neurological: She is alert.  Skin: No rash noted. She is not diaphoretic. No erythema. Nails show no clubbing.    Diagnostics:    Spirometry was performed and demonstrated an FEV1 of 1.90 at 78 % of predicted.  The patient had an Asthma Control Test with the following results: ACT Total Score: 25.    Assessment and Plan:   1. Asthma, moderate persistent, well-controlled   2. Other allergic rhinitis   3. LPRD (laryngopharyngeal reflux disease)   4. Thrush, oral   5. Hoarseness     1. Continue Symbicort 160 2 inhalations twice a day and add Asmanex 200 HFA 2 inhalations twice a day during 'flare up'  2. Continue omeprazole 20 mg twice a day and ranitidine 300 mg in the evening  3. Continue nasal fluticasone one spray each nostril one time per day  4. Continue a combination of the following to address fungal disease:    A. Diflucan 100 one tablet every 2  weeks.  B. nystatin oral solution 1 teaspoon swish and swallow every morning  5. May use OTC Mucinex DM, Zyrtec, azelastine, and ProAir HFA as needed  6. Obtain ENT evaluation of throat for hoarseness  7. Return to clinic in November 2019 or earlier if problem  8. Obtain fall flu vaccine   Verdel [name] is doing very well on her current therapy but she has hoarseness.  I think we need to have her throat visualized by ENT to further investigate this issue.  She will continue to use anti-inflammatory agents for her respiratory tract and therapy directed against reflux as noted above and also continue on therapy for her chronic fungal overgrowth issue involving her oral cavity most likely secondary to the use of Symbicort.  I will regroup with  her November 2019 or earlier if there is a problem.  Laurette Schimke, MD Allergy / Immunology [facility]

## 2018-03-25 NOTE — Patient Instructions (Signed)
   1. Continue Symbicort 160 2 inhalations twice a day and add Asmanex 200 HFA 2 inhalations twice a day during 'flare up'  2. Continue omeprazole 20 mg twice a day and ranitidine 300 mg in the evening  3. Continue nasal fluticasone one spray each nostril one time per day  4. Continue a combination of the following to address fungal disease:    A. Diflucan 100 one tablet every 2 weeks.  B. nystatin oral solution 1 teaspoon swish and swallow every morning  5. May use OTC Mucinex DM, Zyrtec, azelastine, and ProAir HFA as needed  6. Obtain ENT evaluation of throat for hoarseness  7. Return to clinic in November 2019 or earlier if problem  8. Obtain fall flu vaccine

## 2018-03-25 NOTE — Telephone Encounter (Signed)
Referral to ENT for Throat Hoarseness per Dr Lucie LeatherKozlow please

## 2018-03-26 ENCOUNTER — Encounter: Payer: Self-pay | Admitting: Allergy and Immunology

## 2018-03-31 NOTE — Telephone Encounter (Signed)
Referral has been placed in proficient for Dr Luther Hearingeohs office.

## 2018-04-02 ENCOUNTER — Other Ambulatory Visit: Payer: Self-pay

## 2018-04-02 MED ORDER — BUDESONIDE-FORMOTEROL FUMARATE 160-4.5 MCG/ACT IN AERO: INHALATION_SPRAY | RESPIRATORY_TRACT | 1 refills | Status: DC

## 2018-04-11 DIAGNOSIS — M8589 Other specified disorders of bone density and structure, multiple sites: Secondary | ICD-10-CM | POA: Diagnosis not present

## 2018-04-21 DIAGNOSIS — M810 Age-related osteoporosis without current pathological fracture: Secondary | ICD-10-CM | POA: Diagnosis not present

## 2018-05-01 ENCOUNTER — Other Ambulatory Visit (HOSPITAL_COMMUNITY): Payer: Self-pay | Admitting: *Deleted

## 2018-05-02 ENCOUNTER — Ambulatory Visit (HOSPITAL_COMMUNITY)
Admission: RE | Admit: 2018-05-02 | Discharge: 2018-05-02 | Disposition: A | Discharge status: Home / Self Care | Payer: PPO | Source: Ambulatory Visit | Attending: Geriatric Medicine | Admitting: Geriatric Medicine

## 2018-05-02 DIAGNOSIS — M810 Age-related osteoporosis without current pathological fracture: Secondary | ICD-10-CM | POA: Diagnosis not present

## 2018-05-02 MED ORDER — ZOLEDRONIC ACID 5 MG/100ML IV SOLN
INTRAVENOUS | Status: AC
  Administered 2018-05-02: 5 mg
  Filled 2018-05-02: qty 100

## 2018-05-02 MED ORDER — ZOLEDRONIC ACID 5 MG/100ML IV SOLN: 5.0000 mg | Freq: Once | INTRAVENOUS | Status: DC

## 2018-05-13 DIAGNOSIS — K219 Gastro-esophageal reflux disease without esophagitis: Secondary | ICD-10-CM | POA: Diagnosis not present

## 2018-05-13 DIAGNOSIS — R490 Dysphonia: Secondary | ICD-10-CM | POA: Diagnosis not present

## 2018-05-14 DIAGNOSIS — Z23 Encounter for immunization: Secondary | ICD-10-CM | POA: Diagnosis not present

## 2018-06-03 DIAGNOSIS — Z9889 Other specified postprocedural states: Secondary | ICD-10-CM | POA: Diagnosis not present

## 2018-06-03 DIAGNOSIS — K219 Gastro-esophageal reflux disease without esophagitis: Secondary | ICD-10-CM | POA: Diagnosis not present

## 2018-06-09 DIAGNOSIS — K317 Polyp of stomach and duodenum: Secondary | ICD-10-CM | POA: Diagnosis not present

## 2018-06-09 DIAGNOSIS — K2930 Chronic superficial gastritis without bleeding: Secondary | ICD-10-CM | POA: Diagnosis not present

## 2018-06-09 DIAGNOSIS — R12 Heartburn: Secondary | ICD-10-CM | POA: Diagnosis not present

## 2018-06-12 DIAGNOSIS — K317 Polyp of stomach and duodenum: Secondary | ICD-10-CM | POA: Diagnosis not present

## 2018-06-12 DIAGNOSIS — K2930 Chronic superficial gastritis without bleeding: Secondary | ICD-10-CM | POA: Diagnosis not present

## 2018-07-08 ENCOUNTER — Encounter: Payer: Self-pay | Admitting: Allergy and Immunology

## 2018-07-08 ENCOUNTER — Ambulatory Visit (INDEPENDENT_AMBULATORY_CARE_PROVIDER_SITE_OTHER): Payer: PPO | Admitting: Allergy and Immunology

## 2018-07-08 VITALS — BP 106/60 | HR 72 | Resp 16 | Ht 67.75 in | Wt 122.0 lb

## 2018-07-08 DIAGNOSIS — B370 Candidal stomatitis: Secondary | ICD-10-CM

## 2018-07-08 DIAGNOSIS — J4540 Moderate persistent asthma, uncomplicated: Secondary | ICD-10-CM | POA: Diagnosis not present

## 2018-07-08 DIAGNOSIS — K219 Gastro-esophageal reflux disease without esophagitis: Secondary | ICD-10-CM

## 2018-07-08 DIAGNOSIS — J3089 Other allergic rhinitis: Secondary | ICD-10-CM

## 2018-07-08 MED ORDER — FLUCONAZOLE 100 MG PO TABS: 100.0000 mg | ORAL_TABLET | ORAL | 1 refills | Status: DC

## 2018-07-08 NOTE — Patient Instructions (Addendum)
   1. Continue Symbicort 160 2 inhalations twice a day and add Asmanex 200 HFA 2 inhalations twice a day during 'flare up'  2. Continue omeprazole 40 mg twice a day and famotidine 40 mg in the evening  3. Continue nasal fluticasone one spray each nostril one time per day  4. Continue a combination of the following to address fungal disease:    A. Diflucan 100 one tablet every 2 weeks.  B. nystatin oral solution 1 teaspoon swish and swallow every morning  5. May use OTC Mucinex DM, Zyrtec, azelastine, and ProAir HFA as needed  6. Return to clinic in 6 months or earlier if problem

## 2018-07-08 NOTE — Progress Notes (Signed)
Follow-up Note  Referring Provider: Merlene Laughter, MD Primary Provider: Merlene Laughter, MD Date of Office Visit: 07/08/2018  Subjective:   Victoria Trevino (DOB: 08-10-1941) is a 77 y.o. female [facility] in re-evaluation of the following:  HPI: Victoria Trevino presents to this clinic in reevaluation of asthma and allergic rhinitis and LPR.  Her last visit to this clinic was 25 March 2018.  Once again she has had an excellent interval of time since I have last seen her in this clinic without any significant problems with her respiratory tract, minimal requirement for short acting bronchodilator, and no limitation in ability to exercise, and without the administration of a systemic steroid or antibiotic to treat any type of respiratory tract issue.  She apparently had an upper endoscopy performed by Dr. Matthias Trevino at the beginning on October which identified continued esophagitis and she has had her omeprazole increased to 80 mg daily.  She did not have a ENT evaluation for her hoarseness but that has basically resolved.  Her chronic oral thrush is under excellent control on her current plan.  Allergies as of 07/08/2018   No Known Allergies     Medication List      albuterol 108 (90 Base) MCG/ACT inhaler Commonly known as:  PROVENTIL HFA;VENTOLIN HFA Inhale two puffs every four to six hours as needed for cough or wheeze.   antiseptic oral rinse Liqd 15 mLs by Mouth Rinse route 2 (two) times daily.   azelastine 0.1 % nasal spray Commonly known as:  ASTELIN Place 2 sprays into both nostrils 2 (two) times daily.   budesonide-formoterol 160-4.5 MCG/ACT inhaler Commonly known as:  SYMBICORT Inhale 2 puffs by mouth twice a day to prevent cough or wheezing.  Rinse, gargle, and spit after use.   CALCIUM + D [phone] MG-UNT-MCG Chew Generic drug:  Calcium-Vitamin D-Vitamin K Chew 1 tablet by mouth 2 (two) times daily. With lunch and dinner     cetirizine 10 MG tablet Commonly known as:  ZYRTEC Take 1 tablet (10 mg total) by mouth daily.   famotidine 20 MG tablet Commonly known as:  PEPCID   fluticasone 50 MCG/ACT nasal spray Commonly known as:  FLONASE Use one spray in each nostril once daily.   LUMIGAN 0.01 % Soln Generic drug:  bimatoprost Place 1 drop into both eyes daily.   Mometasone Furoate 200 MCG/ACT Aero Inhale 2 puffs into the lungs 2 (two) times daily.   multivitamin with minerals Tabs tablet Take 1 tablet by mouth daily.   nystatin 100000 UNIT/ML suspension Commonly known as:  MYCOSTATIN USE 1 TEASPOON TO SWISH AND SWALLOW EVERY MORNING   omeprazole 40 MG capsule Commonly known as:  PRILOSEC   ranitidine 300 MG tablet Commonly known as:  ZANTAC TAKE ONE TABLET EACH EVENING   RECLAST 5 MG/100ML Soln injection Generic drug:  zoledronic acid Inject 5 mg into the vein once. Once a year.   SIMPLY SALINE 0.9 % Aers Generic drug:  Saline Place 2 sprays into the nose daily.   TYLENOL PO Take by mouth as needed.   Vitamin D-3 1000 units Caps Take 1,000 Units by mouth daily.       Past Medical History:  Diagnosis Date  . Asthma   . GERD (gastroesophageal reflux disease)     Past Surgical History:  Procedure Laterality Date  . Cervical decompression and fusion  1997  . ECTOPIC PREGNANCY SURGERY    . KNEE  SURGERY    . nissan infundibulm    . THROAT SURGERY      Review of systems negative except as noted in HPI / PMHx or noted below:  Review of Systems  Constitutional: Negative.   HENT: Negative.   Eyes: Negative.   Respiratory: Negative.   Cardiovascular: Negative.   Gastrointestinal: Negative.   Genitourinary: Negative.   Musculoskeletal: Negative.   Skin: Negative.   Neurological: Negative.   Endo/Heme/Allergies: Negative.   Psychiatric/Behavioral: Negative.      Objective:   Vitals:   07/08/18 1008  BP: 106/60  Pulse: 72  Resp: 16   Height: 5' 7.75" (172.1 cm)   Weight: 122 lb (55.3 kg)   Physical Exam  HENT:  Head: Normocephalic.  Right Ear: Tympanic membrane, external ear and ear canal normal.  Left Ear: Tympanic membrane, external ear and ear canal normal.  Nose: Nose normal. No mucosal edema or rhinorrhea.  Mouth/Throat: Uvula is midline, oropharynx is clear and moist and mucous membranes are normal. No oropharyngeal exudate.  Eyes: Conjunctivae are normal.  Neck: Trachea normal. No tracheal tenderness present. No tracheal deviation present. No thyromegaly present.  Cardiovascular: Normal rate, regular rhythm, S1 normal, S2 normal and normal heart sounds.  No murmur heard. Pulmonary/Chest: Breath sounds normal. No stridor. No respiratory distress. She has no wheezes. She has no rales.  Musculoskeletal: She exhibits no edema.  Lymphadenopathy:       Head (right side): No tonsillar adenopathy present.       Head (left side): No tonsillar adenopathy present.    She has no cervical adenopathy.  Neurological: She is alert.  Skin: No rash noted. She is not diaphoretic. No erythema. Nails show no clubbing.    Diagnostics:    Spirometry was performed and demonstrated an FEV1 of 1.79 at 73 % of predicted.  The patient had an Asthma Control Test with the following results: ACT Total Score: 25.    Assessment and Plan:   1. Asthma, moderate persistent, well-controlled   2. Other allergic rhinitis   3. LPRD (laryngopharyngeal reflux disease)   4. Thrush, oral     1. Continue Symbicort 160 2 inhalations twice a day and add Asmanex 200 HFA 2 inhalations twice a day during 'flare up'  2. Continue omeprazole 40 mg twice a day and famotidine 40 mg in the evening  3. Continue nasal fluticasone one spray each nostril one time per day  4. Continue a combination of the following to address fungal disease:    A. Diflucan 100 one tablet every 2 weeks.  B. nystatin oral solution 1 teaspoon swish and swallow every morning  5. May use OTC Mucinex  DM, Zyrtec, azelastine, and ProAir HFA as needed  6. Return to clinic in 6 months or earlier if problem  Victoria Trevino is really doing very well on her current medical plan and she will continue to use anti-inflammatory medications for her respiratory track and aggressive therapy directed against reflux and fungal overgrowth and I will see her back in this clinic in 6 months or earlier if there is a problem.  Laurette Schimke, MD Allergy / Immunology [facility]

## 2018-07-09 ENCOUNTER — Encounter: Payer: Self-pay | Admitting: Allergy and Immunology

## 2018-07-28 DIAGNOSIS — H43813 Vitreous degeneration, bilateral: Secondary | ICD-10-CM | POA: Diagnosis not present

## 2018-07-28 DIAGNOSIS — H401213 Low-tension glaucoma, right eye, severe stage: Secondary | ICD-10-CM | POA: Diagnosis not present

## 2018-07-28 DIAGNOSIS — H524 Presbyopia: Secondary | ICD-10-CM | POA: Diagnosis not present

## 2018-07-28 DIAGNOSIS — H04123 Dry eye syndrome of bilateral lacrimal glands: Secondary | ICD-10-CM | POA: Diagnosis not present

## 2018-09-17 DIAGNOSIS — K219 Gastro-esophageal reflux disease without esophagitis: Secondary | ICD-10-CM | POA: Diagnosis not present

## 2018-09-19 ENCOUNTER — Other Ambulatory Visit: Payer: Self-pay | Admitting: Allergy and Immunology

## 2018-10-10 DIAGNOSIS — G250 Essential tremor: Secondary | ICD-10-CM | POA: Diagnosis not present

## 2018-10-15 ENCOUNTER — Other Ambulatory Visit: Payer: Self-pay | Admitting: *Deleted

## 2018-10-15 MED ORDER — BUDESONIDE-FORMOTEROL FUMARATE 160-4.5 MCG/ACT IN AERO: INHALATION_SPRAY | RESPIRATORY_TRACT | 1 refills | Status: DC

## 2018-10-20 DIAGNOSIS — H5310 Unspecified subjective visual disturbances: Secondary | ICD-10-CM | POA: Diagnosis not present

## 2018-10-23 ENCOUNTER — Other Ambulatory Visit (HOSPITAL_COMMUNITY): Payer: Self-pay | Admitting: Ophthalmology

## 2018-10-23 ENCOUNTER — Other Ambulatory Visit: Payer: Self-pay | Admitting: *Deleted

## 2018-10-23 DIAGNOSIS — H539 Unspecified visual disturbance: Secondary | ICD-10-CM

## 2018-10-23 MED ORDER — FLUTICASONE PROPIONATE 50 MCG/ACT NA SUSP: NASAL | 1 refills | Status: DC

## 2018-10-27 ENCOUNTER — Ambulatory Visit (HOSPITAL_COMMUNITY)
Admission: RE | Admit: 2018-10-27 | Discharge: 2018-10-27 | Disposition: A | Discharge status: Home / Self Care | Payer: PPO | Source: Ambulatory Visit | Attending: Ophthalmology | Admitting: Ophthalmology

## 2018-10-27 DIAGNOSIS — H539 Unspecified visual disturbance: Secondary | ICD-10-CM | POA: Insufficient documentation

## 2018-10-27 NOTE — Progress Notes (Signed)
Bilateral carotid duplex completed. Preliminary results in Chart review CV Proc. [employer], RVS 10/27/2018 3;28 PM

## 2018-12-17 DIAGNOSIS — R5383 Other fatigue: Secondary | ICD-10-CM | POA: Diagnosis not present

## 2018-12-17 DIAGNOSIS — G629 Polyneuropathy, unspecified: Secondary | ICD-10-CM | POA: Diagnosis not present

## 2018-12-18 DIAGNOSIS — G5783 Other specified mononeuropathies of bilateral lower limbs: Secondary | ICD-10-CM | POA: Diagnosis not present

## 2018-12-18 DIAGNOSIS — G629 Polyneuropathy, unspecified: Secondary | ICD-10-CM | POA: Diagnosis not present

## 2019-01-05 ENCOUNTER — Encounter: Payer: Self-pay | Admitting: Neurology

## 2019-01-05 ENCOUNTER — Encounter: Payer: Self-pay | Admitting: *Deleted

## 2019-01-05 ENCOUNTER — Other Ambulatory Visit: Payer: Self-pay

## 2019-01-05 ENCOUNTER — Telehealth (INDEPENDENT_AMBULATORY_CARE_PROVIDER_SITE_OTHER): Payer: PPO | Admitting: Neurology

## 2019-01-05 VITALS — Ht 67.75 in | Wt 118.0 lb

## 2019-01-05 DIAGNOSIS — G609 Hereditary and idiopathic neuropathy, unspecified: Secondary | ICD-10-CM

## 2019-01-05 NOTE — Progress Notes (Signed)
   Virtual Visit via Video Note The purpose of this virtual visit is to provide medical care while limiting exposure to the novel coronavirus.    Consent was obtained for video visit:  Yes.   Answered questions that patient had about telehealth interaction:  Yes.   I discussed the limitations, risks, security and privacy concerns of performing an evaluation and management service by telemedicine. I also discussed with the patient that there may be a patient responsible charge related to this service. The patient expressed understanding and agreed to proceed.  Pt location: Home Physician Location: office Name of referring provider:  Merlene Laughter, MD I connected with Victoria Trevino at patients initiation/request on 01/05/2019 at  9:00 AM EDT by video enabled telemedicine application and verified that I am speaking with the correct person using two identifiers. Pt MRN:  [id number] Pt DOB:  1941-04-26 Video Participants:  Victoria Trevino   History of Present Illness: This is a 78 y.o. female returning for follow-up of neuropathy.  Over the past year, she reports that her neuropathy has been stable and continues to involve her feet, ankles, and lower legs.  She does have mild pain associated with this and uses lidocaine as needed, which provides benefit.  She does not wish to be on an oral medication for pain.  She has been active and was exercising with a personal trainer until recent lockdown for coronavirus.  She has noticed stability in balance with these exercises.  She continues to walk unassisted.  The only other major change to her life is that her sister-in-law with Alzheimer's dementia is now living with her.  She appreciates the company, but also endorses the added responsibility to care for her.  Her son lives next door and checks on them often.  There are no home safety issues.   Observations/Objective:   Vitals:   01/05/19 0854  Weight: 118 lb (53.5 kg)  Height: 5' 7.75" (1.721 m)    Patient is awake, alert, and appears comfortable.  Oriented x 4.   Extraocular muscles are intact. No ptosis.  Face is symmetric.  Speech is not dysarthric. Tongue is midline. Antigravity in all extremities.  No pronator drift. She is able to stand up with arms crossed with ease. Gait appears normal, unassisted   Assessment and Plan:  Idiopathic peripheral neuropathy affecting the feet and lower legs, stable.  -Pain is adequately controlled with lidocaine ointment.  She does not wish to be on any oral medication  -I praised her for staying physically active.  She was encouraged to continue with balance exercises  -Fall precautions were discussed.   Follow Up Instructions:   I discussed the assessment and treatment plan with the patient. The patient was provided an opportunity to ask questions and all were answered. The patient agreed with the plan and demonstrated an understanding of the instructions.   The patient was advised to call back or seek an in-person evaluation if the symptoms worsen or if the condition fails to improve as anticipated.  Follow-up in 1 year or sooner as needed  Total time spent:  15 miinutes     [name], DO

## 2019-01-06 ENCOUNTER — Encounter: Payer: Self-pay | Admitting: Allergy and Immunology

## 2019-01-06 ENCOUNTER — Ambulatory Visit (INDEPENDENT_AMBULATORY_CARE_PROVIDER_SITE_OTHER): Payer: PPO | Admitting: Allergy and Immunology

## 2019-01-06 DIAGNOSIS — R0609 Other forms of dyspnea: Secondary | ICD-10-CM | POA: Diagnosis not present

## 2019-01-06 DIAGNOSIS — B370 Candidal stomatitis: Secondary | ICD-10-CM | POA: Diagnosis not present

## 2019-01-06 DIAGNOSIS — J4540 Moderate persistent asthma, uncomplicated: Secondary | ICD-10-CM | POA: Diagnosis not present

## 2019-01-06 DIAGNOSIS — J3089 Other allergic rhinitis: Secondary | ICD-10-CM | POA: Diagnosis not present

## 2019-01-06 DIAGNOSIS — K219 Gastro-esophageal reflux disease without esophagitis: Secondary | ICD-10-CM

## 2019-01-06 MED ORDER — FAMOTIDINE 40 MG PO TABS: ORAL_TABLET | ORAL | 5 refills | Status: DC

## 2019-01-06 MED ORDER — MOMETASONE FUROATE 200 MCG/ACT IN AERO: 2.0000 | INHALATION_SPRAY | Freq: Two times a day (BID) | RESPIRATORY_TRACT | 5 refills | Status: DC

## 2019-01-06 NOTE — Patient Instructions (Addendum)
   1. Continue Symbicort 160 - 2 inhalations twice a day    2. Continue omeprazole 40 mg twice a day and famotidine 40 mg in the evening  3. Continue nasal fluticasone one spray each nostril one time per day  4. Continue a combination of the following to address fungal disease:    A. Diflucan 100 one tablet every 2 weeks.  B. nystatin oral solution 1 teaspoon swish and swallow every morning  5. May use OTC Mucinex DM, Zyrtec, azelastine, and ProAir HFA as needed  6. Add Asmanex 200 HFA - 2 inhalations 2 times per day during flare up.   7. Refer to Cardiology for exercise test in investigate of exertional dyspnea  8. Return to clinic in 6 months or earlier if problem

## 2019-01-06 NOTE — Progress Notes (Signed)
- [place] [facility] [place] - [place] [facility] [place]   Follow-up Note  Referring Provider: Merlene Laughter, MD Primary Provider: Merlene Laughter, MD Date of Office Visit: 01/06/2019  Subjective:   Victoria Trevino (DOB: 04-06-1941) is a 78 y.o. female who [facility] 01[facility]/2019 in re-evaluation of the following:  HPI: This is a E - Med visit requested by patient who is located at home.  Victoria Trevino is followed in this clinic for asthma and allergic rhinitis and LPR and a history of recurrent thrush.  Her last visit to this clinic was 08 July 2018.  Asthma under good control on Symbicort twice a day. While working in garden and walking outdoors does have intermittent exertional sob that resolves in minutes with rest. Performs exercise at the gym in the past without a problem. Rare use of a SABA. Have not had to use Asmanex as part of "action plan".  Reflux under control while on PPI and H2 receptor blocker. Very little issue with throat.  Still requires the thrush therapy on a consistent basis.  Self isolating secondary to coronavirus pandemic.  Allergies as of 01/06/2019   No Known Allergies     Medication List      azelastine 0.1 % nasal spray Commonly known as:  ASTELIN Place 2 sprays into both nostrils 2 (two) times daily.   budesonide-formoterol 160-4.5 MCG/ACT inhaler Commonly known as:  Symbicort Inhale 2 puffs by mouth twice a day to prevent cough or wheezing.  Rinse, gargle, and spit after use.   cetirizine 10 MG tablet Commonly known as:  ZYRTEC Take 1 tablet (10 mg total) by mouth daily.   famotidine 20 MG tablet Commonly known as:  PEPCID at bedtime.   fluconazole 100 MG tablet Commonly known as:  DIFLUCAN Take 1 tablet (100 mg total) by mouth every 14 (fourteen) days.   fluticasone 50 MCG/ACT nasal spray Commonly known as:  FLONASE Use one spray in each nostril once daily.   nystatin 100000 UNIT/ML suspension Commonly  known as:  MYCOSTATIN USE 1 TEASPOON TO SWISH AND SWALLOW EVERY MORNING   omeprazole 40 MG capsule Commonly known as:  PRILOSEC 2 (two) times a day. Patient takes 2 po bid.   Simply Saline 0.9 % Aers Generic drug:  Saline Place 2 sprays into the nose 2 (two) times a day.   TYLENOL PO Take by mouth as needed.   Vitamin D-3 25 MCG (1000 UT) Caps Take 1,000 Units by mouth daily.       Past Medical History:  Diagnosis Date  . Asthma   . GERD (gastroesophageal reflux disease)     Past Surgical History:  Procedure Laterality Date  . Cervical decompression and fusion  1997  . ECTOPIC PREGNANCY SURGERY    . KNEE SURGERY    . nissan infundibulm    . THROAT SURGERY      Review of systems negative except as noted in HPI / PMHx or noted below:  Review of Systems  Constitutional: Negative.   HENT: Negative.   Eyes: Negative.   Respiratory: Negative.   Cardiovascular: Negative.   Gastrointestinal: Negative.   Genitourinary: Negative.   Musculoskeletal: Negative.   Skin: Negative.   Neurological: Negative.   Endo/Heme/Allergies: Negative.   Psychiatric/Behavioral: Negative.      Objective:   There were no vitals filed for this visit.        Physical Exam-deferred  Diagnostics: none  Assessment and Plan:  1. Asthma, moderate persistent, well-controlled   2. Other allergic rhinitis   3. LPRD (laryngopharyngeal reflux disease)   4. Thrush, oral   5. Exertional dyspnea     1. Continue Symbicort 160 - 2 inhalations twice a day    2. Continue omeprazole 40 mg twice a day and famotidine 40 mg in the evening  3. Continue nasal fluticasone one spray each nostril one time per day  4. Continue a combination of the following to address fungal disease:    A. Diflucan 100 one tablet every 2 weeks.  B. nystatin oral solution 1 teaspoon swish and swallow every morning  5. May use OTC Mucinex DM, Zyrtec, azelastine, and ProAir HFA as needed  6. Add Asmanex 200  HFA - 2 inhalations 2 times per day during flare up.   7. Refer to Cardiology for exercise test in investigate of exertional dyspnea  8. Return to clinic in 6 months or earlier if problem  Victoria Trevino appears to be doing pretty well regarding her eosinophilic driven respiratory tract disease and her reflux induced respiratory disease and her chronic thrush on her current therapy.  What is new is the development of significant dyspnea when she exerts herself which has a very quick recovery time upon resting.  I think we are obligated to make sure that she does not have a different issue other than limited airflow contributing to these symptoms and I think it would be best for her to undergo a myocardial perfusion scan.  We will refer her onto cardiology for their consideration regarding evaluation of her exertional dyspnea.  Laurette SchimkeEric Nimra Puccinelli, MD Allergy / Immunology [facility]

## 2019-01-06 NOTE — Progress Notes (Signed)
Patient is at home.  Provider is in the office.  Verbal Consent received from patient. Start Time: 1023 am End Time: 1118 am

## 2019-01-07 ENCOUNTER — Encounter: Payer: Self-pay | Admitting: Allergy and Immunology

## 2019-01-07 ENCOUNTER — Telehealth: Payer: Self-pay

## 2019-01-07 NOTE — Telephone Encounter (Signed)
Referral has been placed to [facility]. They will contact the patient to schedule. Will follow back up in a few days.  I have informed the patient.    Thanks

## 2019-01-07 NOTE — Telephone Encounter (Signed)
-----   Message from Tawny Hopping, [place] sent at 01/06/2019 11:19 AM EDT ----- Regarding: Cardiology Referral Hey! Dr. Lucie Leather would like this patient to see cardiology for a exercise test in investigation of exertional dyspnea. Can you take care of this please!   Thank you much!

## 2019-01-19 ENCOUNTER — Encounter: Payer: Self-pay | Admitting: Cardiology

## 2019-01-19 ENCOUNTER — Other Ambulatory Visit: Payer: Self-pay

## 2019-01-19 ENCOUNTER — Telehealth (INDEPENDENT_AMBULATORY_CARE_PROVIDER_SITE_OTHER): Payer: PPO | Admitting: Cardiology

## 2019-01-19 VITALS — BP 124/72 | Ht 67.75 in | Wt 118.0 lb

## 2019-01-19 DIAGNOSIS — R0602 Shortness of breath: Secondary | ICD-10-CM | POA: Diagnosis not present

## 2019-01-19 DIAGNOSIS — Z01812 Encounter for preprocedural laboratory examination: Secondary | ICD-10-CM

## 2019-01-19 DIAGNOSIS — Z8249 Family history of ischemic heart disease and other diseases of the circulatory system: Secondary | ICD-10-CM

## 2019-01-19 NOTE — Patient Instructions (Addendum)
Medication Instructions:  The current medical regimen is effective;  continue present plan and medications.  If you need a refill on your cardiac medications before your next appointment, please call your pharmacy.   Lab work: Please have blood work before your CT scan.  (BMP)  If you have labs (blood work) drawn today and your tests are completely normal, you will receive your results only by: Marland Kitchen MyChart Message (if you have MyChart) OR . A paper copy in the mail If you have any lab test that is abnormal or we need to change your treatment, we will call you to review the results.  Testing/Procedures: Your physician has requested that you have an echocardiogram. Echocardiography is a painless test that uses sound waves to create images of your heart. It provides your doctor with information about the size and shape of your heart and how well your heart's chambers and valves are working. This procedure takes approximately one hour. There are no restrictions for this procedure.  This test is completed at out [address] [employer] location.  Your physician has requested that you have cardiac CT. Cardiac computed tomography (CT) is a painless test that uses an x-ray machine to take clear, detailed pictures of your heart. . Please follow instruction sheet as given. (See below)  You will be contacted to be scheduled for both of the above listed tests.  Follow-Up: Further follow up will be based on the results of the above testing.  Thank you for choosing [facility]!!     Please arrive at the [facility] main entrance of [facility] at xx:xx AM (30-45 minutes prior to test start time)  [facility] [address] [place], [place] [address] [phone]  Proceed to the [facility] Radiology Department (First Floor).  Please follow these instructions carefully (unless otherwise directed):  On the Night Before the Test: . Be sure to Drink plenty of water. . Do  not consume any caffeinated/decaffeinated beverages or chocolate 12 hours prior to your test. . Do not take any antihistamines 12 hours prior to your test. . If you take Metformin do not take 24 hours prior to test.  On the Day of the Test: . Drink plenty of water. Do not drink any water within one hour of the test. . Do not eat any food 4 hours prior to the test. . You may take your regular medications prior to the test.  . Take metoprolol (Lopressor) two hours prior to test.  Since you have asthma this is not indicated. Marland Kitchen HOLD Furosemide/Hydrochlorothiazide morning of the test.      After the Test: . Drink plenty of water. . After receiving IV contrast, you may experience a mild flushed feeling. This is normal. . On occasion, you may experience a mild rash up to 24 hours after the test. This is not dangerous. If this occurs, you can take Benadryl 25 mg and increase your fluid intake. . If you experience trouble breathing, this can be serious. If it is severe call 911 IMMEDIATELY. If it is mild, please call our office.

## 2019-01-19 NOTE — Progress Notes (Signed)
Virtual Visit via Video Note   This visit type was conducted due to national recommendations for restrictions regarding the COVID-19 Pandemic (e.g. social distancing) in an effort to limit this patient's exposure and mitigate transmission in our community.  Due to her co-morbid illnesses, this patient is at least at moderate risk for complications without adequate follow up.  This format is felt to be most appropriate for this patient at this time.  All issues noted in this document were discussed and addressed.  A limited physical exam was performed with this format.  Please refer to the patient's chart for her consent to telehealth for [facility].   Date:  01/19/2019   ID:  Victoria Trevino, DOB 06/19/1941, MRN [id number]  Patient Location: Home Provider Location: Home  PCP:  Merlene LaughterStoneking, Hal, MD  Cardiologist:  Donato SchultzMark Joliana Claflin, MD  Electrophysiologist:  None   Evaluation Performed:  New Patient Evaluation  Chief Complaint: Dr. Lucie LeatherKozlow requested stress test and investigation of exertional dyspnea  History of Present Illness:    Victoria Trevino is a 78 y.o. female with asthma under good control with Symbicort here for evaluation of intermittent exertional shortness of breath that seems to resolve with rash after several minutes.  CLeaning up garden getting ready to plant. Incline, with climbing, needed to stop to get breath. Recovered. Cleared up right away. Dr. Lucie LeatherKozlow thought it might be heart   Her reflux is under good control after 4 months with PPI.  Occasional thrush.  Enjoy gym one hour per week, gym. Catch breath.  No DM, no tobacco. Mother had Alzheimers, 105. Doing [employer] research. Father had heart issues, died at 6176. MI 3256.   Has been seen in neurology, most recently 01/05/2019 via telemedicine with idiopathic peripheral neuropathy affecting the feet and lower legs treated with lidocaine ointment.  Physical activity.  Prior carotid Dopplers were normal  The patient does not have  symptoms concerning for COVID-19 infection (fever, chills, cough, or new shortness of breath).    Past Medical History:  Diagnosis Date  . Allergic rhinoconjunctivitis 08/02/2015  . Asthma   . Asthma, well controlled 10/31/2017  . GERD (gastroesophageal reflux disease)   . Hereditary and idiopathic peripheral neuropathy 02/16/2016  . Left wrist injury 01/18/2017  . LPRD (laryngopharyngeal reflux disease) 08/02/2015  . Moderate persistent asthma 08/02/2015  . Thrush, oral 10/31/2017   Past Surgical History:  Procedure Laterality Date  . Cervical decompression and fusion  1997  . ECTOPIC PREGNANCY SURGERY    . KNEE SURGERY    . nissan infundibulm    . THROAT SURGERY       Current Meds  Medication Sig  . Acetaminophen (TYLENOL PO) Take by mouth as needed.  Marland Kitchen. azelastine (ASTELIN) 0.1 % nasal spray Place 2 sprays into both nostrils 2 (two) times daily.  . budesonide-formoterol (SYMBICORT) 160-4.5 MCG/ACT inhaler Inhale 2 puffs by mouth twice a day to prevent cough or wheezing.  Rinse, gargle, and spit after use.  . cetirizine (ZYRTEC) 10 MG tablet Take 1 tablet (10 mg total) by mouth daily.  . Cholecalciferol (VITAMIN D-3) 1000 UNITS CAPS Take 1,000 Units by mouth daily.  . famotidine (PEPCID) 40 MG tablet Take 1 tablet by mouth once daily in the evening  . fluconazole (DIFLUCAN) 100 MG tablet Take 1 tablet (100 mg total) by mouth every 14 (fourteen) days.  . fluticasone (FLONASE) 50 MCG/ACT nasal spray Use one spray in each nostril once daily.  . Mometasone Furoate [facility](ASMANEX HFA)  200 MCG/ACT AERO Inhale 2 puffs into the lungs 2 (two) times daily.  Marland Kitchen nystatin (MYCOSTATIN) 100000 UNIT/ML suspension USE 1 TEASPOON TO SWISH AND SWALLOW EVERY MORNING  . omeprazole (PRILOSEC) 40 MG capsule Take 40 mg by mouth 2 (two) times a day.   . Saline (SIMPLY SALINE) 0.9 % AERS Place 2 sprays into the nose 2 (two) times a day.      Allergies:   Patient has no known allergies.   Social History    Tobacco Use  . Smoking status: Never Smoker  . Smokeless tobacco: Never Used  Substance Use Topics  . Alcohol use: No  . Drug use: No     Family Hx: The patient's family history includes Allergic rhinitis in her sister; Alzheimer's disease in her mother; Aneurysm in her brother; Congestive Heart Failure in her father; Emphysema in her father; Heart attack in her father; Prostate cancer in her maternal grandfather; Skin cancer in her brother and sister; Stroke in her maternal grandmother.  ROS:   Please see the history of present illness.    No CP All other systems reviewed and are negative.   Prior CV studies:   The following studies were reviewed today:  Carotid Dopplers 10/27/2018: -No carotid artery disease.  Labs/Other Tests and Data Reviewed:    EKG:  No prior EKG was available for me to review.  She states that she has had these in the past and reportedly normal  Recent Labs: No results found for requested labs within last 8760 hours.   Recent Lipid Panel No results found for: CHOL, TRIG, HDL, CHOLHDL, LDLCALC, LDLDIRECT  Wt Readings from Last 3 Encounters:  01/19/19 118 lb (53.5 kg)  01/05/19 118 lb (53.5 kg)  07/08/18 122 lb (55.3 kg)     Objective:    Vital Signs:  BP 124/72   Ht 5' 7.75" (1.721 m)   Wt 118 lb (53.5 kg)   BMI 18.07 kg/m    VITAL SIGNS:  reviewed GEN:  no acute distress EYES:  sclerae anicteric, EOMI - Extraocular Movements Intact RESPIRATORY:  normal respiratory effort, symmetric expansion SKIN:  no rash, lesions or ulcers. MUSCULOSKELETAL:  no obvious deformities. NEURO:  alert and oriented x 3, no obvious focal deficit PSYCH:  normal affect  ASSESSMENT & PLAN:    Dyspnea on exertion/possible angina equivalent - Noticeable dyspnea on exertion, when traveling up inclines on hill for instance, previously working with her trainer at the gym.  This is different for her, change.  Her father did have a heart attack in his 16s.  The  symptoms could potentially be indicative of her manifestation of angina. - I would like to obtain an echocardiogram to ensure proper structure and function of her heart - I would also like to obtain a coronary CTA with FFR analysis given this anginal equivalent to ensure that she does not have any significant flow-limiting coronary artery disease.  Family history of CAD -Father with myocardial infarction in his 20s.  Asthma - Her symptoms are different from her asthma.  Her asthma seems to be under good control with Dr. Lucie Leather and Dr. Pete Glatter.  COVID-19 Education: The signs and symptoms of COVID-19 were discussed with the patient and how to seek care for testing (follow up with PCP or arrange E-visit).  The importance of social distancing was discussed today.  Time:   Today, I have spent 25 minutes with the patient with telehealth technology discussing the above problems.     Medication  Adjustments/Labs and Tests Ordered: Current medicines are reviewed at length with the patient today.  Concerns regarding medicines are outlined above.   Tests Ordered: Orders Placed This Encounter  Procedures  . CT CORONARY MORPH W/CTA COR W/SCORE W/CA W/CM &/OR WO/CM  . CT CORONARY FRACTIONAL FLOW RESERVE DATA PREP  . CT CORONARY FRACTIONAL FLOW RESERVE FLUID ANALYSIS  . Basic metabolic panel  . ECHOCARDIOGRAM COMPLETE    Medication Changes: No orders of the defined types were placed in this encounter.   Disposition:  Follow up prn.   Signed, Donato Schultz, MD  01/19/2019 11:24 AM    [facility]

## 2019-01-22 ENCOUNTER — Telehealth: Payer: Self-pay | Admitting: Cardiology

## 2019-01-22 NOTE — Telephone Encounter (Signed)
Victoria Trevino is calling because she is ordered an echo and would like to know when it will be scheduled.  She has other appointments to work around. She is unavailable on 5/26 in the morning and 5/29 in the afternoon due to other doctor appointments.  Also, Wednesdays in June she already has appointments.  Adv I would forward to echo to inform for scheduling.

## 2019-01-22 NOTE — Telephone Encounter (Signed)
Pt called following up on some test she was told she may have this week by the doctor.  Pt states she knows one of them will be done at the hosp.  She is concerned abut the one she should have done this week.  Pt is requesting a call back please.

## 2019-01-27 ENCOUNTER — Other Ambulatory Visit: Payer: Self-pay | Admitting: Allergy and Immunology

## 2019-01-30 DIAGNOSIS — Z0000 Encounter for general adult medical examination without abnormal findings: Secondary | ICD-10-CM | POA: Diagnosis not present

## 2019-01-30 DIAGNOSIS — R5383 Other fatigue: Secondary | ICD-10-CM | POA: Diagnosis not present

## 2019-01-30 DIAGNOSIS — Z1389 Encounter for screening for other disorder: Secondary | ICD-10-CM | POA: Diagnosis not present

## 2019-02-05 ENCOUNTER — Telehealth (HOSPITAL_COMMUNITY): Payer: Self-pay | Admitting: Emergency Medicine

## 2019-02-05 NOTE — Telephone Encounter (Signed)
Reaching out to patient to offer assistance regarding upcoming cardiac imaging study; pt verbalizes understanding of appt date/time, parking situation and where to check in, pre-test NPO status and medications ordered, and verified current allergies; name and call back number provided for further questions should they arise Rockwell Alexandria RN Navigator Cardiac Imaging [facility] [phone] office [phone] cell  Pt denies covid symptoms, verbalized understanding of visitor policy.  Pt states she will have labs drawn tomorrow (6/5)

## 2019-02-06 ENCOUNTER — Other Ambulatory Visit: Payer: Self-pay

## 2019-02-06 ENCOUNTER — Other Ambulatory Visit: Payer: PPO | Admitting: *Deleted

## 2019-02-06 DIAGNOSIS — Z8249 Family history of ischemic heart disease and other diseases of the circulatory system: Secondary | ICD-10-CM | POA: Diagnosis not present

## 2019-02-06 DIAGNOSIS — Z01812 Encounter for preprocedural laboratory examination: Secondary | ICD-10-CM | POA: Diagnosis not present

## 2019-02-06 DIAGNOSIS — R0602 Shortness of breath: Secondary | ICD-10-CM | POA: Diagnosis not present

## 2019-02-06 LAB — BASIC METABOLIC PANEL
BUN/Creatinine Ratio: 19 (ref 12–28)
BUN: 18 mg/dL (ref 8–27)
CO2: 22 mmol/L (ref 20–29)
Calcium: 9.9 mg/dL (ref 8.7–10.3)
Chloride: 105 mmol/L (ref 96–106)
Creatinine, Ser: 0.93 mg/dL (ref 0.57–1.00)
GFR calc Af Amer: 68 mL/min/{1.73_m2} (ref >59)
GFR calc non Af Amer: 59 mL/min/{1.73_m2} — ABNORMAL LOW (ref >59)
Glucose: 93 mg/dL (ref 65–99)
Potassium: 4.3 mmol/L (ref 3.5–5.2)
Sodium: 142 mmol/L (ref 134–144)

## 2019-02-09 ENCOUNTER — Other Ambulatory Visit: Payer: Self-pay

## 2019-02-09 ENCOUNTER — Ambulatory Visit (HOSPITAL_COMMUNITY)
Admission: RE | Admit: 2019-02-09 | Discharge: 2019-02-09 | Disposition: A | Discharge status: Home / Self Care | Payer: PPO | Source: Ambulatory Visit | Attending: Cardiology | Admitting: Cardiology

## 2019-02-09 ENCOUNTER — Ambulatory Visit (HOSPITAL_COMMUNITY): Payer: PPO

## 2019-02-09 ENCOUNTER — Telehealth: Payer: Self-pay

## 2019-02-09 ENCOUNTER — Encounter (HOSPITAL_COMMUNITY): Payer: Self-pay

## 2019-02-09 DIAGNOSIS — Z8249 Family history of ischemic heart disease and other diseases of the circulatory system: Secondary | ICD-10-CM | POA: Insufficient documentation

## 2019-02-09 DIAGNOSIS — I2510 Atherosclerotic heart disease of native coronary artery without angina pectoris: Secondary | ICD-10-CM | POA: Diagnosis not present

## 2019-02-09 DIAGNOSIS — R0602 Shortness of breath: Secondary | ICD-10-CM | POA: Diagnosis not present

## 2019-02-09 MED ORDER — NITROGLYCERIN 0.4 MG SL SUBL
0.8000 mg | SUBLINGUAL_TABLET | Freq: Once | SUBLINGUAL | Status: AC
  Administered 2019-02-09: 0.8 mg via SUBLINGUAL
  Filled 2019-02-09: qty 25

## 2019-02-09 MED ORDER — METOPROLOL TARTRATE 5 MG/5ML IV SOLN
INTRAVENOUS | Status: AC
  Filled 2019-02-09: qty 20

## 2019-02-09 MED ORDER — NITROGLYCERIN 0.4 MG SL SUBL
SUBLINGUAL_TABLET | SUBLINGUAL | Status: AC
  Filled 2019-02-09: qty 2

## 2019-02-09 MED ORDER — IOHEXOL 350 MG/ML SOLN
100.0000 mL | Freq: Once | INTRAVENOUS | Status: AC | PRN
  Administered 2019-02-09: 100 mL via INTRAVENOUS

## 2019-02-09 MED ORDER — METOPROLOL TARTRATE 5 MG/5ML IV SOLN
5.0000 mg | INTRAVENOUS | Status: DC | PRN
  Administered 2019-02-09: 5 mg via INTRAVENOUS
  Filled 2019-02-09: qty 5

## 2019-02-09 NOTE — Progress Notes (Signed)
CT scan completed. Tolerated well. D/C home walking, awake and alert. In no distress. 

## 2019-02-09 NOTE — Telephone Encounter (Signed)
-----   Message from Jerline Pain, MD sent at 02/09/2019  6:37 AM EDT ----- Excellent labs. OK for CT.  Candee Furbish, MD

## 2019-02-09 NOTE — Telephone Encounter (Signed)
Notes recorded by Frederik Schmidt, RN on 02/09/2019 at 9:44 AM EDT Lpm about results. If questions, she may call. 6/8 ------

## 2019-02-10 ENCOUNTER — Other Ambulatory Visit: Payer: Self-pay | Admitting: *Deleted

## 2019-02-10 DIAGNOSIS — R931 Abnormal findings on diagnostic imaging of heart and coronary circulation: Secondary | ICD-10-CM

## 2019-02-10 DIAGNOSIS — Z79899 Other long term (current) drug therapy: Secondary | ICD-10-CM

## 2019-02-10 MED ORDER — ASPIRIN EC 81 MG PO TBEC: 81.0000 mg | DELAYED_RELEASE_TABLET | Freq: Every day | ORAL | Status: DC

## 2019-02-10 MED ORDER — ATORVASTATIN CALCIUM 20 MG PO TABS: 20.0000 mg | ORAL_TABLET | Freq: Every day | ORAL | 3 refills | Status: DC

## 2019-02-11 DIAGNOSIS — R490 Dysphonia: Secondary | ICD-10-CM | POA: Diagnosis not present

## 2019-02-11 DIAGNOSIS — K219 Gastro-esophageal reflux disease without esophagitis: Secondary | ICD-10-CM | POA: Diagnosis not present

## 2019-02-12 ENCOUNTER — Other Ambulatory Visit: Payer: Self-pay | Admitting: Geriatric Medicine

## 2019-02-12 DIAGNOSIS — Z1231 Encounter for screening mammogram for malignant neoplasm of breast: Secondary | ICD-10-CM

## 2019-02-13 ENCOUNTER — Telehealth (HOSPITAL_COMMUNITY): Payer: Self-pay | Admitting: Radiology

## 2019-02-13 NOTE — Telephone Encounter (Signed)

## 2019-02-16 ENCOUNTER — Ambulatory Visit (HOSPITAL_COMMUNITY): Payer: PPO | Attending: Cardiovascular Disease

## 2019-02-16 ENCOUNTER — Other Ambulatory Visit: Payer: Self-pay

## 2019-02-16 DIAGNOSIS — R0602 Shortness of breath: Secondary | ICD-10-CM | POA: Insufficient documentation

## 2019-02-16 DIAGNOSIS — Z8249 Family history of ischemic heart disease and other diseases of the circulatory system: Secondary | ICD-10-CM | POA: Insufficient documentation

## 2019-02-18 ENCOUNTER — Ambulatory Visit: Payer: PPO | Admitting: Neurology

## 2019-03-05 ENCOUNTER — Encounter: Payer: Self-pay | Admitting: Orthopedic Surgery

## 2019-03-05 ENCOUNTER — Ambulatory Visit (INDEPENDENT_AMBULATORY_CARE_PROVIDER_SITE_OTHER): Payer: PPO

## 2019-03-05 ENCOUNTER — Other Ambulatory Visit: Payer: Self-pay

## 2019-03-05 ENCOUNTER — Ambulatory Visit (INDEPENDENT_AMBULATORY_CARE_PROVIDER_SITE_OTHER): Payer: PPO | Admitting: Orthopedic Surgery

## 2019-03-05 VITALS — Ht 67.0 in | Wt 118.0 lb

## 2019-03-05 DIAGNOSIS — M25561 Pain in right knee: Secondary | ICD-10-CM

## 2019-03-06 ENCOUNTER — Encounter: Payer: Self-pay | Admitting: Orthopedic Surgery

## 2019-03-06 DIAGNOSIS — M25561 Pain in right knee: Secondary | ICD-10-CM | POA: Diagnosis not present

## 2019-03-06 MED ORDER — METHYLPREDNISOLONE ACETATE 40 MG/ML IJ SUSP
40.0000 mg | INTRAMUSCULAR | Status: AC | PRN
  Administered 2019-03-06: 11:00:00 40 mg via INTRA_ARTICULAR

## 2019-03-06 MED ORDER — LIDOCAINE HCL 1 % IJ SOLN
5.0000 mL | INTRAMUSCULAR | Status: AC | PRN
  Administered 2019-03-06: 5 mL

## 2019-03-06 NOTE — Progress Notes (Signed)
Office Visit Note   Patient: Victoria Trevino           Date of Birth: 1941-05-26           MRN: [id number] Visit Date: 03/05/2019              Requested by: Lajean Manes, MD [address] [place],  [place] [address] PCP: Lajean Manes, MD  Chief Complaint  Patient presents with  . Right Knee - Pain      HPI: Patient is a 78 year old woman who presents complaining of right knee pain.  She states that 2 weeks ago she tripped going up the steps and struck her knee.  She states that her knee feels just the way it did when she previously had a patella fracture she states the pain is getting worse.  Assessment & Plan: Visit Diagnoses:  1. Acute pain of right knee     Plan: Her knee was injected she tolerated this well increase her activities as tolerated.  Follow-Up Instructions: Return if symptoms worsen or fail to improve.   Ortho Exam  Patient is alert, oriented, no adenopathy, well-dressed, normal affect, normal respiratory effort. Examination patient has full active extension and flexion there is no effusion collaterals and cruciates are stable there is crepitation with range of motion the patella tracks midline.  There is pain to palpation of the patellofemoral joint as well as the medial and lateral joint line.  Radiographs shows no fracture.  Imaging: No results found. No images are attached to the encounter.  Labs: Lab Results  Component Value Date   ESRSEDRATE 11 02/16/2016     No results found for: ALBUMIN, PREALBUMIN, LABURIC  No results found for: MG No results found for: VD25OH  No results found for: PREALBUMIN No flowsheet data found.   Body mass index is 18.48 kg/m.  Orders:  Orders Placed This Encounter  Procedures  . XR Knee 1-2 Views Right   No orders of the defined types were placed in this encounter.    Procedures: Large Joint Inj: R knee on 03/06/2019 11:13 AM Indications: pain and diagnostic evaluation Details: 22 G 1.5  in needle, anteromedial approach  Arthrogram: No  Medications: 5 mL lidocaine 1 %; 40 mg methylPREDNISolone acetate 40 MG/ML Outcome: tolerated well, no immediate complications Procedure, treatment alternatives, risks and benefits explained, specific risks discussed. Consent was given by the patient. Immediately prior to procedure a time out was called to verify the correct patient, procedure, equipment, support staff and site/side marked as required. Patient was prepped and draped in the usual sterile fashion.      Clinical Data: No additional findings.  ROS:  All other systems negative, except as noted in the HPI. Review of Systems  Objective: Vital Signs: Ht 5\' 7"  (1.702 m)   Wt 118 lb (53.5 kg)   BMI 18.48 kg/m   Specialty Comments:  No specialty comments available.  PMFS History: Patient Active Problem List   Diagnosis Date Noted  . Asthma, well controlled 10/31/2017  . Thrush, oral 10/31/2017  . Left wrist injury 01/18/2017  . Hereditary and idiopathic peripheral neuropathy 02/16/2016  . LPRD (laryngopharyngeal reflux disease) 08/02/2015  . Allergic rhinoconjunctivitis 08/02/2015  . Moderate persistent asthma 08/02/2015   Past Medical History:  Diagnosis Date  . Allergic rhinoconjunctivitis 08/02/2015  . Asthma   . Asthma, well controlled 10/31/2017  . GERD (gastroesophageal reflux disease)   . Hereditary and idiopathic peripheral neuropathy 02/16/2016  . Left  wrist injury 01/18/2017  . LPRD (laryngopharyngeal reflux disease) 08/02/2015  . Moderate persistent asthma 08/02/2015  . Thrush, oral 10/31/2017    Family History  Problem Relation Age of Onset  . Alzheimer's disease Mother        Deceased, [address]5  . Heart attack Father   . Congestive Heart Failure Father        Deceased, 9278  . Emphysema Father   . Skin cancer Sister   . Allergic rhinitis Sister   . Aneurysm Brother   . Skin cancer Brother   . Stroke Maternal Grandmother   . Prostate cancer  Maternal Grandfather     Past Surgical History:  Procedure Laterality Date  . Cervical decompression and fusion  1997  . ECTOPIC PREGNANCY SURGERY    . KNEE SURGERY    . nissan infundibulm    . THROAT SURGERY     Social History   Occupational History  . Not on file  Tobacco Use  . Smoking status: Never Smoker  . Smokeless tobacco: Never Used  Substance and Sexual Activity  . Alcohol use: No  . Drug use: No  . Sexual activity: Yes    Birth control/protection: Condom

## 2019-03-23 ENCOUNTER — Telehealth: Payer: Self-pay | Admitting: Cardiology

## 2019-03-23 MED ORDER — ROSUVASTATIN CALCIUM 5 MG PO TABS: 5.0000 mg | ORAL_TABLET | Freq: Every day | ORAL | 2 refills | Status: DC

## 2019-03-23 NOTE — Telephone Encounter (Signed)
New Message   Pt c/o medication issue:  1. Name of Medication: atorvastatin (LIPITOR) 20 MG tablet    2. How are you currently taking this medication (dosage and times per day)?   3. Are you having a reaction (difficulty breathing--STAT)?   4. What is your medication issue? Patient states that she can not tolerate this medication. She states that it makes her very dizzy, loss of balance, she almost feel and episodes of vertigo. She has stopped taking.

## 2019-03-23 NOTE — Telephone Encounter (Signed)
Spoke with pt who reports having a lot of dizziness with Atorvastatin.   She stopped it several weeks ago.  She is willing to give Crestor a try since her sister has been able to tolerate it.  Advised I will notify Dr Marlou Porch for orders.  Pt would like to start with a 30 day supply only - sent into [facility].

## 2019-03-23 NOTE — Telephone Encounter (Signed)
Reviewed with Dr Marlou Porch who gives orders to start Crestor 5 mg and have Lipid/ALT checked in 2 months.  RX will be sent into pharmacy as requested.  Pt is aware to let us know if she is not able to tolerate medication.  Pt is aware and agreeable.

## 2019-03-24 ENCOUNTER — Other Ambulatory Visit: Payer: Self-pay | Admitting: *Deleted

## 2019-03-24 MED ORDER — BUDESONIDE-FORMOTEROL FUMARATE 160-4.5 MCG/ACT IN AERO: INHALATION_SPRAY | RESPIRATORY_TRACT | 1 refills | Status: DC

## 2019-03-24 NOTE — Telephone Encounter (Signed)
Patient came in with letter from insurance needs refills on symbicort writer sent in refills

## 2019-04-02 ENCOUNTER — Ambulatory Visit: Payer: PPO

## 2019-04-02 ENCOUNTER — Other Ambulatory Visit: Payer: Self-pay

## 2019-04-02 ENCOUNTER — Ambulatory Visit
Admission: RE | Admit: 2019-04-02 | Discharge: 2019-04-02 | Disposition: A | Discharge status: Home / Self Care | Payer: PPO | Source: Ambulatory Visit | Attending: Geriatric Medicine | Admitting: Geriatric Medicine

## 2019-04-02 DIAGNOSIS — Z1231 Encounter for screening mammogram for malignant neoplasm of breast: Secondary | ICD-10-CM

## 2019-04-22 ENCOUNTER — Other Ambulatory Visit: Payer: PPO

## 2019-05-01 ENCOUNTER — Other Ambulatory Visit: Payer: Self-pay | Admitting: *Deleted

## 2019-05-01 MED ORDER — BUDESONIDE-FORMOTEROL FUMARATE 160-4.5 MCG/ACT IN AERO: INHALATION_SPRAY | RESPIRATORY_TRACT | 1 refills | Status: DC

## 2019-05-02 ENCOUNTER — Other Ambulatory Visit: Payer: Self-pay | Admitting: Allergy and Immunology

## 2019-05-04 DIAGNOSIS — Z23 Encounter for immunization: Secondary | ICD-10-CM | POA: Diagnosis not present

## 2019-05-04 DIAGNOSIS — H8111 Benign paroxysmal vertigo, right ear: Secondary | ICD-10-CM | POA: Diagnosis not present

## 2019-05-06 DIAGNOSIS — H401213 Low-tension glaucoma, right eye, severe stage: Secondary | ICD-10-CM | POA: Diagnosis not present

## 2019-05-26 ENCOUNTER — Other Ambulatory Visit: Payer: PPO

## 2019-05-28 ENCOUNTER — Other Ambulatory Visit: Payer: PPO | Admitting: *Deleted

## 2019-05-28 ENCOUNTER — Other Ambulatory Visit: Payer: Self-pay

## 2019-05-28 DIAGNOSIS — Z79899 Other long term (current) drug therapy: Secondary | ICD-10-CM | POA: Diagnosis not present

## 2019-05-28 DIAGNOSIS — R931 Abnormal findings on diagnostic imaging of heart and coronary circulation: Secondary | ICD-10-CM | POA: Diagnosis not present

## 2019-05-28 LAB — ALT: ALT: 20 IU/L (ref 0–32)

## 2019-05-28 LAB — LIPID PANEL
Chol/HDL Ratio: 1.7 ratio (ref 0.0–4.4)
Cholesterol, Total: 129 mg/dL (ref 100–199)
HDL: 76 mg/dL (ref >39)
LDL Chol Calc (NIH): 39 mg/dL (ref 0–99)
Triglycerides: 71 mg/dL (ref 0–149)
VLDL Cholesterol Cal: 14 mg/dL (ref 5–40)

## 2019-06-12 ENCOUNTER — Encounter: Payer: Self-pay | Admitting: Neurology

## 2019-06-19 ENCOUNTER — Other Ambulatory Visit: Payer: Self-pay | Admitting: Cardiology

## 2019-07-20 ENCOUNTER — Other Ambulatory Visit: Payer: Self-pay | Admitting: Allergy and Immunology

## 2019-07-24 ENCOUNTER — Telehealth: Payer: Self-pay | Admitting: Allergy and Immunology

## 2019-07-24 ENCOUNTER — Other Ambulatory Visit: Payer: Self-pay | Admitting: Allergy and Immunology

## 2019-07-24 NOTE — Telephone Encounter (Signed)
Patient called to return [name]'s message. Victoria Trevino was unavailable and patient was informed that he would give her a call back.

## 2019-07-24 NOTE — Telephone Encounter (Signed)
Patient called stating that she did a televisit with Dr. Neldon Mc in May due to [place]. Patient would like to know if Dr. Neldon Mc wants to do another televisit or wants her to come into the office for her follow up. Patient was okay with doing either, but would like Kozlow's recommendation.   Patient stated that she has not had any issues and that she is doing well.  Please advise.

## 2019-07-24 NOTE — Telephone Encounter (Signed)
Left message to call back  

## 2019-07-24 NOTE — Telephone Encounter (Signed)
Patient is only wanting to see Dr. Neldon Mc and is scheduled.

## 2019-08-06 IMAGING — CT CT HEAR MORPH WITH CTA COR WITH SCORE WITH CA WITH CONTRAST AND
4 of 7 series · 8 of 20 positions shown, 9 images · IV contrast (APPLIED)
Comparison: 07/16/2003
COMPARISON: 07/16/2003

Addendum:
EXAM:
OVER-READ INTERPRETATION  CT CHEST

The following report is an over-read performed by radiologist Dr.
Kiri Jim [REDACTED] on 02/09/2019. This over-read
does not include interpretation of cardiac or coronary anatomy or
pathology. The coronary CTA interpretation by the cardiologist is
attached.
CLINICAL DATA: 78-year-old female with atypical chest pain.
Cardiac/Coronary  CT
TECHNIQUE: The patient was scanned on a Phillips Force scanner.

[Series 6: best diast 76 % · axial · 0.31mm/px · z∈[+1183,+1224]mm · 2 of 311 slices shown]
[im 104/311  vessel]
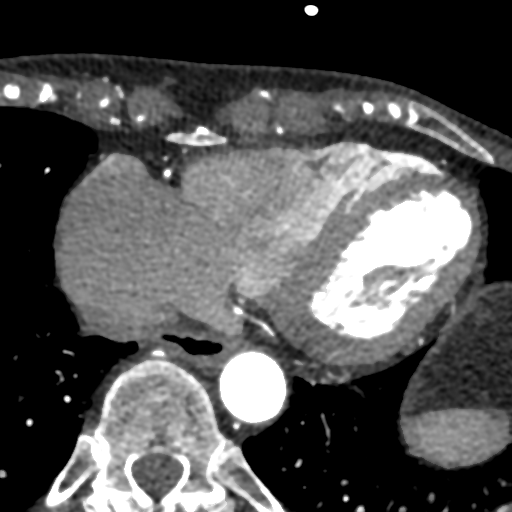
[im 207/311  vessel]
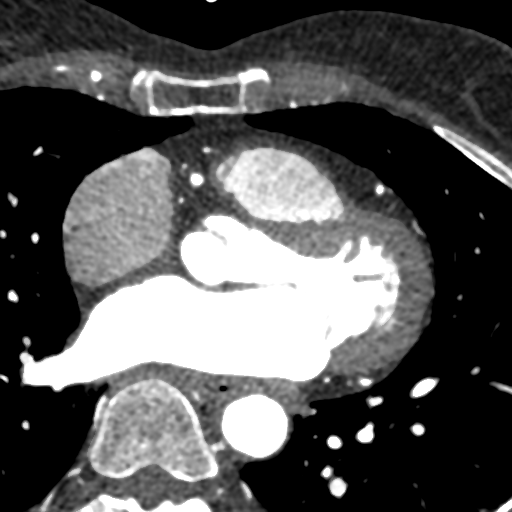

[Series 7: best syst · axial · 0.31mm/px · z∈[+1183,+1224]mm · 2 of 311 slices shown, 3 images]
[im 104/311  vessel]
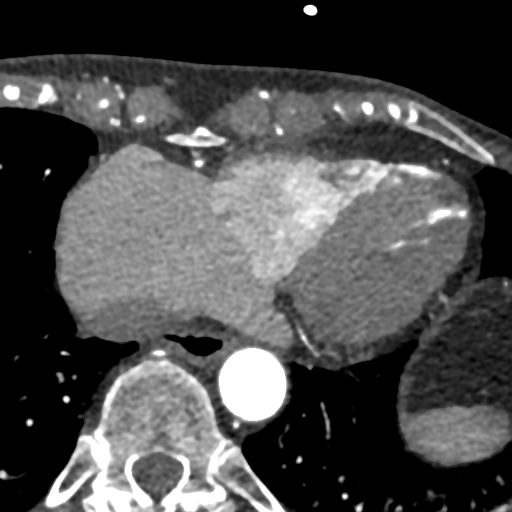
[im 104/311  lung]
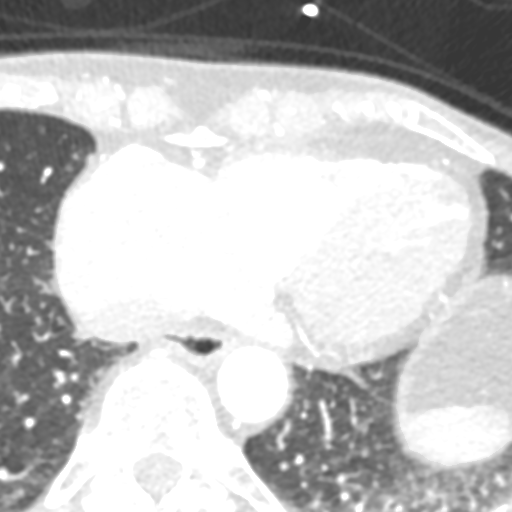
[im 207/311  vessel]
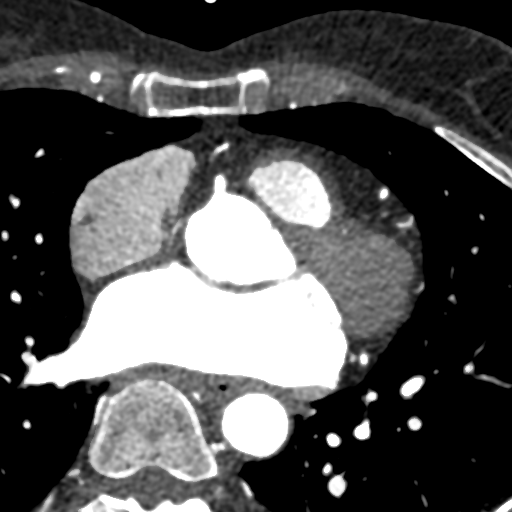

[Series 8: ts diast sharp 76 % · axial · 0.31mm/px · z∈[+1183,+1224]mm · 2 of 311 slices shown]
[im 104/311  lung]
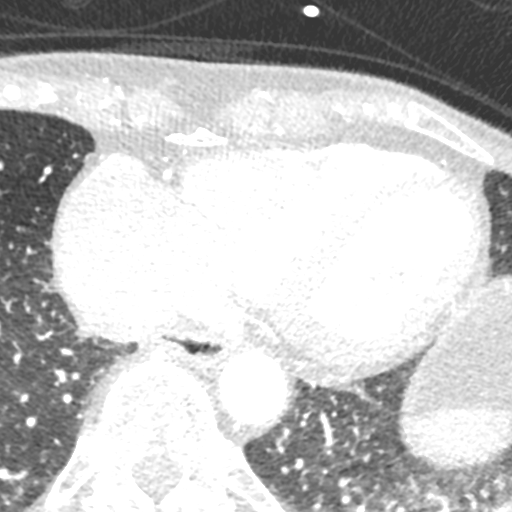
[im 207/311  lung]
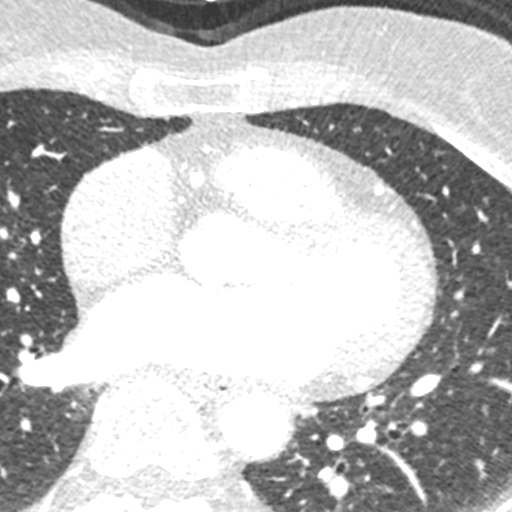

[Series 9: ts syst sharp 41 % · axial · 0.31mm/px · z∈[+1183,+1224]mm · 2 of 311 slices shown]
[im 104/311  lung]
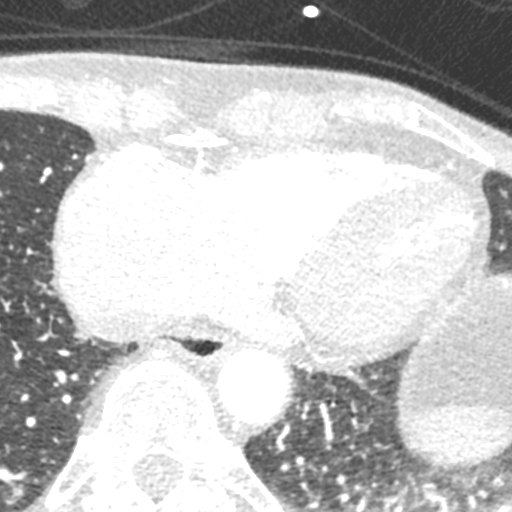
[im 207/311  lung]
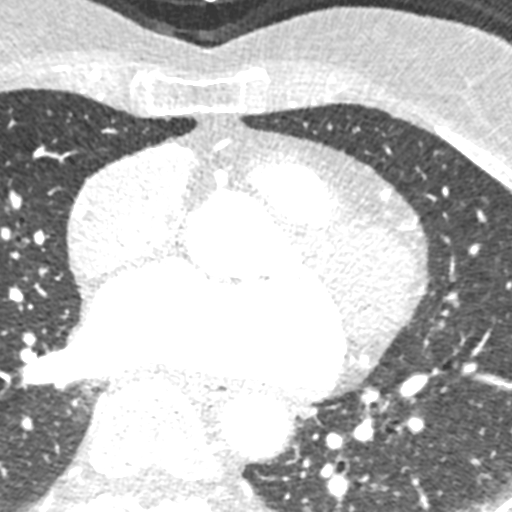

[8 of 20 positions shown; findings below may reference images not displayed]

FINDINGS: Vascular: Heart is normal size.  Aorta is normal caliber.

Mediastinum/Nodes: No adenopathy in the lower mediastinum or hila.

Lungs/Pleura: Minimal dependent atelectasis.  No effusions.

Upper Abdomen: Imaging into the upper abdomen shows no acute
findings.

Musculoskeletal: Chest wall soft tissues are unremarkable. No acute
bony abnormality.
IMPRESSION: No acute or significant extracardiac abnormality.
FINDINGS: A 120 kV prospective scan was triggered in the descending thoracic
aorta at 111 HU's. Axial non-contrast 3 mm slices were carried out
through the heart. The data set was analyzed on a dedicated work
station and scored using the Agatson method. Gantry rotation speed
was 250 msecs and collimation was .6 mm. 100 mg of PO metoprolol and
0.8 mg of sl NTG was given. The 3D data set was reconstructed in 5%
intervals of the 67-82 % of the R-R cycle. Diastolic phases were
analyzed on a dedicated work station using MPR, MIP and VRT modes.
The patient received 80 cc of contrast.

Aorta: Normal size. Trivial atherosclerotic plaque. No dissection.

Aortic Valve:  Trileaflet.  No calcifications.

Coronary Arteries:  Normal coronary origin.  Right dominance.

RCA is a large dominant artery that gives rise to PDA and PLVB.
There is minimal calcified plaque in the proximal RCA with stenosis
0-25%. Mid and distal RCA has a mild non-calcified plaque with
stenosis 25-50%.

PDA is a very small.

PLA has no obvious plaque.

Left main is a large and long artery that gives rise to LAD and LCX
arteries. Left main has no plaque.

LAD is a large vessel that gives rise to two small diagonal
arteries, wraps around the apex where is supplies part of PDA
territory. There is minimal diffuse plaque with maximum stenosis
0-25%.

LCX is a non-dominant artery that gives rise to one large OM1
branch. There is minimal calcified plaque with stenosis 0-25%.

Other findings:

Normal pulmonary vein drainage into the left atrium.

Normal let atrial appendage without a thrombus.

Normal size of the pulmonary artery.
IMPRESSION: 1. Coronary calcium score of 74. This was 48 percentile for age and
sex matched control.

2. Normal coronary origin with right dominance.

3. Mild non-obstructive CAD. Risk factor modification is
recommended.

*** End of Addendum ***
EXAM:
OVER-READ INTERPRETATION  CT CHEST

The following report is an over-read performed by radiologist Dr.
Kiri Jim [REDACTED] on 02/09/2019. This over-read
does not include interpretation of cardiac or coronary anatomy or
pathology. The coronary CTA interpretation by the cardiologist is
attached.
FINDINGS: Vascular: Heart is normal size.  Aorta is normal caliber.

Mediastinum/Nodes: No adenopathy in the lower mediastinum or hila.

Lungs/Pleura: Minimal dependent atelectasis.  No effusions.

Upper Abdomen: Imaging into the upper abdomen shows no acute
findings.

Musculoskeletal: Chest wall soft tissues are unremarkable. No acute
bony abnormality.
IMPRESSION: No acute or significant extracardiac abnormality.

## 2019-08-11 ENCOUNTER — Other Ambulatory Visit: Payer: Self-pay | Admitting: Allergy and Immunology

## 2019-09-10 DIAGNOSIS — Z79899 Other long term (current) drug therapy: Secondary | ICD-10-CM | POA: Diagnosis not present

## 2019-10-06 ENCOUNTER — Ambulatory Visit: Payer: PPO | Admitting: Allergy and Immunology

## 2019-10-06 ENCOUNTER — Encounter: Payer: Self-pay | Admitting: Allergy and Immunology

## 2019-10-06 ENCOUNTER — Other Ambulatory Visit: Payer: Self-pay

## 2019-10-06 VITALS — BP 108/72 | HR 72 | Temp 97.6°F | Resp 16 | Ht 68.0 in | Wt 122.0 lb

## 2019-10-06 DIAGNOSIS — J4540 Moderate persistent asthma, uncomplicated: Secondary | ICD-10-CM

## 2019-10-06 DIAGNOSIS — J3089 Other allergic rhinitis: Secondary | ICD-10-CM

## 2019-10-06 DIAGNOSIS — B370 Candidal stomatitis: Secondary | ICD-10-CM | POA: Diagnosis not present

## 2019-10-06 DIAGNOSIS — K219 Gastro-esophageal reflux disease without esophagitis: Secondary | ICD-10-CM | POA: Diagnosis not present

## 2019-10-06 NOTE — Patient Instructions (Addendum)
   1. Continue Symbicort 160 - 2 inhalations twice a day    2. Continue omeprazole 40 mg - 80 mg in AM + 40 mg in PM + famotidine 20 mg in evening  3. Continue nasal fluticasone one spray each nostril one time per day  4. Continue a combination of the following to address fungal disease:    A. Diflucan 100 one tablet every 2 weeks.  B. nystatin oral solution 1 teaspoon swish and swallow every morning  5. May use OTC Mucinex DM, Zyrtec, azelastine, and ProAir HFA as needed  6. Add Asmanex 200 HFA - 2 inhalations 2 times per day during flare up.   7. Obtain Covid vaccine  8. Return to clinic in 6 months or earlier if problem

## 2019-10-06 NOTE — Progress Notes (Signed)
[place] - [place] - [place] - [place] - [place]   Follow-up Note  Referring Provider: Merlene Laughter, MD Primary Provider: Merlene Laughter, MD Date of Office Visit: 10/06/2019  Subjective:   Victoria Trevino (DOB: 09/14/1940) is a 79 y.o. female who [facility] in re-evaluation of the following:  HPI: Forever returns to this clinic in reevaluation of asthma and allergic rhinitis and LPR and a history of recurrent thrush.  Her last interaction with this clinic was via a E - med visit on 06 Jan 2019.  She has done very well with her asthma and has not required a systemic steroid to treat an exacerbation and rarely uses a short acting bronchodilator and has undergoing exercise with a personal trainer 2 times per week.  She is short of breath when she does exercise.  She continues to use Symbicort twice a day.  Regarding her exertional dyspnea, she did undergo evaluation with cardiology and had an echocardiogram and coronary artery CT scan that identified some degree of valvular abnormality but not significant enough to produce a flow problem.  Her nose has really been doing quite well on her current therapy which includes a nasal steroid every day.  As long as she continues using Diflucan every 2 weeks and nystatin every day she has no problems with thrush.  Her reflux is under very good control at this point time while using 80 mg of omeprazole in the morning and 40 mg in the evening with famotidine 20 mg in evening  She did obtain the flu vaccine.  Allergies as of 10/06/2019   No Known Allergies     Medication List      aspirin EC 81 MG tablet Take 1 tablet (81 mg total) by mouth daily.   azelastine 0.1 % nasal spray Commonly known as: ASTELIN Place 2 sprays into both nostrils 2 (two) times daily.   budesonide-formoterol 160-4.5 MCG/ACT inhaler Commonly known as: Symbicort Inhale 2 puffs by mouth twice a day to prevent cough or  wheezing.  Rinse, gargle, and spit after use.   cetirizine 10 MG tablet Commonly known as: ZYRTEC Take 1 tablet (10 mg total) by mouth daily.   famotidine 40 MG tablet Commonly known as: PEPCID Take 1 tablet by mouth once daily in the evening   fluconazole 100 MG tablet Commonly known as: DIFLUCAN TAKE 1 TABLET BY MOUTH EVERY 14 DAYS   fluticasone 50 MCG/ACT nasal spray Commonly known as: FLONASE Use one spray in each nostril once daily.   Mometasone Furoate 200 MCG/ACT Aero Commonly known as: Asmanex HFA Inhale 2 puffs into the lungs 2 (two) times daily.   nystatin 100000 UNIT/ML suspension Commonly known as: MYCOSTATIN USE 1 TEASPOON TO SWISH AND SWALLOW EVERY MORNING   omeprazole 40 MG capsule Commonly known as: PRILOSEC Take 40 mg by mouth 2 (two) times a day.   rosuvastatin 5 MG tablet Commonly known as: CRESTOR TAKE 1 TABLET BY MOUTH DAILY   Simply Saline 0.9 % Aers Generic drug: Saline Place 2 sprays into the nose 2 (two) times a day.   TYLENOL PO Take by mouth as needed.   Vitamin D-3 25 MCG (1000 UT) Caps Take 1,000 Units by mouth daily.       Past Medical History:  Diagnosis Date  . Allergic rhinoconjunctivitis 08/02/2015  . Asthma   . Asthma, well controlled 10/31/2017  . GERD (gastroesophageal reflux disease)   . Hereditary and idiopathic peripheral  neuropathy 02/16/2016  . Left wrist injury 01/18/2017  . LPRD (laryngopharyngeal reflux disease) 08/02/2015  . Moderate persistent asthma 08/02/2015  . Thrush, oral 10/31/2017    Past Surgical History:  Procedure Laterality Date  . Cervical decompression and fusion  1997  . ECTOPIC PREGNANCY SURGERY    . KNEE SURGERY    . nissan infundibulm    . THROAT SURGERY      Review of systems negative except as noted in HPI / PMHx or noted below:  Review of Systems  Constitutional: Negative.   HENT: Negative.   Eyes: Negative.   Respiratory: Negative.   Cardiovascular: Negative.     Gastrointestinal: Negative.   Genitourinary: Negative.   Musculoskeletal: Negative.   Skin: Negative.   Neurological: Negative.   Endo/Heme/Allergies: Negative.   Psychiatric/Behavioral: Negative.      Objective:   Vitals:   10/06/19 1556  BP: 108/72  Pulse: 72  Resp: 16  Temp: 97.6 F (36.4 C)  SpO2: 97%   Height: 5\' 8"  (172.7 cm)  Weight: 122 lb (55.3 kg)   Physical Exam Constitutional:      Appearance: She is not diaphoretic.  HENT:     Head: Normocephalic.     Right Ear: Tympanic membrane, ear canal and external ear normal.     Left Ear: Tympanic membrane, ear canal and external ear normal.     Nose: Nose normal. No mucosal edema or rhinorrhea.     Mouth/Throat:     Pharynx: Uvula midline. No oropharyngeal exudate.  Eyes:     Conjunctiva/sclera: Conjunctivae normal.  Neck:     Thyroid: No thyromegaly.     Trachea: Trachea normal. No tracheal tenderness or tracheal deviation.  Cardiovascular:     Rate and Rhythm: Normal rate and regular rhythm.     Heart sounds: Normal heart sounds, S1 normal and S2 normal. No murmur.  Pulmonary:     Effort: No respiratory distress.     Breath sounds: Normal breath sounds. No stridor. No wheezing or rales.  Lymphadenopathy:     Head:     Right side of head: No tonsillar adenopathy.     Left side of head: No tonsillar adenopathy.     Cervical: No cervical adenopathy.  Skin:    Findings: No erythema or rash.     Nails: There is no clubbing.  Neurological:     Mental Status: She is alert.     Diagnostics:    Spirometry was performed and demonstrated an FEV1 of 1.69 at 70 % of predicted.  The patient had an Asthma Control Test with the following results:  .    Results of an echocardiogram obtained 16 February 2019 identified the following:  1. The left ventricle has normal systolic function, with an ejection  fraction of 55-60%. The cavity size was normal. Left ventricular diastolic  parameters were normal.  2.  Normal GLS -19.6.  3. The right ventricle has normal systolic function. The cavity was  normal. There is no increase in right ventricular wall thickness.  4. The mitral valve is degenerative. Moderate thickening of the mitral  valve leaflet. Mild calcification of the mitral valve leaflet.  5. The aortic valve is tricuspid. Moderate thickening of the aortic  valve. Sclerosis without any evidence of stenosis of the aortic valve.  Aortic valve regurgitation is mild by color flow Doppler.   Results of a CT scan coronary artery morphology study obtained on 19 February 2019 identified the following:  1. Coronary calcium  score of 74. This was 48 percentile for age and sex matched control.  2. Normal coronary origin with right dominance.  3. Mild non-obstructive CAD. Risk factor modification is recommended.  Assessment and Plan:   1. Asthma, moderate persistent, well-controlled   2. Other allergic rhinitis   3. LPRD (laryngopharyngeal reflux disease)   4. Thrush, oral     1. Continue Symbicort 160 - 2 inhalations twice a day    2. Continue omeprazole 40 mg - 80 mg in AM + 40 mg in PM + famotidine 20 mg in evening  3. Continue nasal fluticasone one spray each nostril one time per day  4. Continue a combination of the following to address fungal disease:    A. Diflucan 100 one tablet every 2 weeks.  B. nystatin oral solution 1 teaspoon swish and swallow every morning  5. May use OTC Mucinex DM, Zyrtec, azelastine, and ProAir HFA as needed  6. Add Asmanex 200 HFA - 2 inhalations 2 times per day during flare up.   7. Obtain Covid vaccine  8. Return to clinic in 6 months or earlier if problem  Olive appears to be doing quite well on her current therapy and I would like for her to remain on a collection of anti-inflammatory agents for her airway and therapy directed against reflux as noted above and continue to address her chronic thrush issue with a combination of treatment as noted  above.  Assuming she continues to do well I will see her back in this clinic in 6 months or earlier if there is a problem.  Laurette Schimke, MD Allergy / Immunology [facility]

## 2019-10-07 ENCOUNTER — Encounter: Payer: Self-pay | Admitting: Allergy and Immunology

## 2019-10-27 ENCOUNTER — Other Ambulatory Visit: Payer: Self-pay | Admitting: Allergy and Immunology

## 2019-11-03 DIAGNOSIS — H524 Presbyopia: Secondary | ICD-10-CM | POA: Diagnosis not present

## 2019-11-03 DIAGNOSIS — H43813 Vitreous degeneration, bilateral: Secondary | ICD-10-CM | POA: Diagnosis not present

## 2019-11-03 DIAGNOSIS — H353131 Nonexudative age-related macular degeneration, bilateral, early dry stage: Secondary | ICD-10-CM | POA: Diagnosis not present

## 2019-11-03 DIAGNOSIS — H401213 Low-tension glaucoma, right eye, severe stage: Secondary | ICD-10-CM | POA: Diagnosis not present

## 2019-11-05 ENCOUNTER — Ambulatory Visit: Payer: PPO | Attending: Internal Medicine

## 2019-11-05 DIAGNOSIS — Z23 Encounter for immunization: Secondary | ICD-10-CM | POA: Insufficient documentation

## 2019-11-05 NOTE — Progress Notes (Signed)
   Covid-19 Vaccination Clinic  Name:  ROSHAN SALAMON    MRN: [id number] DOB: 01-Jan-1941  11/05/2019  Ms. Klecker was observed post Covid-19 immunization for 15 minutes without incident. She was provided with Vaccine Information Sheet and instruction to access the V-Safe system.   Ms. Winnie was instructed to call 911 with any severe reactions post vaccine: Marland Kitchen Difficulty breathing  . Swelling of face and throat  . A fast heartbeat  . A bad rash all over body  . Dizziness and weakness   Immunizations Administered    Name Date Dose VIS Date Route   Pfizer COVID-19 Vaccine 11/05/2019  9:15 AM 0.3 mL 08/14/2019 Intramuscular   Manufacturer: [employer], [employer]   Lot: [id number]   NDC: [address]-0

## 2019-11-25 ENCOUNTER — Encounter: Payer: Self-pay | Admitting: Neurology

## 2019-11-26 ENCOUNTER — Other Ambulatory Visit: Payer: Self-pay

## 2019-11-26 ENCOUNTER — Telehealth (INDEPENDENT_AMBULATORY_CARE_PROVIDER_SITE_OTHER): Payer: PPO | Admitting: Neurology

## 2019-11-26 VITALS — Ht 67.75 in | Wt 116.0 lb

## 2019-11-26 DIAGNOSIS — G609 Hereditary and idiopathic neuropathy, unspecified: Secondary | ICD-10-CM

## 2019-11-26 NOTE — Progress Notes (Signed)
   Virtual Visit via Video Note The purpose of this virtual visit is to provide medical care while limiting exposure to the novel coronavirus.    Consent was obtained for video visit:  Yes.   Answered questions that patient had about telehealth interaction:  Yes.   I discussed the limitations, risks, security and privacy concerns of performing an evaluation and management service by telemedicine. I also discussed with the patient that there may be a patient responsible charge related to this service. The patient expressed understanding and agreed to proceed.  Pt location: Home Physician Location: office Name of referring provider:  Merlene Laughter, MD I connected with Victoria Trevino at patients initiation/request on 11/26/2019 at  9:50 AM EDT by video enabled telemedicine application and verified that I am speaking with the correct person using two identifiers. Pt MRN:  [id number] Pt DOB:  [birth year] Video Participants:  Victoria Trevino   History of Present Illness: This is a 79 y.o. female returning for follow-up of neuropathy.  She has noticed that numbness is involving higher up in her legs, towards her knees, and into her hands.  She does not have significant pain associated with her neuropathy.  She uses lidocaine as needed when burning is severe, usually about once per month.  She denies any weakness of the hands or legs.  She has some imbalance, especially on uneven ground. She tripped in her driveway and injured her right ring finger. She has resumed going to a [employer] account manager in October 2020.  She remains highly independent and continues to care for her sister-in-law who lives with her.  Observations/Objective:   Vitals:   11/25/19 1623  Weight: 116 lb (52.6 kg)  Height: 5' 7.75" (1.721 m)   Patient is awake, alert, and appears comfortable.  Oriented x 4.   Extraocular muscles are intact. No ptosis.  Face is symmetric.  Speech is not dysarthric Antigravity in all extremities.  She is  able to stand on each leg unassisted.  Finger tapping is intact.  No pronator drift. Gait appears normal.   Assessment and Plan:  Idiopathic peripheral neuropathy affecting stocking glove distribution, mild progression.  -Symptoms are predominantly numbness with occasional pain in adequately controlled with lidocaine ointment as needed  -She stays physically active and was encouraged to continue to do so  -Patient educated on daily foot inspection, fall prevention, and safety precautions around the home.  Follow Up Instructions:   I discussed the assessment and treatment plan with the patient. The patient was provided an opportunity to ask questions and all were answered. The patient agreed with the plan and demonstrated an understanding of the instructions.   The patient was advised to call back or seek an in-person evaluation if the symptoms worsen or if the condition fails to improve as anticipated.  Follow-up in 18 months  Glendale Chard, DO

## 2019-11-30 ENCOUNTER — Ambulatory Visit: Payer: PPO | Attending: Internal Medicine

## 2019-11-30 DIAGNOSIS — Z23 Encounter for immunization: Secondary | ICD-10-CM

## 2019-11-30 NOTE — Progress Notes (Signed)
   Covid-19 Vaccination Clinic  Name:  Victoria Trevino    MRN: [id number] DOB: February 20, 1941  11/30/2019  [name] was observed post Covid-19 immunization for 15 minutes without incident. She was provided with Vaccine Information Sheet and instruction to access the V-Safe system.   [name] was instructed to call 911 with any severe reactions post vaccine: Marland Kitchen Difficulty breathing  . Swelling of face and throat  . A fast heartbeat  . A bad rash all over body  . Dizziness and weakness   Immunizations Administered    Name Date Dose VIS Date Route   Pfizer COVID-19 Vaccine 11/30/2019  9:05 AM 0.3 mL 08/14/2019 Intramuscular   Manufacturer: [employer], [employer]   Lot: [id number]   NDC: [address]-3

## 2019-12-15 ENCOUNTER — Other Ambulatory Visit: Payer: Self-pay | Admitting: Cardiology

## 2019-12-24 ENCOUNTER — Other Ambulatory Visit: Payer: Self-pay | Admitting: *Deleted

## 2019-12-24 MED ORDER — FLUTICASONE PROPIONATE 50 MCG/ACT NA SUSP: NASAL | 1 refills | Status: DC

## 2020-01-05 ENCOUNTER — Other Ambulatory Visit: Payer: Self-pay | Admitting: Allergy and Immunology

## 2020-01-06 ENCOUNTER — Ambulatory Visit: Payer: PPO | Admitting: Neurology

## 2020-01-07 ENCOUNTER — Other Ambulatory Visit: Payer: Self-pay | Admitting: Cardiology

## 2020-01-07 ENCOUNTER — Ambulatory Visit: Payer: PPO | Admitting: Neurology

## 2020-01-07 ENCOUNTER — Other Ambulatory Visit: Payer: Self-pay | Admitting: *Deleted

## 2020-01-07 MED ORDER — FLUCONAZOLE 100 MG PO TABS: ORAL_TABLET | ORAL | 1 refills | Status: DC

## 2020-01-07 MED ORDER — AZELASTINE HCL 0.1 % NA SOLN: 2.0000 | Freq: Two times a day (BID) | NASAL | 1 refills | Status: DC

## 2020-01-07 MED ORDER — ASMANEX HFA 200 MCG/ACT IN AERO: 2.0000 | INHALATION_SPRAY | Freq: Two times a day (BID) | RESPIRATORY_TRACT | 1 refills | Status: DC

## 2020-01-07 MED ORDER — ROSUVASTATIN CALCIUM 5 MG PO TABS: 5.0000 mg | ORAL_TABLET | Freq: Every day | ORAL | 0 refills | Status: DC

## 2020-01-07 MED ORDER — BUDESONIDE-FORMOTEROL FUMARATE 160-4.5 MCG/ACT IN AERO: INHALATION_SPRAY | RESPIRATORY_TRACT | 1 refills | Status: DC

## 2020-01-07 MED ORDER — FLUTICASONE PROPIONATE 50 MCG/ACT NA SUSP: NASAL | 1 refills | Status: DC

## 2020-01-07 MED ORDER — ALBUTEROL SULFATE HFA 108 (90 BASE) MCG/ACT IN AERS: 2.0000 | INHALATION_SPRAY | Freq: Four times a day (QID) | RESPIRATORY_TRACT | 1 refills | Status: DC | PRN

## 2020-01-07 MED ORDER — NYSTATIN 100000 UNIT/ML MT SUSP: OROMUCOSAL | 1 refills | Status: DC

## 2020-01-07 NOTE — Telephone Encounter (Signed)
*  STAT* If patient is at the pharmacy, call can be transferred to refill team.   1. Which medications need to be refilled? (please list name of each medication and dose if known) rosuvastatin (CRESTOR) 5 MG tablet  2. Which pharmacy/location (including street and city if local pharmacy) is medication to be sent to? [facility] - [place], [place] - [address]  3. Do they need a 30 day or 90 day supply? 90 day    

## 2020-01-08 DIAGNOSIS — J4530 Mild persistent asthma, uncomplicated: Secondary | ICD-10-CM | POA: Diagnosis not present

## 2020-01-08 DIAGNOSIS — E7800 Pure hypercholesterolemia, unspecified: Secondary | ICD-10-CM | POA: Diagnosis not present

## 2020-01-08 DIAGNOSIS — M8580 Other specified disorders of bone density and structure, unspecified site: Secondary | ICD-10-CM | POA: Diagnosis not present

## 2020-02-04 DIAGNOSIS — K9089 Other intestinal malabsorption: Secondary | ICD-10-CM | POA: Diagnosis not present

## 2020-02-04 DIAGNOSIS — E7800 Pure hypercholesterolemia, unspecified: Secondary | ICD-10-CM | POA: Diagnosis not present

## 2020-02-04 DIAGNOSIS — D692 Other nonthrombocytopenic purpura: Secondary | ICD-10-CM | POA: Diagnosis not present

## 2020-02-04 DIAGNOSIS — J4530 Mild persistent asthma, uncomplicated: Secondary | ICD-10-CM | POA: Diagnosis not present

## 2020-02-04 DIAGNOSIS — G629 Polyneuropathy, unspecified: Secondary | ICD-10-CM | POA: Diagnosis not present

## 2020-02-04 DIAGNOSIS — R413 Other amnesia: Secondary | ICD-10-CM | POA: Diagnosis not present

## 2020-02-04 DIAGNOSIS — Z0000 Encounter for general adult medical examination without abnormal findings: Secondary | ICD-10-CM | POA: Diagnosis not present

## 2020-02-04 DIAGNOSIS — M8580 Other specified disorders of bone density and structure, unspecified site: Secondary | ICD-10-CM | POA: Diagnosis not present

## 2020-02-04 DIAGNOSIS — Z79899 Other long term (current) drug therapy: Secondary | ICD-10-CM | POA: Diagnosis not present

## 2020-02-04 DIAGNOSIS — F439 Reaction to severe stress, unspecified: Secondary | ICD-10-CM | POA: Diagnosis not present

## 2020-02-04 DIAGNOSIS — K219 Gastro-esophageal reflux disease without esophagitis: Secondary | ICD-10-CM | POA: Diagnosis not present

## 2020-02-04 DIAGNOSIS — Z1389 Encounter for screening for other disorder: Secondary | ICD-10-CM | POA: Diagnosis not present

## 2020-02-18 ENCOUNTER — Other Ambulatory Visit: Payer: Self-pay | Admitting: Geriatric Medicine

## 2020-02-18 DIAGNOSIS — Z1231 Encounter for screening mammogram for malignant neoplasm of breast: Secondary | ICD-10-CM

## 2020-03-28 DIAGNOSIS — M8580 Other specified disorders of bone density and structure, unspecified site: Secondary | ICD-10-CM | POA: Diagnosis not present

## 2020-03-28 DIAGNOSIS — E7800 Pure hypercholesterolemia, unspecified: Secondary | ICD-10-CM | POA: Diagnosis not present

## 2020-03-28 DIAGNOSIS — J4530 Mild persistent asthma, uncomplicated: Secondary | ICD-10-CM | POA: Diagnosis not present

## 2020-04-05 ENCOUNTER — Ambulatory Visit: Payer: PPO | Admitting: Allergy and Immunology

## 2020-04-05 ENCOUNTER — Encounter: Payer: Self-pay | Admitting: Allergy and Immunology

## 2020-04-05 ENCOUNTER — Other Ambulatory Visit: Payer: Self-pay

## 2020-04-05 VITALS — BP 118/60 | HR 63 | Temp 98.2°F | Resp 16

## 2020-04-05 DIAGNOSIS — K219 Gastro-esophageal reflux disease without esophagitis: Secondary | ICD-10-CM

## 2020-04-05 DIAGNOSIS — J3089 Other allergic rhinitis: Secondary | ICD-10-CM | POA: Diagnosis not present

## 2020-04-05 DIAGNOSIS — R5383 Other fatigue: Secondary | ICD-10-CM

## 2020-04-05 DIAGNOSIS — J4540 Moderate persistent asthma, uncomplicated: Secondary | ICD-10-CM | POA: Diagnosis not present

## 2020-04-05 DIAGNOSIS — B370 Candidal stomatitis: Secondary | ICD-10-CM | POA: Diagnosis not present

## 2020-04-05 NOTE — Progress Notes (Signed)
[place] - [place] - [place] - [place] - [place]   Follow-up Note  Referring Provider: Merlene Laughter, MD Primary Provider: Merlene Laughter, MD Date of Office Visit: 04/05/2020  Subjective:   Victoria Trevino (DOB: 14-Jun-1941) is a 79 y.o. female who [facility] 04[facility]05/2020 in re-evaluation of the following:  HPI: Victoria Trevino returns to this clinic in evaluation of asthma and allergic rhinitis and LPR and history of recurrent thrush.  Her last visit to this clinic was 06 October 2019.  Overall she has done very well with her asthma.  She rarely uses a short acting bronchodilator.  She works in the garden for hours usually without any difficulty.  She does not really perform any aerobic exercise beyond working in the garden.  She continues to use her Symbicort twice a day.  She has not required a systemic steroid to treat a exacerbation.  She has had very little issues with her nose and has not required an antibiotic to treat an episode of sinusitis while using a nasal steroid.  Her reflux is under very good control at this point.  She did attempt to taper down her omeprazole at nighttime to 40 mg but unfortunately she found that she needed to use 80 mg both in the morning and at night and also continue to use famotidine in the evening.  Her recurrent thrush is under excellent control while using Diflucan every 2 weeks and nystatin oral solution every morning.  She complains of having some fatigue and not as much energy.  She does use Zyrtec and she uses nasal azelastine.  She has obtained two Pfizer Covid vaccinations.  Allergies as of 04/05/2020      Reactions   Valium [diazepam]    hypersensitivity      Medication List      albuterol 108 (90 Base) MCG/ACT inhaler Commonly known as: Proventil HFA Inhale 2 puffs into the lungs every 6 (six) hours as needed for wheezing or shortness of breath.   Asmanex HFA 200 MCG/ACT Aero Generic drug:  Mometasone Furoate Inhale 2 puffs into the lungs 2 (two) times daily.   aspirin EC 81 MG tablet Take 1 tablet (81 mg total) by mouth daily.   azelastine 0.1 % nasal spray Commonly known as: ASTELIN Place 2 sprays into both nostrils 2 (two) times daily.   budesonide-formoterol 160-4.5 MCG/ACT inhaler Commonly known as: Symbicort Inhale 2 puffs by mouth twice a day to prevent cough or wheezing.  Rinse, gargle, and spit after use.   cetirizine 10 MG tablet Commonly known as: ZYRTEC Take 1 tablet (10 mg total) by mouth daily.   famotidine 40 MG tablet Commonly known as: PEPCID Take 1 tablet by mouth once daily in the evening   fluconazole 100 MG tablet Commonly known as: DIFLUCAN TAKE 1 TABLET BY MOUTH EVERY 14 DAYS   fluticasone 50 MCG/ACT nasal spray Commonly known as: FLONASE Use one spray in each nostril once daily.   nystatin 100000 UNIT/ML suspension Commonly known as: MYCOSTATIN 1 teaspoon swish and swallow every morning.   omeprazole 40 MG capsule Commonly known as: PRILOSEC Take 40 mg by mouth 2 (two) times a day.   rosuvastatin 5 MG tablet Commonly known as: CRESTOR Take 1 tablet (5 mg total) by mouth daily.   Simply Saline 0.9 % Aers Generic drug: Saline Place 2 sprays into the nose 2 (two) times a day.   TYLENOL PO Take by mouth as needed.  Vitamin D-3 25 MCG (1000 UT) Caps Take 1,000 Units by mouth daily.       Past Medical History:  Diagnosis Date  . Allergic rhinoconjunctivitis 08/02/2015  . Asthma   . Asthma, well controlled 10/31/2017  . GERD (gastroesophageal reflux disease)   . Hereditary and idiopathic peripheral neuropathy 02/16/2016  . Left wrist injury 01/18/2017  . LPRD (laryngopharyngeal reflux disease) 08/02/2015  . Moderate persistent asthma 08/02/2015  . Thrush, oral 10/31/2017    Past Surgical History:  Procedure Laterality Date  . Cervical decompression and fusion  1997  . ECTOPIC PREGNANCY SURGERY    . KNEE SURGERY    .  nissan infundibulm    . THROAT SURGERY      Review of systems negative except as noted in HPI / PMHx or noted below:  Review of Systems  Constitutional: Negative.   HENT: Negative.   Eyes: Negative.   Respiratory: Negative.   Cardiovascular: Negative.   Gastrointestinal: Negative.   Genitourinary: Negative.   Musculoskeletal: Negative.   Skin: Negative.   Neurological: Negative.   Endo/Heme/Allergies: Negative.   Psychiatric/Behavioral: Negative.      Objective:   Vitals:   04/05/20 1458  BP: 118/60  Pulse: 63  Resp: 16  Temp: 98.2 F (36.8 C)  SpO2: 97%          Physical Exam Constitutional:      Appearance: She is not diaphoretic.  HENT:     Head: Normocephalic.     Right Ear: Tympanic membrane, ear canal and external ear normal.     Left Ear: Tympanic membrane, ear canal and external ear normal.     Nose: Nose normal. No mucosal edema or rhinorrhea.     Mouth/Throat:     Pharynx: Uvula midline. No oropharyngeal exudate.  Eyes:     Conjunctiva/sclera: Conjunctivae normal.  Neck:     Thyroid: No thyromegaly.     Trachea: Trachea normal. No tracheal tenderness or tracheal deviation.  Cardiovascular:     Rate and Rhythm: Normal rate and regular rhythm.     Heart sounds: Normal heart sounds, S1 normal and S2 normal. No murmur heard.   Pulmonary:     Effort: No respiratory distress.     Breath sounds: Normal breath sounds. No stridor. No wheezing or rales.  Lymphadenopathy:     Head:     Right side of head: No tonsillar adenopathy.     Left side of head: No tonsillar adenopathy.     Cervical: No cervical adenopathy.  Skin:    Findings: No erythema or rash.     Nails: There is no clubbing.  Neurological:     Mental Status: She is alert.     Diagnostics:    Spirometry was performed and demonstrated an FEV1 of 2.82 at 118 % of predicted.  The patient had an Asthma Control Test with the following results: ACT Total Score: 23.    Assessment and  Plan:   1. Asthma, moderate persistent, well-controlled   2. Other allergic rhinitis   3. LPRD (laryngopharyngeal reflux disease)   4. Thrush, oral   5. Other fatigue     1. Continue Symbicort 160 - 2 inhalations twice a day    2. Continue omeprazole 40 mg - 80 mg in AM + 80 mg in PM + famotidine 20 mg in evening  3. Continue nasal fluticasone 1-2 spray each nostril 1 time per day  4. Continue a combination of the following to address fungal disease:  A. Diflucan 100 one tablet every 2 weeks.  B. nystatin oral solution 1 teaspoon swish and swallow every morning  5. May use OTC Mucinex DM, Zyrtec, azelastine, and ProAir HFA as needed  6. Can discontinue Zyrtec for 2 weeks and then azelastine for 2 weeks to see if this contributes to fatigue  7. Obtain fall flu vaccine  8. Return to clinic in 6 months or earlier if problem  Hiromi appears to be doing relatively well on her current plan of action which includes anti-inflammatory agents for her airway and therapy directed against reflux and thrush.  She does have some fatigue and I have asked her to work through the issue of whether or not Zyrtec and azelastine is contributing to her fatigue as noted above.  Assuming she does well we will see her back in this clinic in 6 months or earlier if there is a problem.  Laurette Schimke, MD Allergy / Immunology [facility]

## 2020-04-05 NOTE — Patient Instructions (Signed)
   1. Continue Symbicort 160 - 2 inhalations twice a day    2. Continue omeprazole 40 mg - 80 mg in AM + 80 mg in PM + famotidine 20 mg in evening  3. Continue nasal fluticasone 1-2 spray each nostril 1 time per day  4. Continue a combination of the following to address fungal disease:    A. Diflucan 100 one tablet every 2 weeks.  B. nystatin oral solution 1 teaspoon swish and swallow every morning  5. May use OTC Mucinex DM, Zyrtec, azelastine, and ProAir HFA as needed  6. Can discontinue Zyrtec for 2 weeks and then azelastine for 2 weeks to see if this contributes to fatigue  7. Obtain fall flu vaccine  8. Return to clinic in 6 months or earlier if problem

## 2020-04-06 ENCOUNTER — Encounter: Payer: Self-pay | Admitting: Allergy and Immunology

## 2020-04-06 ENCOUNTER — Telehealth: Payer: Self-pay | Admitting: Cardiology

## 2020-04-06 NOTE — Telephone Encounter (Signed)
Patient's PCP called requesting a refill for rosuvastatin. I informed them the patient will need to schedule an appointment for future refills.

## 2020-04-07 DIAGNOSIS — S161XXA Strain of muscle, fascia and tendon at neck level, initial encounter: Secondary | ICD-10-CM | POA: Diagnosis not present

## 2020-04-11 ENCOUNTER — Ambulatory Visit
Admission: RE | Admit: 2020-04-11 | Discharge: 2020-04-11 | Disposition: A | Discharge status: Home / Self Care | Payer: PPO | Source: Ambulatory Visit | Attending: Geriatric Medicine | Admitting: Geriatric Medicine

## 2020-04-11 ENCOUNTER — Other Ambulatory Visit: Payer: Self-pay

## 2020-04-11 DIAGNOSIS — Z1231 Encounter for screening mammogram for malignant neoplasm of breast: Secondary | ICD-10-CM

## 2020-04-14 ENCOUNTER — Other Ambulatory Visit: Payer: Self-pay | Admitting: Cardiology

## 2020-04-25 NOTE — Progress Notes (Signed)
Cardiology Office Note   Date:  04/29/2020   ID:  Victoria Trevino, DOB November 25, 1940, MRN [id number]  PCP:  Merlene Laughter, MD  Cardiologist:  Dr. Anne Fu     Chief Complaint  Patient presents with  . Hyperlipidemia      History of Present Illness: Victoria Trevino is a 79 y.o. female who presents for follow up for lipids.  Hx of asthma, has been seen for intermittent SOB coronary CTA Ca+ score of 74 mild non obstructive -    Today she feels well. No chest pain or SOB.  Her BP is lower end but she tells me she runs low.  No lightheadedness or dizziness or syncope.  On no meds for BP.  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  She had lipids in 02/2020 with LDL 45 HDL 75 and Tchol 132.  She eats a healthy diet as well.   Past Medical History:  Diagnosis Date  . Allergic rhinoconjunctivitis 08/02/2015  . Asthma   . Asthma, well controlled 10/31/2017  . GERD (gastroesophageal reflux disease)   . Hereditary and idiopathic peripheral neuropathy 02/16/2016  . Left wrist injury 01/18/2017  . LPRD (laryngopharyngeal reflux disease) 08/02/2015  . Moderate persistent asthma 08/02/2015  . Thrush, oral 10/31/2017    Past Surgical History:  Procedure Laterality Date  . Cervical decompression and fusion  1997  . ECTOPIC PREGNANCY SURGERY    . KNEE SURGERY    . nissan infundibulm    . THROAT SURGERY       Current Outpatient Medications  Medication Sig Dispense Refill  . Acetaminophen (TYLENOL PO) Take by mouth as needed.    Marland Kitchen albuterol (PROVENTIL HFA) 108 (90 Base) MCG/ACT inhaler Inhale 2 puffs into the lungs every 6 (six) hours as needed for wheezing or shortness of breath. 54 g 1  . aspirin EC 81 MG tablet Take 1 tablet (81 mg total) by mouth daily.    Marland Kitchen azelastine (ASTELIN) 0.1 % nasal spray Place 2 sprays into both nostrils 2 (two) times daily. 90 mL 1  . budesonide-formoterol (SYMBICORT) 160-4.5  MCG/ACT inhaler Inhale 2 puffs by mouth twice a day to prevent cough or wheezing.  Rinse, gargle, and spit after use. 3 Inhaler 1  . cetirizine (ZYRTEC) 10 MG tablet Take 1 tablet (10 mg total) by mouth daily. 90 tablet 1  . Cholecalciferol (VITAMIN D-3) 1000 UNITS CAPS Take 1,000 Units by mouth daily.    . famotidine (PEPCID) 40 MG tablet Take 1 tablet by mouth once daily in the evening (Patient taking differently: Take 80 mg by mouth 2 (two) times daily. Take 1 tablet by mouth once daily in the evenin) 30 tablet 5  . fluconazole (DIFLUCAN) 100 MG tablet TAKE 1 TABLET BY MOUTH EVERY 14 DAYS 6 tablet 1  . fluticasone (FLONASE) 50 MCG/ACT nasal spray Use one spray in each nostril once daily. 48 g 1  . Mometasone Furoate (ASMANEX HFA) 200 MCG/ACT AERO Inhale 2 puffs into the lungs 2 (two) times daily. 39 g 1  . nystatin (MYCOSTATIN) 100000 UNIT/ML suspension 1 teaspoon swish and swallow every morning. 473 mL 1  . omeprazole (PRILOSEC) 40 MG capsule Take 40 mg by mouth 2 (two) times a day.     . rosuvastatin (CRESTOR) 5 MG tablet Take 1 tablet (5 mg total) by mouth daily. 90 tablet 0  . Saline (  SIMPLY SALINE) 0.9 % AERS Place 2 sprays into the nose 2 (two) times a day.      No current facility-administered medications for this visit.    Allergies:   Valium [diazepam]    Social History:  The patient  reports that she has never smoked. She has never used smokeless tobacco. She reports that she does not drink alcohol and does not use drugs.   Family History:  The patient's family history includes Allergic rhinitis in her sister; Alzheimer's disease in her mother; Aneurysm in her brother; Congestive Heart Failure in her father; Emphysema in her father; Heart attack in her father; Prostate cancer in her maternal grandfather; Skin cancer in her brother and sister; Stroke in her maternal grandmother.    ROS:  General:no colds or fevers, some weight loss from Feb this year.  she tells me her appetite is  good Skin:no rashes or ulcers HEENT:no blurred vision, no congestion CV:see HPI PUL:see HPI GI:no diarrhea constipation or melena, no indigestion GU:no hematuria, no dysuria MS:no joint pain, no claudication Neuro:no syncope, no lightheadedness Endo:no diabetes, no thyroid disease  Wt Readings from Last 3 Encounters:  04/28/20 117 lb 12.8 oz (53.4 kg)  11/25/19 116 lb (52.6 kg)  10/06/19 122 lb (55.3 kg)     PHYSICAL EXAM: VS:  BP 94/62   Pulse 67   Ht 5\' 7"  (1.702 m)   Wt 117 lb 12.8 oz (53.4 kg)   SpO2 96%   BMI 18.45 kg/m  , BMI Body mass index is 18.45 kg/m. General:Pleasant affect, NAD Skin:Warm and dry, brisk capillary refill HEENT:normocephalic, sclera clear, mucus membranes moist Neck:supple, no JVD, no bruits  Heart:S1S2 RRR without murmur, gallup, rub or click Lungs:clear without rales, rhonchi, or wheezes , non tender, + BS, do not palpate liver spleen or masses Ext:no lower ext edema, 2+ pedal pulses, 2+ radial pulses Neuro:alert and oriented X 3, MAE, follows commands, + facial symmetry    EKG:  EKG is ordered today. The ekg ordered today demonstrates SR at 67 no acute ST changes, stable EKG   Recent Labs: 05/28/2019: ALT 20    Lipid Panel    Component Value Date/Time   CHOL 129 05/28/2019 1100   TRIG 71 05/28/2019 1100   HDL 76 05/28/2019 1100   CHOLHDL 1.7 05/28/2019 1100   LDLCALC 39 05/28/2019 1100       Other studies Reviewed: Additional studies/ records that were reviewed today include: . Echo 02/16/19  IMPRESSIONS    1. The left ventricle has normal systolic function, with an ejection  fraction of 55-60%. The cavity size was normal. Left ventricular diastolic  parameters were normal.  2. Normal GLS -19.6.  3. The right ventricle has normal systolic function. The cavity was  normal. There is no increase in right ventricular wall thickness.  4. The mitral valve is degenerative. Moderate thickening of the mitral  valve  leaflet. Mild calcification of the mitral valve leaflet.  5. The aortic valve is tricuspid. Moderate thickening of the aortic  valve. Sclerosis without any evidence of stenosis of the aortic valve.  Aortic valve regurgitation is mild by color flow Doppler.   FINDINGS  Left Ventricle: The left ventricle has normal systolic function, with an  ejection fraction of 55-60%. The cavity size was normal. There is no  increase in left ventricular wall thickness. Left ventricular diastolic  parameters were normal. Normal GLS  -19.6.   Right Ventricle: The right ventricle has normal systolic function. The  cavity was normal. There is no increase in right ventricular wall  thickness.   Left Atrium: Left atrial size was normal in size.   Right Atrium: Right atrial size was normal in size. Right atrial pressure  is estimated at 10 mmHg.   Interatrial Septum: No atrial level shunt detected by color flow Doppler.   Pericardium: There is no evidence of pericardial effusion.   Mitral Valve: The mitral valve is degenerative in appearance. Moderate  thickening of the mitral valve leaflet. Mild calcification of the mitral  valve leaflet. Mitral valve regurgitation is mild by color flow Doppler.   Tricuspid Valve: The tricuspid valve is normal in structure. Tricuspid  valve regurgitation is mild by color flow Doppler.   Aortic Valve: The aortic valve is tricuspid Moderate thickening of the  aortic valve. Sclerosis without any evidence of stenosis of the aortic  valve. Aortic valve regurgitation is mild by color flow Doppler.   Pulmonic Valve: The pulmonic valve was grossly normal. Pulmonic valve  regurgitation is mild by color flow Doppler.   ASSESSMENT AND PLAN:  1.  Hyperlipidemia with FH hx of CAD and Echo in 2020 with EF 60% and aortic sclerosis no stenosis and no SOB now.    2.  CAD-- no angian Cardiac CTA with coronary calcium score of 74 normal coronary origin with right dominance, mild  to non obstructive CAD.  She is now on ASA 81 daily and statin with excellent lipids. On crestor.  Follow up in 1 year with Dr. Anne Fu.   Current medicines are reviewed with the patient today.  The patient Has no concerns regarding medicines.  The following changes have been made:  See above Labs/ tests ordered today include:see above  Disposition:   FU:  see above  Signed, Nada Boozer, NP  04/29/2020 5:22 PM    [facility] [address] [place], [place], [place]  [address]/ [address] [place], [place] Phone: [phone]; Fax: [phone]  [phone]

## 2020-04-28 ENCOUNTER — Other Ambulatory Visit: Payer: Self-pay

## 2020-04-28 ENCOUNTER — Encounter: Payer: Self-pay | Admitting: Cardiology

## 2020-04-28 ENCOUNTER — Ambulatory Visit: Payer: PPO | Admitting: Cardiology

## 2020-04-28 VITALS — BP 94/62 | HR 67 | Ht 67.0 in | Wt 117.8 lb

## 2020-04-28 DIAGNOSIS — I2510 Atherosclerotic heart disease of native coronary artery without angina pectoris: Secondary | ICD-10-CM

## 2020-04-28 DIAGNOSIS — E782 Mixed hyperlipidemia: Secondary | ICD-10-CM | POA: Diagnosis not present

## 2020-04-28 NOTE — Patient Instructions (Signed)
Medication Instructions:  Your physician recommends that you continue on your current medications as directed. Please refer to the Current Medication list given to you today.  *If you need a refill on your cardiac medications before your next appointment, please call your pharmacy*   Lab Work: None ordered  If you have labs (blood work) drawn today and your tests are completely normal, you will receive your results only by: . MyChart Message (if you have MyChart) OR . A paper copy in the mail If you have any lab test that is abnormal or we need to change your treatment, we will call you to review the results.   Testing/Procedures: None ordered   Follow-Up: At [facility], you and your health needs are our priority.  As part of our continuing mission to provide you with exceptional heart care, we have created designated Provider Care Teams.  These Care Teams include your primary Cardiologist (physician) and Advanced Practice Providers (APPs -  Physician Assistants and Nurse Practitioners) who all work together to provide you with the care you need, when you need it.  We recommend signing up for the patient portal called "MyChart".  Sign up information is provided on this After Visit Summary.  MyChart is used to connect with patients for Virtual Visits (Telemedicine).  Patients are able to view lab/test results, encounter notes, upcoming appointments, etc.  Non-urgent messages can be sent to your provider as well.   To learn more about what you can do with MyChart, go to [email or web address].    Your next appointment:   12 month(s)  The format for your next appointment:   In Person  Provider:   You may see Mark Skains, MD or one of the following Advanced Practice Providers on your designated Care Team:    Lori Gerhardt, NP  Laura Ingold, NP  Jill McDaniel, NP    Other Instructions   

## 2020-04-29 ENCOUNTER — Encounter: Payer: Self-pay | Admitting: Cardiology

## 2020-05-02 DIAGNOSIS — J4530 Mild persistent asthma, uncomplicated: Secondary | ICD-10-CM | POA: Diagnosis not present

## 2020-05-02 DIAGNOSIS — M8580 Other specified disorders of bone density and structure, unspecified site: Secondary | ICD-10-CM | POA: Diagnosis not present

## 2020-05-02 DIAGNOSIS — E7800 Pure hypercholesterolemia, unspecified: Secondary | ICD-10-CM | POA: Diagnosis not present

## 2020-05-11 DIAGNOSIS — H401213 Low-tension glaucoma, right eye, severe stage: Secondary | ICD-10-CM | POA: Diagnosis not present

## 2020-06-01 DIAGNOSIS — E7800 Pure hypercholesterolemia, unspecified: Secondary | ICD-10-CM | POA: Diagnosis not present

## 2020-06-01 DIAGNOSIS — J4530 Mild persistent asthma, uncomplicated: Secondary | ICD-10-CM | POA: Diagnosis not present

## 2020-06-01 DIAGNOSIS — M8580 Other specified disorders of bone density and structure, unspecified site: Secondary | ICD-10-CM | POA: Diagnosis not present

## 2020-06-29 DIAGNOSIS — E7800 Pure hypercholesterolemia, unspecified: Secondary | ICD-10-CM | POA: Diagnosis not present

## 2020-06-29 DIAGNOSIS — M8580 Other specified disorders of bone density and structure, unspecified site: Secondary | ICD-10-CM | POA: Diagnosis not present

## 2020-06-29 DIAGNOSIS — J4530 Mild persistent asthma, uncomplicated: Secondary | ICD-10-CM | POA: Diagnosis not present

## 2020-06-30 DIAGNOSIS — Z23 Encounter for immunization: Secondary | ICD-10-CM | POA: Diagnosis not present

## 2020-07-03 ENCOUNTER — Other Ambulatory Visit: Payer: Self-pay | Admitting: Allergy and Immunology

## 2020-07-08 ENCOUNTER — Other Ambulatory Visit: Payer: Self-pay | Admitting: Cardiology

## 2020-07-08 MED ORDER — ROSUVASTATIN CALCIUM 5 MG PO TABS: 5.0000 mg | ORAL_TABLET | Freq: Every day | ORAL | 2 refills | Status: DC

## 2020-07-08 NOTE — Telephone Encounter (Signed)
*  STAT* If patient is at the pharmacy, call can be transferred to refill team.   1. Which medications need to be refilled? (please list name of each medication and dose if known) rosuvastatin (CRESTOR) 5 MG tablet  2. Which pharmacy/location (including street and city if local pharmacy) is medication to be sent to? [facility] - [place], [place] - [place] [id number]  3. Do they need a 30 day or 90 day supply? 90 day

## 2020-07-08 NOTE — Telephone Encounter (Signed)
Pt's medication was sent to pt's pharmacy as requested. Confirmation received.  °

## 2020-07-21 ENCOUNTER — Other Ambulatory Visit: Payer: Self-pay | Admitting: Allergy and Immunology

## 2020-08-01 DIAGNOSIS — E7800 Pure hypercholesterolemia, unspecified: Secondary | ICD-10-CM | POA: Diagnosis not present

## 2020-08-01 DIAGNOSIS — K219 Gastro-esophageal reflux disease without esophagitis: Secondary | ICD-10-CM | POA: Diagnosis not present

## 2020-08-01 DIAGNOSIS — J4530 Mild persistent asthma, uncomplicated: Secondary | ICD-10-CM | POA: Diagnosis not present

## 2020-08-01 DIAGNOSIS — M8580 Other specified disorders of bone density and structure, unspecified site: Secondary | ICD-10-CM | POA: Diagnosis not present

## 2020-08-21 ENCOUNTER — Other Ambulatory Visit: Payer: Self-pay | Admitting: Allergy and Immunology

## 2020-08-31 DIAGNOSIS — M8580 Other specified disorders of bone density and structure, unspecified site: Secondary | ICD-10-CM | POA: Diagnosis not present

## 2020-08-31 DIAGNOSIS — J4530 Mild persistent asthma, uncomplicated: Secondary | ICD-10-CM | POA: Diagnosis not present

## 2020-08-31 DIAGNOSIS — E7800 Pure hypercholesterolemia, unspecified: Secondary | ICD-10-CM | POA: Diagnosis not present

## 2020-08-31 DIAGNOSIS — K219 Gastro-esophageal reflux disease without esophagitis: Secondary | ICD-10-CM | POA: Diagnosis not present

## 2020-09-30 DIAGNOSIS — J4530 Mild persistent asthma, uncomplicated: Secondary | ICD-10-CM | POA: Diagnosis not present

## 2020-09-30 DIAGNOSIS — M8580 Other specified disorders of bone density and structure, unspecified site: Secondary | ICD-10-CM | POA: Diagnosis not present

## 2020-09-30 DIAGNOSIS — E7800 Pure hypercholesterolemia, unspecified: Secondary | ICD-10-CM | POA: Diagnosis not present

## 2020-09-30 DIAGNOSIS — K219 Gastro-esophageal reflux disease without esophagitis: Secondary | ICD-10-CM | POA: Diagnosis not present

## 2020-10-04 ENCOUNTER — Encounter: Payer: Self-pay | Admitting: Allergy and Immunology

## 2020-10-04 ENCOUNTER — Other Ambulatory Visit: Payer: Self-pay

## 2020-10-04 ENCOUNTER — Ambulatory Visit: Payer: PPO | Admitting: Allergy and Immunology

## 2020-10-04 VITALS — BP 100/80 | HR 88 | Temp 96.3°F | Resp 18 | Ht 67.0 in | Wt 118.2 lb

## 2020-10-04 DIAGNOSIS — K219 Gastro-esophageal reflux disease without esophagitis: Secondary | ICD-10-CM

## 2020-10-04 DIAGNOSIS — J3089 Other allergic rhinitis: Secondary | ICD-10-CM | POA: Diagnosis not present

## 2020-10-04 DIAGNOSIS — B370 Candidal stomatitis: Secondary | ICD-10-CM | POA: Diagnosis not present

## 2020-10-04 DIAGNOSIS — J4540 Moderate persistent asthma, uncomplicated: Secondary | ICD-10-CM | POA: Diagnosis not present

## 2020-10-04 MED ORDER — BREZTRI AEROSPHERE 160-9-4.8 MCG/ACT IN AERO: INHALATION_SPRAY | RESPIRATORY_TRACT | 5 refills | Status: DC

## 2020-10-04 MED ORDER — FLUTICASONE PROPIONATE 50 MCG/ACT NA SUSP: NASAL | 1 refills | Status: DC

## 2020-10-04 MED ORDER — ALBUTEROL SULFATE HFA 108 (90 BASE) MCG/ACT IN AERS: 2.0000 | INHALATION_SPRAY | Freq: Four times a day (QID) | RESPIRATORY_TRACT | 1 refills | Status: DC | PRN

## 2020-10-04 MED ORDER — OMEPRAZOLE 40 MG PO CPDR: 40.0000 mg | DELAYED_RELEASE_CAPSULE | Freq: Two times a day (BID) | ORAL | 3 refills | Status: DC

## 2020-10-04 MED ORDER — AZELASTINE HCL 0.1 % NA SOLN: 2.0000 | Freq: Two times a day (BID) | NASAL | 1 refills | Status: DC

## 2020-10-04 MED ORDER — FAMOTIDINE 20 MG PO TABS: 20.0000 mg | ORAL_TABLET | Freq: Every evening | ORAL | 5 refills | Status: DC

## 2020-10-04 NOTE — Patient Instructions (Addendum)
   1. START Breztri - 2 inhalations twice a day (Symbicort)  2. Continue omeprazole 40 mg - 80 mg in AM + 80 mg in PM + famotidine 20 mg in evening  3. Continue nasal fluticasone 1-2 spray each nostril 1 time per day  4. Continue a combination of the following to address fungal disease:    A. Diflucan 100 one tablet every 2 weeks.  B. nystatin oral solution 1 teaspoon swish and swallow every morning  5. May use OTC Mucinex DM, azelastine, and ProAir HFA as needed  6. Return to clinic in 4 weeks or earlier if problem

## 2020-10-04 NOTE — Progress Notes (Signed)
[place] [facility] [place] - [place] - [place] [facility] [place]   Follow-up Note  Referring Provider: Merlene Laughter, MD Primary Provider: Merlene Laughter, MD Date of Office Visit: 10/04/2020  Subjective:   Victoria Trevino (DOB: Jan 21, 1941) is a 80 y.o. female who [facility] in re-evaluation of the following:  HPI: Victoria Trevino returns to this clinic in reevaluation of asthma and allergic rhinitis and LPR and history of recurrent thrush.  Her last visit to this clinic was 05 April 2020.  She has continued to do wonderful with her airway issue while consistently using anti-inflammatory agents for both her upper and lower airway.  She has not required a systemic steroid or antibiotic for any type of airway issue.  She rarely uses a short acting bronchodilator.  She can exercise but does get somewhat short of breath if she engages in a very high aerobic exercise.  She continues to use Symbicort and Flonase on a consistent basis.  She has had no issues with reflux while utilizing her very aggressive therapy directed against this condition.  She has had no issues with recurrent thrush on her current plan of a Diflucan every 2 weeks and some nystatin oral solution.  During her last visit she complained of having fatigue and I have asked her to discontinue her Zyrtec and indeed it was Zyrtec that was giving rise to that fatigue and she feels much better at this point in time.  She is received 3 Pfizer COVID vaccinations and a flu vaccine.  Allergies as of 10/04/2020      Reactions   Valium [diazepam]    hypersensitivity      Medication List      albuterol 108 (90 Base) MCG/ACT inhaler Commonly known as: Proventil HFA Inhale 2 puffs into the lungs every 6 (six) hours as needed for wheezing or shortness of breath.   Asmanex HFA 200 MCG/ACT Aero Generic drug: Mometasone Furoate Inhale 2 puffs into the lungs 2 (two) times daily.   aspirin EC 81 MG  tablet Take 1 tablet (81 mg total) by mouth daily.   Azelastine HCl 137 MCG/SPRAY Soln USE TWO SPRAYS IN EACH NOSTRIL TWICE DAILY   budesonide-formoterol 160-4.5 MCG/ACT inhaler Commonly known as: SYMBICORT INHALE TWO PUFFS into THE lungs TWICE DAILY TO prevent cough OR wheezing. RINSE, GARGLE, AND EXPECTORATE AFTER USE.   cetirizine 10 MG tablet Commonly known as: ZYRTEC Take 1 tablet (10 mg total) by mouth daily.   famotidine 40 MG tablet Commonly known as: PEPCID Take 1 tablet by mouth once daily in the evening   fluconazole 100 MG tablet Commonly known as: DIFLUCAN TAKE ONE TABLET BY MOUTH EVERY 14 DAYS   fluticasone 50 MCG/ACT nasal spray Commonly known as: FLONASE Use one spray in each nostril once daily.   nystatin 100000 UNIT/ML suspension Commonly known as: MYCOSTATIN SWISH AND swallow ONE TEASPOONFUL ( ) BY MOUTH EVERY MORNING   omeprazole 40 MG capsule Commonly known as: PRILOSEC Take 40 mg by mouth 2 (two) times a day.   rosuvastatin 5 MG tablet Commonly known as: CRESTOR Take 1 tablet (5 mg total) by mouth daily.   Saline 0.9 % Aers Place 2 sprays into the nose 2 (two) times a day.   TYLENOL PO Take by mouth as needed.   Vitamin D-3 25 MCG (1000 UT) Caps Take 1,000 Units by mouth daily.       Past Medical History:  Diagnosis Date  . Allergic rhinoconjunctivitis  08/02/2015  . Asthma   . Asthma, well controlled 10/31/2017  . GERD (gastroesophageal reflux disease)   . Hereditary and idiopathic peripheral neuropathy 02/16/2016  . Left wrist injury 01/18/2017  . LPRD (laryngopharyngeal reflux disease) 08/02/2015  . Moderate persistent asthma 08/02/2015  . Thrush, oral 10/31/2017    Past Surgical History:  Procedure Laterality Date  . Cervical decompression and fusion  1997  . ECTOPIC PREGNANCY SURGERY    . KNEE SURGERY    . nissan infundibulm    . THROAT SURGERY      Review of systems negative except as noted in HPI / PMHx or noted  below:  Review of Systems  Constitutional: Negative.   HENT: Negative.   Eyes: Negative.   Respiratory: Negative.   Cardiovascular: Negative.   Gastrointestinal: Negative.   Genitourinary: Negative.   Musculoskeletal: Negative.   Skin: Negative.   Neurological: Negative.   Endo/Heme/Allergies: Negative.   Psychiatric/Behavioral: Negative.      Objective:   Vitals:   10/04/20 1517  BP: 100/80  Pulse: 88  Resp: 18  Temp: (!) 96.3 F (35.7 C)  SpO2: 92%   Height: 5\' 7"  (170.2 cm)  Weight: 118 lb 3.2 oz (53.6 kg)   Physical Exam Constitutional:      Appearance: She is not diaphoretic.  HENT:     Head: Normocephalic.     Right Ear: Tympanic membrane, ear canal and external ear normal.     Left Ear: Tympanic membrane, ear canal and external ear normal.     Nose: Nose normal. No mucosal edema or rhinorrhea.     Mouth/Throat:     Mouth: Oropharynx is clear and moist and mucous membranes are normal.     Pharynx: Uvula midline. No oropharyngeal exudate.  Eyes:     Conjunctiva/sclera: Conjunctivae normal.  Neck:     Thyroid: No thyromegaly.     Trachea: Trachea normal. No tracheal tenderness or tracheal deviation.  Cardiovascular:     Rate and Rhythm: Normal rate and regular rhythm.     Heart sounds: Normal heart sounds, S1 normal and S2 normal. No murmur heard.   Pulmonary:     Effort: No respiratory distress.     Breath sounds: Normal breath sounds. No stridor. No wheezing or rales.  Musculoskeletal:        General: No edema.  Lymphadenopathy:     Head:     Right side of head: No tonsillar adenopathy.     Left side of head: No tonsillar adenopathy.     Cervical: No cervical adenopathy.  Skin:    Findings: No erythema or rash.     Nails: There is no clubbing.  Neurological:     Mental Status: She is alert.     Diagnostics:    Spirometry was performed and demonstrated an FEV1 of 1.10 at 49 % of predicted.  Assessment and Plan:   1. Asthma, moderate  persistent, well-controlled   2. Other allergic rhinitis   3. LPRD (laryngopharyngeal reflux disease)   4. Thrush, oral     1. START Breztri - 2 inhalations twice a day (Symbicort)  2. Continue omeprazole 40 mg - 80 mg in AM + 80 mg in PM + famotidine 20 mg in evening  3. Continue nasal fluticasone 1-2 spray each nostril 1 time per day  4. Continue a combination of the following to address fungal disease:    A. Diflucan 100 one tablet every 2 weeks.  B. nystatin oral solution 1 teaspoon  swish and swallow every morning  5. May use OTC Mucinex DM, azelastine, and ProAir HFA as needed  6. Return to clinic in 4 weeks or earlier if problem  Kurt appears to be doing quite well on her current plan of anti-inflammatory agents for her airway and aggressive therapy directed against reflux and a plan to prevent her from developing recurrent thrush.  But she does have some exercise-induced breathing difficulty and her spirometry is diminished from her previous reading and we will start her on a triple inhaler and see what happens over the course of the next 4 weeks.  She does have a history of glaucoma but this appears to be low-tension glaucoma and hopefully will not be affected by the anticholinergic component of her triple inhaler.  I have asked her not to restart oral antihistamines at this point as it did appear as though Zyrtec was giving rise to some fatigue in the past.     Laurette Schimke, MD Allergy / Immunology [facility]

## 2020-10-05 ENCOUNTER — Telehealth: Payer: Self-pay | Admitting: Allergy and Immunology

## 2020-10-05 ENCOUNTER — Encounter: Payer: Self-pay | Admitting: Allergy and Immunology

## 2020-10-05 MED ORDER — FAMOTIDINE 20 MG PO TABS: 20.0000 mg | ORAL_TABLET | Freq: Every evening | ORAL | 5 refills | Status: DC

## 2020-10-05 MED ORDER — BREZTRI AEROSPHERE 160-9-4.8 MCG/ACT IN AERO: INHALATION_SPRAY | RESPIRATORY_TRACT | 5 refills | Status: DC

## 2020-10-05 MED ORDER — FLUCONAZOLE 100 MG PO TABS: ORAL_TABLET | ORAL | 5 refills | Status: DC

## 2020-10-05 MED ORDER — OMEPRAZOLE 40 MG PO CPDR: 40.0000 mg | DELAYED_RELEASE_CAPSULE | Freq: Two times a day (BID) | ORAL | 3 refills | Status: DC

## 2020-10-05 MED ORDER — AZELASTINE HCL 0.1 % NA SOLN: 2.0000 | Freq: Two times a day (BID) | NASAL | 5 refills | Status: DC

## 2020-10-05 MED ORDER — ALBUTEROL SULFATE HFA 108 (90 BASE) MCG/ACT IN AERS: 2.0000 | INHALATION_SPRAY | Freq: Four times a day (QID) | RESPIRATORY_TRACT | 3 refills | Status: DC | PRN

## 2020-10-05 NOTE — Telephone Encounter (Signed)
Patient received a sample of Breztri yesterday. She said it only lasts for a week and she is going out of town tomorrow. She would like a prescription sent in to her pharmacy called [facility].

## 2020-10-05 NOTE — Telephone Encounter (Signed)
Patient called back and I informed her of the message and patient was appreciative and agreed to pick up her medications before her trip.

## 2020-10-05 NOTE — Telephone Encounter (Signed)
Called and left a message for patient to call our office back. I wanted to inform her that her PCP, [facility] called to confirm her pharmacy and asked that the medications be sent to her new pharmacy so that she can receive her medication in time before leaving the state.The medications have been sent in to her new pharmacy of her choice.

## 2020-10-05 NOTE — Addendum Note (Signed)
Addended by: Robet Leu A on: 10/05/2020 10:36 AM   Modules accepted: Orders

## 2020-10-27 DIAGNOSIS — E7800 Pure hypercholesterolemia, unspecified: Secondary | ICD-10-CM | POA: Diagnosis not present

## 2020-10-27 DIAGNOSIS — K219 Gastro-esophageal reflux disease without esophagitis: Secondary | ICD-10-CM | POA: Diagnosis not present

## 2020-10-27 DIAGNOSIS — J4530 Mild persistent asthma, uncomplicated: Secondary | ICD-10-CM | POA: Diagnosis not present

## 2020-10-27 DIAGNOSIS — M8580 Other specified disorders of bone density and structure, unspecified site: Secondary | ICD-10-CM | POA: Diagnosis not present

## 2020-11-08 ENCOUNTER — Ambulatory Visit: Payer: PPO | Admitting: Allergy and Immunology

## 2020-11-08 ENCOUNTER — Other Ambulatory Visit: Payer: Self-pay

## 2020-11-08 ENCOUNTER — Encounter: Payer: Self-pay | Admitting: Allergy and Immunology

## 2020-11-08 VITALS — BP 114/60 | HR 58 | Temp 97.6°F | Resp 16

## 2020-11-08 DIAGNOSIS — B370 Candidal stomatitis: Secondary | ICD-10-CM

## 2020-11-08 DIAGNOSIS — H524 Presbyopia: Secondary | ICD-10-CM | POA: Diagnosis not present

## 2020-11-08 DIAGNOSIS — K219 Gastro-esophageal reflux disease without esophagitis: Secondary | ICD-10-CM | POA: Diagnosis not present

## 2020-11-08 DIAGNOSIS — H353132 Nonexudative age-related macular degeneration, bilateral, intermediate dry stage: Secondary | ICD-10-CM | POA: Diagnosis not present

## 2020-11-08 DIAGNOSIS — H401221 Low-tension glaucoma, left eye, mild stage: Secondary | ICD-10-CM | POA: Diagnosis not present

## 2020-11-08 DIAGNOSIS — J3089 Other allergic rhinitis: Secondary | ICD-10-CM | POA: Diagnosis not present

## 2020-11-08 DIAGNOSIS — J4540 Moderate persistent asthma, uncomplicated: Secondary | ICD-10-CM

## 2020-11-08 DIAGNOSIS — H401213 Low-tension glaucoma, right eye, severe stage: Secondary | ICD-10-CM | POA: Diagnosis not present

## 2020-11-08 NOTE — Progress Notes (Unsigned)
[facility] [place] [facility] [place] [facility] [place] [facility] [place]   Follow-up Note  Referring Provider: Merlene Laughter, MD Primary Provider: Merlene Laughter, MD Date of Office Visit: 11/08/2020  Subjective:   Victoria Trevino (DOB: October 30, 1940) is a 80 y.o. female who [facility] in re-evaluation of the following:  HPI: Victoria Trevino returns to this clinic in evaluation of of asthma and a history of allergic rhinitis and a history of LPR and history of recurrent thrush.  I last saw her in this clinic on 04 October 2020.  During her last visit she was complaining about having some limitation in her ability to perform high-level aerobic exercise and we started her on a triple inhaler which was a step up from her dual inhaler.  Unfortunately, she has not been able to exercise since her last visit as she was tied up with very significant logistic issues regarding the death of her brother who died from emphysema and lung cancer the past month.  All of her other issues are stable including her upper airway disease and reflux and her history of recurrent thrush.  Allergies as of 11/08/2020      Reactions   Valium [diazepam]    hypersensitivity      Medication List    albuterol 108 (90 Base) MCG/ACT inhaler Commonly known as: Proventil HFA Inhale 2 puffs into the lungs every 6 (six) hours as needed for wheezing or shortness of breath.   Asmanex HFA 200 MCG/ACT Aero Generic drug: Mometasone Furoate Inhale 2 puffs into the lungs 2 (two) times daily.   azelastine 0.1 % nasal spray Commonly known as: ASTELIN Place 2 sprays into both nostrils 2 (two) times daily.   Breztri Aerosphere 160-9-4.8 MCG/ACT Aero Generic drug: Budeson-Glycopyrrol-Formoterol Inhale two puffs twice a day   cetirizine 10 MG tablet Commonly known as: ZYRTEC Take 1 tablet (10 mg total) by mouth daily.   famotidine 20 MG tablet Commonly known as: Pepcid Take 1 tablet (20 mg total) by  mouth at bedtime.   fluconazole 100 MG tablet Commonly known as: DIFLUCAN TAKE ONE TABLET BY MOUTH EVERY 14 DAYS   fluticasone 50 MCG/ACT nasal spray Commonly known as: FLONASE Use one spray in each nostril once daily.   Lumigan 0.01 % Soln Generic drug: bimatoprost 1 drop at bedtime.   omega-3 acid ethyl esters 1 g capsule Commonly known as: LOVAZA Take by mouth 2 (two) times daily.   omeprazole 40 MG capsule Commonly known as: PRILOSEC Take 1 capsule (40 mg total) by mouth in the morning and at bedtime. Take 2 pills in the morning and 2 pills at bedtime   Saline 0.9 % Aers Place 2 sprays into the nose 2 (two) times a day.   TYLENOL PO Take by mouth as needed.   Vitamin D-3 25 MCG (1000 UT) Caps Take 1,000 Units by mouth daily.       Past Medical History:  Diagnosis Date  . Allergic rhinoconjunctivitis 08/02/2015  . Asthma   . Asthma, well controlled 10/31/2017  . GERD (gastroesophageal reflux disease)   . Hereditary and idiopathic peripheral neuropathy 02/16/2016  . Left wrist injury 01/18/2017  . LPRD (laryngopharyngeal reflux disease) 08/02/2015  . Moderate persistent asthma 08/02/2015  . Thrush, oral 10/31/2017    Past Surgical History:  Procedure Laterality Date  . Cervical decompression and fusion  1997  . ECTOPIC PREGNANCY SURGERY    . KNEE SURGERY    . nissan infundibulm    .  THROAT SURGERY      Review of systems negative except as noted in HPI / PMHx or noted below:  Review of Systems  Constitutional: Negative.   HENT: Negative.   Eyes: Negative.   Respiratory: Negative.   Cardiovascular: Negative.   Gastrointestinal: Negative.   Genitourinary: Negative.   Musculoskeletal: Negative.   Skin: Negative.   Neurological: Negative.   Endo/Heme/Allergies: Negative.   Psychiatric/Behavioral: Negative.      Objective:   Vitals:   11/08/20 1038  BP: 114/60  Pulse: (!) 58  Resp: 16  Temp: 97.6 F (36.4 C)  SpO2: 98%          Physical  Exam Constitutional:      Appearance: She is not diaphoretic.  HENT:     Head: Normocephalic.     Right Ear: Tympanic membrane, ear canal and external ear normal.     Left Ear: Tympanic membrane, ear canal and external ear normal.     Nose: Nose normal. No mucosal edema or rhinorrhea.     Mouth/Throat:     Mouth: Oropharynx is clear and moist and mucous membranes are normal.     Pharynx: Uvula midline. No oropharyngeal exudate.  Eyes:     Conjunctiva/sclera: Conjunctivae normal.  Neck:     Thyroid: No thyromegaly.     Trachea: Trachea normal. No tracheal tenderness or tracheal deviation.  Cardiovascular:     Rate and Rhythm: Normal rate and regular rhythm.     Heart sounds: Normal heart sounds, S1 normal and S2 normal. No murmur heard.   Pulmonary:     Effort: No respiratory distress.     Breath sounds: Normal breath sounds. No stridor. No wheezing or rales.  Musculoskeletal:        General: No edema.  Lymphadenopathy:     Head:     Right side of head: No tonsillar adenopathy.     Left side of head: No tonsillar adenopathy.     Cervical: No cervical adenopathy.  Skin:    Findings: No erythema or rash.     Nails: There is no clubbing.  Neurological:     Mental Status: She is alert.      Diagnostics:    Spirometry was performed and demonstrated an FEV1 of 1.11 at 49 % of predicted.  The patient had an Asthma Control Test with the following results: ACT Total Score: 25.    Assessment and Plan:   1. Asthma, moderate persistent, well-controlled   2. Other allergic rhinitis   3. LPRD (laryngopharyngeal reflux disease)   4. Thrush, oral     1. Continue Breztri - 2 inhalations twice a day   2. Continue omeprazole 40 mg tablet - 80 mg in AM + 80 mg in PM + famotidine 20 mg in evening  3. Continue nasal fluticasone 1-2 spray each nostril 1 time per day  4. Continue a combination of the following to address fungal disease:    A. Diflucan 100 one tablet every 2  weeks.  B. nystatin oral solution 1 teaspoon swish and swallow every morning  5. May use OTC Mucinex DM, azelastine, and ProAir HFA as needed  6. Return to clinic in 12 weeks or earlier if problem  Victoria Trevino will continue to utilize a triple inhaler for the next 12 weeks and will see what type of effect we get utilizing this treatment regarding her exercise capacity.  All of her other issues appear to be under good control and she can continue to  address her issues with reflux and recurrent thrush as noted above.  I will see her back in his clinic in 12 weeks or earlier if there is a problem.  Laurette Schimke, MD Allergy / Immunology [facility]

## 2020-11-08 NOTE — Patient Instructions (Addendum)
   1. Continue Breztri - 2 inhalations twice a day   2. Continue omeprazole 40 mg tablet - 80 mg in AM + 80 mg in PM + famotidine 20 mg in evening  3. Continue nasal fluticasone 1-2 spray each nostril 1 time per day  4. Continue a combination of the following to address fungal disease:    A. Diflucan 100 one tablet every 2 weeks.  B. nystatin oral solution 1 teaspoon swish and swallow every morning  5. May use OTC Mucinex DM, azelastine, and ProAir HFA as needed  6. Return to clinic in 12 weeks or earlier if problem

## 2020-11-09 ENCOUNTER — Encounter: Payer: Self-pay | Admitting: Allergy and Immunology

## 2020-11-22 DIAGNOSIS — J4530 Mild persistent asthma, uncomplicated: Secondary | ICD-10-CM | POA: Diagnosis not present

## 2020-11-22 DIAGNOSIS — E7800 Pure hypercholesterolemia, unspecified: Secondary | ICD-10-CM | POA: Diagnosis not present

## 2020-11-22 DIAGNOSIS — M8580 Other specified disorders of bone density and structure, unspecified site: Secondary | ICD-10-CM | POA: Diagnosis not present

## 2020-11-22 DIAGNOSIS — K219 Gastro-esophageal reflux disease without esophagitis: Secondary | ICD-10-CM | POA: Diagnosis not present

## 2020-12-19 ENCOUNTER — Encounter: Payer: Self-pay | Admitting: Orthopedic Surgery

## 2020-12-19 ENCOUNTER — Ambulatory Visit: Payer: PPO | Admitting: Orthopedic Surgery

## 2020-12-19 ENCOUNTER — Ambulatory Visit: Payer: Self-pay

## 2020-12-19 DIAGNOSIS — M5441 Lumbago with sciatica, right side: Secondary | ICD-10-CM | POA: Diagnosis not present

## 2020-12-19 MED ORDER — PREDNISONE 10 MG PO TABS: 10.0000 mg | ORAL_TABLET | Freq: Every day | ORAL | 0 refills | Status: DC

## 2020-12-19 NOTE — Progress Notes (Signed)
Office Visit Note   Patient: Victoria Trevino           Date of Birth: 01-20-1941           MRN: [id number] Visit Date: 12/19/2020              Requested by: Merlene Laughter, MD [address] [place],  [place] [address] PCP: Merlene Laughter, MD  Chief Complaint  Patient presents with  . Lower Back - Pain      HPI: Patient is an 80 year old woman who complains of 1 week history of radicular pain from her buttocks to the posterior aspect of the right thigh which has come and gone.  Denies any weakness.  Assessment & Plan: Visit Diagnoses:  1. Acute right-sided low back pain with right-sided sciatica     Plan: Recommended 10 mg of prednisone with breakfast and a heat on her lumbar spine for flareups.  Follow-up if symptoms worsen.  Follow-Up Instructions: Return if symptoms worsen or fail to improve.   Ortho Exam  Patient is alert, oriented, no adenopathy, well-dressed, normal affect, normal respiratory effort. Examination patient has a normal gait no pain with range of motion of the hip knee or ankle negative straight leg raise on the right she has no focal motor weakness in either lower extremity.  Imaging: No results found. No images are attached to the encounter.  Labs: Lab Results  Component Value Date   ESRSEDRATE 11 02/16/2016     No results found for: ALBUMIN, PREALBUMIN, CBC  No results found for: MG No results found for: VD25OH  No results found for: PREALBUMIN No flowsheet data found.   There is no height or weight on file to calculate BMI.  Orders:  Orders Placed This Encounter  Procedures  . XR Lumbar Spine 2-3 Views   Meds ordered this encounter  Medications  . predniSONE (DELTASONE) 10 MG tablet    Sig: Take 1 tablet (10 mg total) by mouth daily with breakfast.    Dispense:  30 tablet    Refill:  0     Procedures: No procedures performed  Clinical Data: No additional findings.  ROS:  All other systems negative, except  as noted in the HPI. Review of Systems  Objective: Vital Signs: There were no vitals taken for this visit.  Specialty Comments:  No specialty comments available.  PMFS History: Patient Active Problem List   Diagnosis Date Noted  . Asthma, well controlled 10/31/2017  . Thrush, oral 10/31/2017  . Left wrist injury 01/18/2017  . Hereditary and idiopathic peripheral neuropathy 02/16/2016  . LPRD (laryngopharyngeal reflux disease) 08/02/2015  . Allergic rhinoconjunctivitis 08/02/2015  . Moderate persistent asthma 08/02/2015   Past Medical History:  Diagnosis Date  . Allergic rhinoconjunctivitis 08/02/2015  . Asthma   . Asthma, well controlled 10/31/2017  . GERD (gastroesophageal reflux disease)   . Hereditary and idiopathic peripheral neuropathy 02/16/2016  . Left wrist injury 01/18/2017  . LPRD (laryngopharyngeal reflux disease) 08/02/2015  . Moderate persistent asthma 08/02/2015  . Thrush, oral 10/31/2017    Family History  Problem Relation Age of Onset  . Alzheimer's disease Mother        Deceased, 66  . Heart attack Father   . Congestive Heart Failure Father        Deceased, 72  . Emphysema Father   . Skin cancer Sister   . Allergic rhinitis Sister   . Aneurysm Brother   . Skin cancer Brother   .  Stroke Maternal Grandmother   . Prostate cancer Maternal Grandfather     Past Surgical History:  Procedure Laterality Date  . Cervical decompression and fusion  1997  . ECTOPIC PREGNANCY SURGERY    . KNEE SURGERY    . nissan infundibulm    . THROAT SURGERY     Social History   Occupational History  . Not on file  Tobacco Use  . Smoking status: Never Smoker  . Smokeless tobacco: Never Used  Vaping Use  . Vaping Use: Never used  Substance and Sexual Activity  . Alcohol use: No  . Drug use: No  . Sexual activity: Yes    Birth control/protection: Condom

## 2020-12-22 ENCOUNTER — Other Ambulatory Visit: Payer: Self-pay | Admitting: Allergy and Immunology

## 2020-12-27 DIAGNOSIS — K219 Gastro-esophageal reflux disease without esophagitis: Secondary | ICD-10-CM | POA: Diagnosis not present

## 2020-12-27 DIAGNOSIS — J4530 Mild persistent asthma, uncomplicated: Secondary | ICD-10-CM | POA: Diagnosis not present

## 2020-12-27 DIAGNOSIS — M8580 Other specified disorders of bone density and structure, unspecified site: Secondary | ICD-10-CM | POA: Diagnosis not present

## 2020-12-27 DIAGNOSIS — E7800 Pure hypercholesterolemia, unspecified: Secondary | ICD-10-CM | POA: Diagnosis not present

## 2021-01-04 DIAGNOSIS — E7800 Pure hypercholesterolemia, unspecified: Secondary | ICD-10-CM | POA: Diagnosis not present

## 2021-01-04 DIAGNOSIS — J4530 Mild persistent asthma, uncomplicated: Secondary | ICD-10-CM | POA: Diagnosis not present

## 2021-01-04 DIAGNOSIS — K219 Gastro-esophageal reflux disease without esophagitis: Secondary | ICD-10-CM | POA: Diagnosis not present

## 2021-01-04 DIAGNOSIS — M8580 Other specified disorders of bone density and structure, unspecified site: Secondary | ICD-10-CM | POA: Diagnosis not present

## 2021-01-04 DIAGNOSIS — F0280 Dementia in other diseases classified elsewhere without behavioral disturbance: Secondary | ICD-10-CM | POA: Diagnosis not present

## 2021-01-04 DIAGNOSIS — G301 Alzheimer's disease with late onset: Secondary | ICD-10-CM | POA: Diagnosis not present

## 2021-01-10 NOTE — Progress Notes (Addendum)
[facility]    Provider:  Dr Lucia GaskinsAhern Requesting Provider: Merlene LaughterStoneking, Hal, MD Primary Care Provider:  Merlene LaughterStoneking, Hal, MD  CC:  Memory loss  HPI:  Victoria Trevino is a 80 y.o. female here as requested by Merlene LaughterStoneking, Hal, MD for memory loss.  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 [name] in the past, benign essential tremor, memory lost diagnosed with mild cognitive impairment June 2021 at [facility], I reviewed "care everywhere" but any research notes are not available for me to review.  I reviewed Dr. [name]'s notes, she has a history of Alzheimer's disease, she is in a [facility] and they have told her she has some concerning memory loss, she still independent in all of her activities of daily living, she is under some stress at home as she is caring for a family member who has definite cognitive limitations for dementia and this has been a difficult management problem for her. She has been with [facility] since 2015 participating in drug studies.   Patient is here with son. She has had a lot of work up at [facility]. Her memory is not as good as it once was. They have not said anything to her about the MRIs, the amyloid testing, she doesn't get to know that. She has had extensive testing at [facility]. They have told her she has progressed to the point she has Alzheimer's disease. I discussed going back to [facility] because I do not have access to any of their research results. She is having difficulty with dates. Here with her son. She can compensate. She doesn't miss any of her medications. She uses a calendar. 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 Her son says she has a routine. She doesn't get lost. She  performs her own ADLs and IADLs. She lives independently, her son lives close by and checks on her daily. She gardens, she has a Production managernice vegetable garden. She is worried that she doesn't get prevention. Her mother died at [id number]. Mother had Alzheimer,  her grandmother was in her 8190s with Alzheimers. Still driving, still doing well, more short-term memory. Good insight and judgement. She has a Systems analystpersonal trainer and does Education officer, environmentalpersonal training. She has only fallen twice that she knows of, mechanical fall and once when she had the flu, she works very hard due to her imbalance (she has neuropathy). She eats very healthy.   Reviewed notes, labs and imaging from outside physicians, which showed:   Vitamin D was 85.2, CMP was normal with BUN 17 and creatinine 0.81, CBC was normal, B12 472, magnesium 2.3, February 05, 2020.   Review of Systems: Patient complains of symptoms per HPI as well as the following symptoms Alzheimer's. Pertinent negatives and positives per HPI. All others negative.   Social History   Socioeconomic History  . Marital status: Widowed    Spouse name: Not on file  . Number of children: Not on file  . Years of education: 3 yrs nursing school after H.S.  . Highest education level: Not on file  Occupational History  . Not on file  Tobacco Use  . Smoking status: Never Smoker  . Smokeless tobacco: Never Used  Vaping Use  . Vaping Use: Never used  Substance and Sexual Activity  . Alcohol use: Yes    Comment: a glass of wine 2-3 times per year  . Drug use: Never  . Sexual activity: Yes    Birth control/protection: Condom  Other Topics Concern  . Not on file  Social History Narrative   Lives alone in a one story home.  Has 2 children.  Retired Charity fundraiser.  Husband passed away in 10/31/2016.        Son lives next to her   Caffeine: NONE since 2000, was previously 1 cup coffee/day from 1969-2000   Social Determinants of Health   Financial Resource Strain: Not on file  Food Insecurity: Not on  file  Transportation Needs: Not on file  Physical Activity: Not on file  Stress: Not on file  Social Connections: Not on file  Intimate Partner Violence: Not on file    Family History  Problem Relation Age of Onset  . Alzheimer's disease Mother        Deceased, 52  . Hypercholesterolemia Mother   . Heart attack Father   . Congestive Heart Failure Father        Deceased, 81  . Emphysema Father        smoker  . CAD Father   . Benign prostatic hyperplasia Father   . Skin cancer Sister   . Allergic rhinitis Sister   . Hypertension Sister   . Hypercholesterolemia Sister   . Obesity Sister   . Irritable bowel syndrome Sister   . Aneurysm Brother   . Skin cancer Brother        basal cell  . Memory loss Brother   . COPD Brother   . Congestive Heart Failure Brother   . Emphysema Brother   . Stroke Maternal Grandmother   . Dementia Maternal Grandmother   . Prostate cancer Maternal Grandfather   . Dementia Maternal Grandfather   . Diabetes Son   . Hypertension Son   . Diabetes Daughter   . Asthma Daughter   . Stroke Paternal Grandmother   . CAD Paternal Grandfather     Past Medical History:  Diagnosis Date  . Allergic rhinoconjunctivitis 08/02/2015  . Allergies   . Arthritis    hands  . Asthma    Dr Laurette Schimke  . Asthma, well controlled 10/31/2017  . Blepharitis   . GERD (gastroesophageal reflux disease)   . Glaucoma   . Hereditary and idiopathic peripheral neuropathy 02/16/2016  . Hx of fracture    Left wrist 2018; right patella (separate incident) 2018; used braces  . Left wrist injury 01/18/2017  . LPRD (laryngopharyngeal reflux disease) 08/02/2015  . Macular degeneration   . Memory loss    in study at [place], [place] 02/2020  . Migraine   . Moderate persistent asthma 08/02/2015  . Osteopenia   . Sensorimotor neuropathy    moderate-severe with chronic denervation- Dr Allena Katz  . Thrush, oral 10/31/2017  . Vertigo    occasional positional    Patient Active  Problem List   Diagnosis Date Noted  . MCI (mild cognitive impairment) 01/12/2021  . Asthma, well controlled 10/31/2017  . Thrush, oral 10/31/2017  . Left wrist injury 01/18/2017  . Hereditary and idiopathic peripheral neuropathy 02/16/2016  . LPRD (laryngopharyngeal reflux disease) 08/02/2015  . Allergic rhinoconjunctivitis 08/02/2015  . Moderate persistent asthma 08/02/2015    Past Surgical History:  Procedure Laterality Date  . BUNIONECTOMY Right    Right great toe and little toe, pins in both, [place], [place]  .  CATARACT EXTRACTION, BILATERAL     with implants; R eye 08/2017 and L eye 09/2017  . Cervical decompression and fusion  1997  . COLONOSCOPY     2003, 2008, 2013  . ECTOPIC PREGNANCY SURGERY    . injection laryngeal plasty 2008     per notes from [facility]  . KNEE SURGERY Left    meniscus @ [employer], [place], [place]  . nissan infundibulm    . SALPINGECTOMY Right    [place], [place]  . THROAT SURGERY    . vocal cords injected with liquid bone  2005   [place], [place]    Current Outpatient Medications  Medication Sig Dispense Refill  . Acetaminophen (TYLENOL PO) Take 325 mg by mouth at bedtime as needed.    Marland Kitchen albuterol (PROVENTIL HFA) 108 (90 Base) MCG/ACT inhaler Inhale 2 puffs into the lungs every 6 (six) hours as needed for wheezing or shortness of breath. (Patient taking differently: Inhale 1 puff into the lungs every 6 (six) hours as needed for wheezing or shortness of breath.) 54 g 3  . azelastine (ASTELIN) 0.1 % nasal spray Place 2 sprays into both nostrils 2 (two) times daily. (Patient taking differently: Place 1 spray into both nostrils daily.) 30 mL 5  . bimatoprost (LUMIGAN) 0.01 % SOLN Place 1 drop into both eyes at bedtime.    . Budeson-Glycopyrrol-Formoterol (BREZTRI AEROSPHERE) 160-9-4.8 MCG/ACT AERO Inhale two puffs twice a day 10.7 g 5  . Calcium Carbonate-Vitamin D (CALCIUM 600+D) 600-200 MG-UNIT TABS Take 1 tablet by mouth  daily.    . Carboxymethylcellulose Sodium (ARTIFICIAL TEARS OP) Place 1 drop into both eyes. 2-3 times per day.    . Cholecalciferol (VITAMIN D-3) 1000 UNITS CAPS Take 2,000 Units by mouth daily.    Marland Kitchen donepezil (ARICEPT) 5 MG tablet Take 1 tablet (5 mg total) by mouth at bedtime. 90 tablet 6  . famotidine (PEPCID) 20 MG tablet Take 1 tablet (20 mg total) by mouth at bedtime. 30 tablet 5  . fluconazole (DIFLUCAN) 100 MG tablet TAKE ONE TABLET BY MOUTH EVERY 14 DAYS 10 tablet 5  . fluticasone (FLONASE) 50 MCG/ACT nasal spray USE ONE SPRAY IN EACH NOSTRIL ONCE DAILY 48 g 1  . nystatin (MYCOSTATIN) 100000 UNIT/ML suspension 4 mLs. Swish in throat after breakfast    . omega-3 acid ethyl esters (LOVAZA) 1 g capsule Take 2 g by mouth daily.    Marland Kitchen omeprazole (PRILOSEC) 40 MG capsule Take 1 capsule (40 mg total) by mouth in the morning and at bedtime. Take 2 pills in the morning and 2 pills at bedtime (Patient taking differently: Take 80 mg by mouth 2 (two) times daily. Take 2 caps 30 minutes before breakfast and supper) 120 capsule 3  . OVER THE COUNTER MEDICATION 1 each 2 (two) times daily. AREDS 2    . Polyethyl Glycol-Propyl Glycol (SYSTANE OP) Apply 1 drop to eye 2 (two) times daily.    . predniSONE (DELTASONE) 10 MG tablet Take 1 tablet (10 mg total) by mouth daily with breakfast. 30 tablet 0  . SIMPLY SALINE NA Place into the nose 2 (two) times daily.    . Mometasone Furoate (ASMANEX HFA) 200 MCG/ACT AERO Inhale 2 puffs into the lungs 2 (two) times daily. (Patient not taking: Reported on 01/11/2021) 39 g 1   No current facility-administered medications for this visit.    Allergies as of 01/11/2021 - Review Complete 12/19/2020  Allergen Reaction Noted  . Valium [diazepam]  11/25/2019  Vitals: BP 139/71 (BP Location: Right Arm, Patient Position: Sitting)   Pulse 69   Ht 5' 7.75" (1.721 m)   Wt 116 lb (52.6 kg)   BMI 17.77 kg/m  Last Weight:  Wt Readings from Last 1 Encounters:  01/11/21  116 lb (52.6 kg)   Last Height:   Ht Readings from Last 1 Encounters:  01/11/21 5' 7.75" (1.721 m)     Physical exam: Exam: Gen: NAD, conversant, well nourised, thin, well groomed                     CV: RRR, no MRG. No Carotid Bruits. No peripheral edema, warm, nontender Eyes: Conjunctivae clear without exudates or hemorrhage  Neuro: Detailed Neurologic Exam  Speech:    Speech is normal; fluent and spontaneous with normal comprehension.  Cognition:    The patient is oriented to person, place, and time;     recent and remote memory intact;     language fluent;     normal attention, concentration,     fund of knowledge Cranial Nerves:    The pupils are equal, round, and reactive to light. Pupils too small to visualize fundi.  Visual fields are full to finger confrontation. Extraocular movements are intact. Trigeminal sensation is intact and the muscles of mastication are normal. The face is symmetric. The palate elevates in the midline. Hearing intact. Voice is normal. Shoulder shrug is normal. The tongue has normal motion without fasciculations.   Coordination:    Normal finger to nose   Gait:    Heel-toe and tandem gait with imbalance (neuropathy)   Motor Observation:    No asymmetry, no atrophy, slight postural and intention tremor, high arches, thin ankles Tone:    Normal muscle tone.    Posture:    Posture is normal. normal erect    Strength:    Strength is V/V in the upper and lower limbs.      Sensation: decreased to all modalities distally (diagnosed peripheal neuropathy and evaluated int he past by neuromuscular expert Dr. Allena Katz at [facility])     Reflex Exam:  DTR's:    Absent AJs. Otherwise Deep tendon reflexes in the upper and lower extremities are normal bilaterally.   Toes:    The toes are downgoing bilaterally.   Clonus:    Clonus is absent.    Assessment/Plan:   Extremely lovely 80 y.o. female here as requested by Merlene Laughter, MD for  memory loss.  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 Allena Katz in the past, benign essential tremor, memory loss.Extensive FHx on both sides of alzheimers.The [facility] team has told her to seek care because they are concerned with her progression.  Addendum: 5/12: I spoke to Adelina Mings who is the Teacher, English as a foreign language [facility] [phone].  Patient had a amyloid PET scan which showed positive amyloid frontal lateral medial October 2021.  She did not have any information on MRI of the brain but she did have formal memory testing in January 2022 which showed mild cognitive impairment.  However due to research protocol I do not think I can be provided with the scans on CD or the full reports, patient can bring the results that she received in the mail which are basically the above.  Much appreciate our colleagues at [facility].  Patient did say when she last had an appointment  she was told its not mild cognitive impairment but that it is Alzheimer's, I do think at this time we should proceed with formal neurocognitive testing so that we have our own baseline and patient can continue to follow with [place] instead of [facility].I spoke to patient and explained the above. She was relieved they told me MCI and I explained MCi does not have to progress to Alzheimers. Lovely patient.  - She has been involved with the Healthy Brain Study since 2015 and had extensive testing. Since that is a research protocol I do not have access to all the testing and I discussed this with her and son, she could go to [facility] and see if they can release more information. I will try and call and see what they can release to me. I left a message for [facility] [phone]  - She has excellent judgement and insight and she is performing all her own ADLs and  IADLs. She is just lovely. Possibly the [facility] team was concerned for memory loss progression and risk of progresison to Alzheimers because at this time I do not appreciate a major neurocognitive disorder diagnosis.   - She has had MRIs, amyloid scans, lab testing. None further.   - "The XX Brain" Chana Bode  - Start Aricept 5mg . In 2-3 weeks if no side effects start taking 2 pills once daily. And then let me know and I can prescribe a 10mg  pill.If you have nightmares, change it ot the morning. Memantine is another medication we can add on - Dr. formal neurocognitive testing  - Follow up 4 months  Orders Placed This Encounter  Procedures  . Ambulatory referral to Neuropsychology   Meds ordered this encounter  Medications  . donepezil (ARICEPT) 5 MG tablet    Sig: Take 1 tablet (5 mg total) by mouth at bedtime.    Dispense:  90 tablet    Refill:  6    Cc: , MD,  Kieth Brightly, MD  Merlene Laughter, MD  [facility] [address] [place], [address] [place]  Phone [phone] Fax [phone]  I spent 60 minutes of face-to-face and non-face-to-face time with patient on the  1. MCI (mild cognitive impairment)   2. Late onset Alzheimer's dementia without behavioral disturbance (HCC)    diagnosis.  This included previsit chart review, lab review, study review, order entry, electronic health record documentation, patient education on the different diagnostic and therapeutic options, counseling and coordination of care, risks and benefits of management, compliance, or risk factor reduction

## 2021-01-11 ENCOUNTER — Encounter: Payer: Self-pay | Admitting: Neurology

## 2021-01-11 ENCOUNTER — Ambulatory Visit: Payer: PPO | Admitting: Neurology

## 2021-01-11 ENCOUNTER — Telehealth: Payer: Self-pay | Admitting: Neurology

## 2021-01-11 VITALS — BP 139/71 | HR 69 | Ht 67.75 in | Wt 116.0 lb

## 2021-01-11 DIAGNOSIS — F0280 Dementia in other diseases classified elsewhere without behavioral disturbance: Secondary | ICD-10-CM | POA: Diagnosis not present

## 2021-01-11 DIAGNOSIS — G301 Alzheimer's disease with late onset: Secondary | ICD-10-CM

## 2021-01-11 DIAGNOSIS — G3184 Mild cognitive impairment, so stated: Secondary | ICD-10-CM

## 2021-01-11 MED ORDER — DONEPEZIL HCL 5 MG PO TABS: 5.0000 mg | ORAL_TABLET | Freq: Every day | ORAL | 6 refills | Status: DC

## 2021-01-11 NOTE — Patient Instructions (Addendum)
"[facility]" Chana Bode Start Aricept 5mg . In 2-3 weeks if no side effects start taking 2 pills once daily. And then let me know and I can prescribe a 10mg  pill.If you have nightmares, change it ot the morning. Memantine is another medication we can add on Dr. Follow up 4 months  Donepezil tablets What is this medicine? DONEPEZIL (doe NEP e zil) is used to treat mild to moderate dementia caused by Alzheimer's disease. This medicine may be used for other purposes; ask your health care provider or pharmacist if you have questions. COMMON BRAND NAME(S): Aricept What should I tell my health care provider before I take this medicine? They need to know if you have any of these conditions:  asthma or other lung disease  difficulty passing urine  head injury  heart disease  history of irregular heartbeat  liver disease  seizures (convulsions)  stomach or intestinal disease, ulcers or stomach bleeding  an unusual or allergic reaction to donepezil, other medicines, foods, dyes, or preservatives  pregnant or trying to get pregnant  breast-feeding How should I use this medicine? Take this medicine by mouth with a glass of water. Follow the directions on the prescription label. You may take this medicine with or without food. Take this medicine at regular intervals. This medicine is usually taken before bedtime. Do not take it more often than directed. Continue to take your medicine even if you feel better. Do not stop taking except on your doctor's advice. If you are taking the 23 mg donepezil tablet, swallow it whole; do not cut, crush, or chew it. Talk to your pediatrician regarding the use of this medicine in children. Special care may be needed. Overdosage: If you think you have taken too much of this medicine contact a poison control center or emergency room at once. NOTE: This medicine is only for you. Do not share this medicine with others. What if I miss a  dose? If you miss a dose, take it as soon as you can. If it is almost time for your next dose, take only that dose, do not take double or extra doses. What may interact with this medicine? Do not take this medicine with any of the following medications:  certain medicines for fungal infections like itraconazole, fluconazole, posaconazole, and voriconazole  cisapride  dextromethorphan; quinidine  dronedarone  pimozide  quinidine  thioridazine This medicine may also interact with the following medications:  antihistamines for allergy, cough and cold  atropine  bethanechol  carbamazepine  certain medicines for bladder problems like oxybutynin, tolterodine  certain medicines for Parkinson's disease like benztropine, trihexyphenidyl  certain medicines for stomach problems like dicyclomine, hyoscyamine  certain medicines for travel sickness like scopolamine  dexamethasone  dofetilide  ipratropium  NSAIDs, medicines for pain and inflammation, like ibuprofen or naproxen  other medicines for Alzheimer's disease  other medicines that prolong the QT interval (cause an abnormal heart rhythm)  phenobarbital  phenytoin  rifampin, rifabutin or rifapentine  ziprasidone This list may not describe all possible interactions. Give your health care provider a list of all the medicines, herbs, non-prescription drugs, or dietary supplements you use. Also tell them if you smoke, drink alcohol, or use illegal drugs. Some items may interact with your medicine. What should I watch for while using this medicine? Visit your doctor or health care professional for regular checks on your progress. Check with your doctor or health care professional if your symptoms do not get better or if they  get worse. You may get drowsy or dizzy. Do not drive, use machinery, or do anything that needs mental alertness until you know how this drug affects you. What side effects may I notice from receiving  this medicine? Side effects that you should report to your doctor or health care professional as soon as possible:  allergic reactions like skin rash, itching or hives, swelling of the face, lips, or tongue  feeling faint or lightheaded, falls  loss of bladder control  seizures  signs and symptoms of a dangerous change in heartbeat or heart rhythm like chest pain; dizziness; fast or irregular heartbeat; palpitations; feeling faint or lightheaded, falls; breathing problems  signs and symptoms of infection like fever or chills; cough; sore throat; pain or trouble passing urine  signs and symptoms of liver injury like dark yellow or brown urine; general ill feeling or flu-like symptoms; light-colored stools; loss of appetite; nausea; right upper belly pain; unusually weak or tired; yellowing of the eyes or skin  slow heartbeat or palpitations  unusual bleeding or bruising  vomiting Side effects that usually do not require medical attention (report to your doctor or health care professional if they continue or are bothersome):  diarrhea, especially when starting treatment  headache  loss of appetite  muscle cramps  nausea  stomach upset This list may not describe all possible side effects. Call your doctor for medical advice about side effects. You may report side effects to FDA at 1-800-FDA-1088. Where should I keep my medicine? Keep out of reach of children. Store at room temperature between 15 and 30 degrees C (59 and 86 degrees F). Throw away any unused medicine after the expiration date. NOTE: This sheet is a summary. It may not cover all possible information. If you have questions about this medicine, talk to your doctor, pharmacist, or health care provider.  2021 Elsevier/Gold Standard (2018-08-11 10:33:41)   Recommendations to prevent or slow progression of cognitive decline:   Exercise You should increase exercise 30 to 45 minutes per day at least 3 days a week  although 5 to 7 would be preferred. Any type of exercise (including walking) is acceptable although a recumbent bicycle may be best if you are unsteady. Disease related apathy can be a significant roadblock to exercise and the only way to overcome this is to make it a daily routine and perhaps have a reward at the end (something your loved one loves to eat or drink perhaps) or a personal trainer coming to the home can also be very useful. In general a structured, repetitive schedule is best.   Cardiovascular Health: You should optimize all cardiovascular risk factors (blood pressure, sugar, cholesterol) as vascular disease such as strokes and heart attacks can make memory problems much worse.   Diet: Eating a heart healthy (Mediterranean) diet is also a good idea; fish and poultry instead of red meat, nuts (mostly non-peanuts), vegetables, fruits, olive oil or canola oil (instead of butter), minimal salt (use other spices to flavor foods), whole grain rice, bread, cereal and pasta and wine in moderation.  General Health: Any diseases which effect your body will effect your brain such as a pneumonia, urinary infection, blood clot, heart attack or stroke. Keep contact with your primary care doctor for regular follow ups.  Sleep. A good nights sleep is healthy for the brain. Seven hours is recommended. If you have insomnia or poor sleep habits see the recommendations below  Tips: Structured and consistent daytime and nighttime routine, including  regular wake times, bedtimes, and mealtimes, will be important for the patient to avoid confusion. Keeping frequently used items in designated places will help reduce stress from searching. If there are worries about getting lost do not let the patient leave home unaccompanied. They might benefit from wearing an identification bracelet that will help others assist in finding home if they become lost. Information about nationwide safe return services and other helpful  resources may be obtained through the Alzheimer's Association helpline at 1800-6204336450.  Finances, Power of Restaurant manager, fast food Directives: You should consider putting legal safeguards in place with regard to financial and medical decision making. While the spouse always has power of attorney for medical and financial issues in the absence of any form, you should consider what you want in case the spouse / caregiver is no longer around or capable of making decisions.   [place] : MovieEvening.com.au.pdf  Or Google "[facility]" AND "An Proofreader for [employer]  Other States: PainBasics.com.au  The signature on these forms should be notarized.   DRIVING:   Driving only during the day Drive only to familiar Locations Avoid driving during bad weather  If you would like to be tested to see if you are driving safely, [facility] has a Clinical Driving Evaluation. To schedule an appointment call [phone].                RESOURCES:  Memory Loss: Improve your short term memory By Laverle Patter  The Alzheimer's Reading Room http://www.alzheimersreadingroom.com/   The Alzheimer's Compendium http://www.alzcompend.info/  [employer] www.dukefamilysupport.YTK 201-231-5016  Recommended resources for caregivers (All can be purchased on [employer]):  1) A Caregiver's Guide to Dementia: Using Activities and Other Strategies to Prevent, Reduce and Manage Behavioral Symptoms by Mahala Menghini. Bethena Midget and [employer]   2) A Caregiver's Guide to Owens & Minor Dementia by Briant Sites MS BSN and Velia Meyer   3) What If It's Not Alzheimer's?: A Caregiver's Guide to Dementia by Neila Gear (Author), Roberts Gaudy (Editor)  3) The 36 hour day by Rabins and Mace  4) Understanding Difficult Behaviors by Gerre Scull  and White  Online course for helping caregivers reduce stress, guilt and frustration called the Caregivers Helpbook. The website is www.powerfultoolsforcaregivers.org  As a caregiver you are a Government social research officer. Problems you face as a caregiver are usually unique to your situation and the way your loved-one's disease manifests itself. The best way to use these books is to look at the Table of Contents and read any chapters of interest or that apply to challenges you are having as a caregiver.  NATIONAL RESOURCES: For more information on neurological disorders or research programs funded by the [employer] of Neurological Disorders and Stroke, contact the Institute's Gaffer (BRAIN) at: BRAIN [address] [place] [address] [phone] (toll-free) ToledoAutomobile.co.uk  Information on dementia is also available from the following organizations: Alzheimer's Disease Education and Referral (ADEAR) Center [employer] on Aging [address] [place] [address] [phone] (toll-free) CashCowGambling.be  Alzheimer's Association [address] [place], [place] [address] [phone] (toll-free, 24-hour helpline) [phone] (TDD) LimitLaws.hu  Alzheimer's Foundation of [place] [address] [place], [place] [address] [phone] (toll-free) www.alzfdn.org  Alzheimer's Drug Discovery Foundation [address], [address] [place], [place] [address] [phone] www.alzdiscovery.org  Association for Frontotemporal Degeneration [address] [place], [place] [address] [phone] (toll-free) www.theaftd.org  [facility] Foundation  [address] [place], [place] [phone] (toll-free) www.brightfocus.org/alzheimers  Earl GalaJohn Douglas French Alzheimer's Foundation [address] [place], [place]  [phone] www.BushWebsites.nljdfaf.org  Lewy Body Dementia Association [address], [place], [place] [id number] [phone] (toll-free LBD Caregiver Link) www.lbda.[facility]org  National Institute of Mental Health [address], [address] [place], [place] [phone] (toll-free) [phone] [facility](TTY) http://www.maynard.net/www.nimh.nih.gov  National Organization for Rare Disorders [address] [place], [place] [phone]-[phone]) (toll-free) www.rarediseases.org  The Dementias: Hope Through Research was jointly produced by the [employer] Institute of Neurological Disorders and Stroke (NINDS) and the [employer] Millsational Institute on Aging (NIA), both part of the Occidental Petroleumational Institutes of Health, the H. J. Heinznation's medical research agency--supporting scientific studies that turn discovery into health. NINDS is the nation's leading funder of research on the brain and nervous system. The NINDS mission is to reduce the burden of neurological disease. For more information and resources, visit ToledoAutomobile.co.ukwww.ninds.nih.gov [1] or call [phone]. NIA leads the federal government effort conducting and supporting research on aging and the health and well-being of older people. NIA's Alzheimer's Disease Education and Referral (ADEAR) Center offers information and publications on dementia and caregiving for families, caregivers, and professionals. For more information, visit CashCowGambling.bewww.nia.nih.gov/alzheimers [2] or call [phone]. Also available from NIA are publications and information about Alzheimer's disease as well as the booklets Frontotemporal Disorders: Information for Patients, Families, and Caregivers and Lewy Body Dementia: Information for Patients, Families, and Professionals. Source URL: VoiceZap.ishttp://www.nia.nih.gov/alzheimers/publication/dementias/resources

## 2021-01-11 NOTE — Telephone Encounter (Signed)
Pt called, have concerns regarding  donepezil (ARICEPT) 5 MG tablet comments stated to contact physician if you have asthma and I take fluconazole (DIFLUCAN) 100 MG tablet. Would like a call from the nurse.

## 2021-01-12 DIAGNOSIS — G3184 Mild cognitive impairment, so stated: Secondary | ICD-10-CM | POA: Insufficient documentation

## 2021-01-12 NOTE — Telephone Encounter (Signed)
I have no concerns with this. The two do not interact and asthma is not a contraindication. thanks

## 2021-01-12 NOTE — Telephone Encounter (Signed)
Spoke with patient and discussed message from Dr. Lucia Gaskins.  Patient aware it is okay for her to start Aricept as Dr. Daisy Blossom does not have any concerns and there is no contraindication with the asthma nor an interaction between those two medications. She verbalized appreciation for the call back.

## 2021-01-13 ENCOUNTER — Encounter: Payer: Self-pay | Admitting: Psychology

## 2021-01-28 ENCOUNTER — Other Ambulatory Visit: Payer: Self-pay | Admitting: Allergy and Immunology

## 2021-01-31 ENCOUNTER — Encounter: Payer: Self-pay | Admitting: Allergy and Immunology

## 2021-01-31 ENCOUNTER — Other Ambulatory Visit: Payer: Self-pay

## 2021-01-31 ENCOUNTER — Ambulatory Visit: Payer: PPO | Admitting: Allergy and Immunology

## 2021-01-31 VITALS — BP 92/60 | HR 75 | Temp 98.6°F | Resp 16 | Ht 67.75 in | Wt 118.4 lb

## 2021-01-31 DIAGNOSIS — B370 Candidal stomatitis: Secondary | ICD-10-CM | POA: Diagnosis not present

## 2021-01-31 DIAGNOSIS — J3089 Other allergic rhinitis: Secondary | ICD-10-CM | POA: Diagnosis not present

## 2021-01-31 DIAGNOSIS — J4540 Moderate persistent asthma, uncomplicated: Secondary | ICD-10-CM | POA: Diagnosis not present

## 2021-01-31 DIAGNOSIS — K219 Gastro-esophageal reflux disease without esophagitis: Secondary | ICD-10-CM | POA: Diagnosis not present

## 2021-01-31 MED ORDER — FAMOTIDINE 20 MG PO TABS: 20.0000 mg | ORAL_TABLET | Freq: Every evening | ORAL | 5 refills | Status: DC

## 2021-01-31 MED ORDER — BREZTRI AEROSPHERE 160-9-4.8 MCG/ACT IN AERO: INHALATION_SPRAY | RESPIRATORY_TRACT | 5 refills | Status: DC

## 2021-01-31 MED ORDER — FLUCONAZOLE 100 MG PO TABS: ORAL_TABLET | ORAL | 5 refills | Status: DC

## 2021-01-31 MED ORDER — ALBUTEROL SULFATE HFA 108 (90 BASE) MCG/ACT IN AERS: 2.0000 | INHALATION_SPRAY | Freq: Four times a day (QID) | RESPIRATORY_TRACT | 1 refills | Status: DC | PRN

## 2021-01-31 MED ORDER — OMEPRAZOLE 40 MG PO CPDR: 40.0000 mg | DELAYED_RELEASE_CAPSULE | Freq: Two times a day (BID) | ORAL | 4 refills | Status: DC

## 2021-01-31 MED ORDER — FLUTICASONE PROPIONATE 50 MCG/ACT NA SUSP: NASAL | 1 refills | Status: DC

## 2021-01-31 MED ORDER — AZELASTINE HCL 0.1 % NA SOLN: 2.0000 | Freq: Two times a day (BID) | NASAL | 5 refills | Status: DC

## 2021-01-31 NOTE — Patient Instructions (Addendum)
   1. Continue Breztri - 2 inhalations twice a day   2. Continue omeprazole 40 mg tablet - 80 mg in AM + 40 - 80 mg in PM + famotidine 20 mg in evening  3. Continue nasal fluticasone 1-2 spray each nostril 1 time per day  4. Continue a combination:    A. Diflucan 100 one tablet every 2 weeks.  B. nystatin oral solution 1 teaspoon swish and swallow every morning  5. May use OTC Mucinex DM, azelastine, and ProAir HFA as needed  6. Return to clinic in 6 months or earlier if problem

## 2021-01-31 NOTE — Progress Notes (Signed)
[place] - [place] - [place] - [place] - [place]   Follow-up Note  Referring Provider: Merlene Laughter, MD Primary Provider: Merlene Laughter, MD Date of Office Visit: 01/31/2021  Subjective:   Victoria Trevino (DOB: 1940/10/15) is a 80 y.o. female who [facility] in re-evaluation of the following:  HPI: Victoria Trevino returns to this clinic in reevaluation of asthma and allergic rhinitis and LPR and a history of recurrent thrush.  Her last visit to this clinic was 08 November 2020.  Her asthma has been under excellent control and she has not required a systemic steroid to treat an exacerbation and her requirement for short acting bronchodilator averages around 0/week.  Last week she did have a coughing spell associated with nose blowing and she just felt kind of weak and puny and laid around for about 4 days but fortunately most of that has resolved though she still has a little bit of lingering cough.  She works in the garden from about 6am-630am every morning and then she uses a trainer twice a week where she does an exercise program where she is in a full sweat.  Her reflux has been under excellent control on her current plan.  She uses high doses of proton pump inhibitor and she also uses an H2 receptor blocker.  She is attempted to consolidate her proton pump inhibitor by using just 80 mg of omeprazole in the morning and 40 in the evening.  Allergies as of 01/31/2021      Reactions   Valium [diazepam]    hypersensitivity      Medication List      albuterol 108 (90 Base) MCG/ACT inhaler Commonly known as: Proventil HFA Inhale 2 puffs into the lungs every 6 (six) hours as needed for wheezing or shortness of breath.   ARTIFICIAL TEARS OP Place 1 drop into both eyes. 2-3 times per day.   azelastine 0.1 % nasal spray Commonly known as: ASTELIN Place 2 sprays into both nostrils 2 (two) times daily.   Breztri Aerosphere 160-9-4.8 MCG/ACT  Aero Generic drug: Budeson-Glycopyrrol-Formoterol Inhale two puffs twice a day   Calcium Carbonate-Vitamin D 600-200 MG-UNIT Tabs Take 1 tablet by mouth daily.   donepezil 5 MG tablet Commonly known as: ARICEPT Take 1 tablet (5 mg total) by mouth at bedtime.   famotidine 20 MG tablet Commonly known as: Pepcid Take 1 tablet (20 mg total) by mouth at bedtime.   fluconazole 100 MG tablet Commonly known as: DIFLUCAN TAKE ONE TABLET BY MOUTH EVERY 14 DAYS   fluticasone 50 MCG/ACT nasal spray Commonly known as: FLONASE USE ONE SPRAY IN EACH NOSTRIL ONCE DAILY   Lumigan 0.01 % Soln Generic drug: bimatoprost Place 1 drop into both eyes at bedtime.   nystatin 100000 UNIT/ML suspension Commonly known as: MYCOSTATIN 4 mLs. Swish in throat after breakfast   omega-3 acid ethyl esters 1 g capsule Commonly known as: LOVAZA Take 2 g by mouth daily.   omeprazole 40 MG capsule Commonly known as: PRILOSEC Take Take 2 pills in the morning and 2 pills at bedtime   OVER THE COUNTER MEDICATION 1 each 2 (two) times daily. AREDS 2   SIMPLY SALINE NA Place into the nose 2 (two) times daily.   SYSTANE OP Apply 1 drop to eye 2 (two) times daily.   TYLENOL PO Take 325 mg by mouth at bedtime as needed.   Vitamin D-3 25 MCG (1000 UT) Caps Take 2,000 Units  by mouth daily.       Past Medical History:  Diagnosis Date  . Allergic rhinoconjunctivitis 08/02/2015  . Allergies   . Arthritis    hands  . Asthma    Dr Laurette Schimke  . Asthma, well controlled 10/31/2017  . Blepharitis   . GERD (gastroesophageal reflux disease)   . Glaucoma   . Hereditary and idiopathic peripheral neuropathy 02/16/2016  . Hx of fracture    Left wrist 2018; right patella (separate incident) 2018; used braces  . Left wrist injury 01/18/2017  . LPRD (laryngopharyngeal reflux disease) 08/02/2015  . Macular degeneration   . Memory loss    in study at [place], [place] 02/2020  . Migraine   . Moderate persistent  asthma 08/02/2015  . Osteopenia   . Sensorimotor neuropathy    moderate-severe with chronic denervation- Dr Allena Katz  . Thrush, oral 10/31/2017  . Vertigo    occasional positional    Past Surgical History:  Procedure Laterality Date  . BUNIONECTOMY Right    Right great toe and little toe, pins in both, [place], [place]  . CATARACT EXTRACTION, BILATERAL     with implants; R eye 08/2017 and L eye 09/2017  . Cervical decompression and fusion  1997  . COLONOSCOPY     2003, 2008, 2013  . ECTOPIC PREGNANCY SURGERY    . injection laryngeal plasty 2008     per notes from [facility]  . KNEE SURGERY Left    meniscus @ [employer], [place], [place]  . nissan infundibulm    . SALPINGECTOMY Right    [place], [place]  . THROAT SURGERY    . vocal cords injected with liquid bone  2005   [facility], [place]    Review of systems negative except as noted in HPI / PMHx or noted below:  Review of Systems  Constitutional: Negative.   HENT: Negative.   Eyes: Negative.   Respiratory: Negative.   Cardiovascular: Negative.   Gastrointestinal: Negative.   Genitourinary: Negative.   Musculoskeletal: Negative.   Skin: Negative.   Neurological: Negative.   Endo/Heme/Allergies: Negative.   Psychiatric/Behavioral: Negative.      Objective:   Vitals:   01/31/21 0910  BP: 92/60  Pulse: 75  Resp: 16  Temp: 98.6 F (37 C)  SpO2: 96%   Height: 5' 7.75" (172.1 cm)  Weight: 118 lb 6.4 oz (53.7 kg)   Physical Exam Constitutional:      Appearance: She is not diaphoretic.  HENT:     Head: Normocephalic.     Right Ear: Tympanic membrane, ear canal and external ear normal.     Left Ear: Tympanic membrane, ear canal and external ear normal.     Nose: Nose normal. No mucosal edema or rhinorrhea.     Mouth/Throat:     Pharynx: Uvula midline. No oropharyngeal exudate.  Eyes:     Conjunctiva/sclera: Conjunctivae normal.  Neck:     Thyroid: No thyromegaly.     Trachea: Trachea  normal. No tracheal tenderness or tracheal deviation.  Cardiovascular:     Rate and Rhythm: Normal rate and regular rhythm.     Heart sounds: Normal heart sounds, S1 normal and S2 normal. No murmur heard.   Pulmonary:     Effort: No respiratory distress.     Breath sounds: Normal breath sounds. No stridor. No wheezing or rales.  Lymphadenopathy:     Head:     Right side of head: No tonsillar adenopathy.     Left  side of head: No tonsillar adenopathy.     Cervical: No cervical adenopathy.  Skin:    Findings: No erythema or rash.     Nails: There is no clubbing.  Neurological:     Mental Status: She is alert.     Diagnostics:    Spirometry was performed and demonstrated an FEV1 of 1.79 at 80 % of predicted.   Assessment and Plan:   1. Asthma, moderate persistent, well-controlled   2. Other allergic rhinitis   3. LPRD (laryngopharyngeal reflux disease)   4. Thrush, oral     1. Continue Breztri - 2 inhalations twice a day   2. Continue omeprazole 40 mg tablet - 80 mg in AM + 40 - 80 mg in PM + famotidine 20 mg in evening  3. Continue nasal fluticasone 1-2 spray each nostril 1 time per day  4. Continue a combination:    A. Diflucan 100 one tablet every 2 weeks.  B. nystatin oral solution 1 teaspoon swish and swallow every morning  5. May use OTC Mucinex DM, azelastine, and ProAir HFA as needed  6. Return to clinic in 6 months or earlier if problem  Jaylie appears to be doing relatively well at this point in time on her current plan which includes a triple inhaler and aggressive therapy directed against LPR and some intermittent use of antifungal agents to address her Breztri induced oral thrush.  Assuming she does well with this plan I will see her back in this clinic every 6 months or earlier if there is a problem.  Laurette Schimke, MD Allergy / Immunology [facility]

## 2021-02-01 ENCOUNTER — Encounter: Payer: Self-pay | Admitting: Allergy and Immunology

## 2021-02-07 DIAGNOSIS — Z0000 Encounter for general adult medical examination without abnormal findings: Secondary | ICD-10-CM | POA: Diagnosis not present

## 2021-02-07 DIAGNOSIS — F0280 Dementia in other diseases classified elsewhere without behavioral disturbance: Secondary | ICD-10-CM | POA: Diagnosis not present

## 2021-02-07 DIAGNOSIS — E7800 Pure hypercholesterolemia, unspecified: Secondary | ICD-10-CM | POA: Diagnosis not present

## 2021-02-07 DIAGNOSIS — D692 Other nonthrombocytopenic purpura: Secondary | ICD-10-CM | POA: Diagnosis not present

## 2021-02-07 DIAGNOSIS — M8588 Other specified disorders of bone density and structure, other site: Secondary | ICD-10-CM | POA: Diagnosis not present

## 2021-02-07 DIAGNOSIS — J4530 Mild persistent asthma, uncomplicated: Secondary | ICD-10-CM | POA: Diagnosis not present

## 2021-02-07 DIAGNOSIS — G301 Alzheimer's disease with late onset: Secondary | ICD-10-CM | POA: Diagnosis not present

## 2021-02-07 DIAGNOSIS — G629 Polyneuropathy, unspecified: Secondary | ICD-10-CM | POA: Diagnosis not present

## 2021-02-07 DIAGNOSIS — K9089 Other intestinal malabsorption: Secondary | ICD-10-CM | POA: Diagnosis not present

## 2021-02-07 DIAGNOSIS — Z1389 Encounter for screening for other disorder: Secondary | ICD-10-CM | POA: Diagnosis not present

## 2021-02-07 DIAGNOSIS — K219 Gastro-esophageal reflux disease without esophagitis: Secondary | ICD-10-CM | POA: Diagnosis not present

## 2021-02-07 DIAGNOSIS — E441 Mild protein-calorie malnutrition: Secondary | ICD-10-CM | POA: Diagnosis not present

## 2021-02-07 DIAGNOSIS — Z79899 Other long term (current) drug therapy: Secondary | ICD-10-CM | POA: Diagnosis not present

## 2021-02-07 DIAGNOSIS — M8580 Other specified disorders of bone density and structure, unspecified site: Secondary | ICD-10-CM | POA: Diagnosis not present

## 2021-02-07 DIAGNOSIS — Z7189 Other specified counseling: Secondary | ICD-10-CM | POA: Diagnosis not present

## 2021-02-10 DIAGNOSIS — M8589 Other specified disorders of bone density and structure, multiple sites: Secondary | ICD-10-CM | POA: Diagnosis not present

## 2021-02-10 DIAGNOSIS — Z780 Asymptomatic menopausal state: Secondary | ICD-10-CM | POA: Diagnosis not present

## 2021-02-16 DIAGNOSIS — Z79899 Other long term (current) drug therapy: Secondary | ICD-10-CM | POA: Diagnosis not present

## 2021-02-20 ENCOUNTER — Telehealth: Payer: Self-pay | Admitting: Neurology

## 2021-02-20 NOTE — Telephone Encounter (Signed)
Pt request request refill donepezil (ARICEPT) 5 MG tablet at [facility]. Dr. Lucia Gaskins said she would change prescription for 10 mg.

## 2021-02-21 MED ORDER — DONEPEZIL HCL 10 MG PO TABS: 10.0000 mg | ORAL_TABLET | Freq: Every day | ORAL | 3 refills | Status: DC

## 2021-02-21 NOTE — Addendum Note (Signed)
Addended by: Judi Cong on: 02/21/2021 08:14 AM   Modules accepted: Orders

## 2021-02-21 NOTE — Telephone Encounter (Signed)
Refill sent to the pharmacy and corrected the dose for 10 mg tab daily

## 2021-02-28 ENCOUNTER — Other Ambulatory Visit: Payer: Self-pay | Admitting: Geriatric Medicine

## 2021-02-28 DIAGNOSIS — Z1231 Encounter for screening mammogram for malignant neoplasm of breast: Secondary | ICD-10-CM

## 2021-03-19 ENCOUNTER — Other Ambulatory Visit: Payer: Self-pay | Admitting: Cardiology

## 2021-03-22 ENCOUNTER — Ambulatory Visit
Admission: RE | Admit: 2021-03-22 | Discharge: 2021-03-22 | Disposition: A | Discharge status: Home / Self Care | Payer: PPO | Source: Ambulatory Visit | Attending: Geriatric Medicine | Admitting: Geriatric Medicine

## 2021-03-22 ENCOUNTER — Other Ambulatory Visit: Payer: Self-pay

## 2021-03-22 DIAGNOSIS — Z1231 Encounter for screening mammogram for malignant neoplasm of breast: Secondary | ICD-10-CM | POA: Diagnosis not present

## 2021-03-23 DIAGNOSIS — M8580 Other specified disorders of bone density and structure, unspecified site: Secondary | ICD-10-CM | POA: Diagnosis not present

## 2021-03-23 DIAGNOSIS — G301 Alzheimer's disease with late onset: Secondary | ICD-10-CM | POA: Diagnosis not present

## 2021-03-23 DIAGNOSIS — J4530 Mild persistent asthma, uncomplicated: Secondary | ICD-10-CM | POA: Diagnosis not present

## 2021-03-23 DIAGNOSIS — E7800 Pure hypercholesterolemia, unspecified: Secondary | ICD-10-CM | POA: Diagnosis not present

## 2021-03-23 DIAGNOSIS — K219 Gastro-esophageal reflux disease without esophagitis: Secondary | ICD-10-CM | POA: Diagnosis not present

## 2021-03-23 DIAGNOSIS — F0280 Dementia in other diseases classified elsewhere without behavioral disturbance: Secondary | ICD-10-CM | POA: Diagnosis not present

## 2021-03-24 ENCOUNTER — Other Ambulatory Visit: Payer: Self-pay | Admitting: Allergy and Immunology

## 2021-03-24 ENCOUNTER — Other Ambulatory Visit: Payer: Self-pay | Admitting: Cardiology

## 2021-03-24 ENCOUNTER — Other Ambulatory Visit: Payer: Self-pay | Admitting: Neurology

## 2021-04-03 DIAGNOSIS — G301 Alzheimer's disease with late onset: Secondary | ICD-10-CM | POA: Diagnosis not present

## 2021-04-03 DIAGNOSIS — K219 Gastro-esophageal reflux disease without esophagitis: Secondary | ICD-10-CM | POA: Diagnosis not present

## 2021-04-03 DIAGNOSIS — E7800 Pure hypercholesterolemia, unspecified: Secondary | ICD-10-CM | POA: Diagnosis not present

## 2021-04-03 DIAGNOSIS — M8580 Other specified disorders of bone density and structure, unspecified site: Secondary | ICD-10-CM | POA: Diagnosis not present

## 2021-04-03 DIAGNOSIS — F0280 Dementia in other diseases classified elsewhere without behavioral disturbance: Secondary | ICD-10-CM | POA: Diagnosis not present

## 2021-04-03 DIAGNOSIS — J4530 Mild persistent asthma, uncomplicated: Secondary | ICD-10-CM | POA: Diagnosis not present

## 2021-05-09 ENCOUNTER — Ambulatory Visit: Payer: PPO

## 2021-05-21 NOTE — Patient Instructions (Addendum)
Moderate persistent asthma Start prednisone 10 mg taking 2 tablets twice a day for 3 days, then on the fourth day take two tablets in the morning and on the fifth day take 1 tablet and stop We will get a STAT d-dimer since you have have recently had long distance travel. We will call you with results once they are back. If your breathing does not get any better I want you to go to the Emergency Room. Continue Breztri 2 puffs twice a day with spacer to help prevent cough and wheeze. She will call if she needs a prescription sent in for a spacer. May use ProAir 2 puffs every 4-6 hours as needed for cough, wheeze, tightness in chest, or shortness of breath  Asthma control goals:  Full participation in all desired activities (may need albuterol before activity) Albuterol use two time or less a week on average (not counting use with activity) Cough interfering with sleep two time or less a month Oral steroids no more than once a year No hospitalizations  Allergic rhintis Continue fluticasone (Flonase) 2 sprays each nostril once a day as needed for stuffy nose Continue azelastine (Astelin) 1-2 sprays each nostril twice a day as needed for runny nose/drainage down throat  Laryngopharyngeal reflux disease Continue omeprazole 40 mg - 80 mg in AM + 40 mg in PM + famotidine 20 mg in evening as prescribed by GI   Please let us know if this treatment plan is not working well for you. Keep already scheduled follow up appointment with Dr. Lucie Leather on 08/08/21 @9  am

## 2021-05-22 ENCOUNTER — Encounter: Payer: Self-pay | Admitting: Family

## 2021-05-22 ENCOUNTER — Telehealth: Payer: Self-pay | Admitting: Family

## 2021-05-22 ENCOUNTER — Other Ambulatory Visit: Payer: Self-pay

## 2021-05-22 ENCOUNTER — Ambulatory Visit: Payer: PPO | Admitting: Family

## 2021-05-22 ENCOUNTER — Ambulatory Visit: Payer: PPO | Admitting: Neurology

## 2021-05-22 VITALS — BP 132/70 | HR 63 | Temp 97.8°F | Resp 16 | Ht 67.76 in | Wt 116.8 lb

## 2021-05-22 DIAGNOSIS — K219 Gastro-esophageal reflux disease without esophagitis: Secondary | ICD-10-CM

## 2021-05-22 DIAGNOSIS — J3089 Other allergic rhinitis: Secondary | ICD-10-CM

## 2021-05-22 DIAGNOSIS — J4541 Moderate persistent asthma with (acute) exacerbation: Secondary | ICD-10-CM | POA: Diagnosis not present

## 2021-05-22 DIAGNOSIS — B370 Candidal stomatitis: Secondary | ICD-10-CM

## 2021-05-22 DIAGNOSIS — R0602 Shortness of breath: Secondary | ICD-10-CM

## 2021-05-22 MED ORDER — BREZTRI AEROSPHERE 160-9-4.8 MCG/ACT IN AERO: INHALATION_SPRAY | RESPIRATORY_TRACT | 5 refills | Status: DC

## 2021-05-22 NOTE — Telephone Encounter (Signed)
Spoke with the patient to let her know that her d-dimer was in normal limits. Instructed her to let us know if she was not getting any better.   She also wanted to let us know that she found an old spacer and does not need a prescription for one sent in.   Nehemiah Settle, FNP [facility]

## 2021-05-22 NOTE — Progress Notes (Signed)
[address] Presto [place] [address] Dept: [phone]  FOLLOW UP NOTE  Patient ID: Victoria Trevino, female    DOB: 1941/03/01  Age: 80 y.o. MRN: [id number] Date of Office Visit: 05/22/2021  Assessment  Chief Complaint: Asthma (Has been having shortness of breath, coughing, hoariness and has used her rescue inhaler twice this week. )  HPI Victoria Trevino is an 80 year old female who presents today for an acute visit.  She was last seen on Jan 31, 2021 by Dr. Lucie Leather for moderate persistent well-controlled asthma, allergic rhinitis, laryngopharyngeal reflux disease, and oral thrush.  Moderate persistent asthma is reported as not well controlled with Breztri 2 puffs twice a day and albuterol as needed.  She does not use a spacer with her Breztri.  She reports for the past week she has had a cough that is mostly dry but occasionally will be productive with clear mucus, occasional wheezing, tightness in her chest, shortness of breath, and nocturnal awakenings due to breathing problems. She denies any fever or chills. Since her last office visit she has not required any systemic steroids or made any trips to the emergency room or urgent care due to breathing problems.  She has not used her albuterol yet today but she used her albuterol once on Sunday and once on Saturday.  She reports that this did help a little with her breathing symptoms.  She denies any history of blood clots and has not had  recent surgery.  She did mention that she did recently come back from traveling long distance to [place] to be with her sister for total knee surgery.  Allergic rhinitis is reported as not well controlled with Flonase nasal spray once a day and azelastine nasal spray in the morning.  She reports clear rhinorrhea, nasal congestion, and postnasal drip at times.  Laryngopharyngeal reflux disease is reported as controlled with omeprazole 80 mg in the morning and 40 to 80 mg at night plus famotidine 20 mg at  night.  She continues to follow-up with GI.  She reports that she continues to take nystatin every day for thrush and Diflucan every other week for thrush.   Drug Allergies:  Allergies  Allergen Reactions   Valium [Diazepam]     hypersensitivity    Review of Systems: Review of Systems  Constitutional:  Negative for chills and fever.  HENT:         Reports clear rhinorrhea, nasal congestion, and postnasal drip at times  Eyes:        Reports dry eyes  Respiratory:  Positive for cough, shortness of breath and wheezing.   Cardiovascular:  Negative for chest pain and palpitations.  Gastrointestinal:        Denies heartburn and reflux symptoms  Genitourinary:  Negative for frequency.  Skin:  Negative for itching and rash.  Neurological:  Negative for headaches.  Endo/Heme/Allergies:  Positive for environmental allergies.    Physical Exam: BP 132/70   Pulse 63   Temp 97.8 F (36.6 C)   Resp 16   Ht 5' 7.76" (1.721 m)   Wt 116 lb 12.8 oz (53 kg)   SpO2 99%   BMI 17.89 kg/m    Physical Exam Constitutional:      Appearance: Normal appearance.  HENT:     Head: Normocephalic and atraumatic.     Comments: Pharynx normal, eyes normal, ears normal, nose: Bilateral lower turbinates moderately edematous and slightly erythematous with clear drainage noted    Right Ear:  Tympanic membrane, ear canal and external ear normal.     Left Ear: Tympanic membrane, ear canal and external ear normal.     Mouth/Throat:     Mouth: Mucous membranes are moist.     Pharynx: Oropharynx is clear.     Comments: No thrush noted Eyes:     Conjunctiva/sclera: Conjunctivae normal.  Cardiovascular:     Rate and Rhythm: Regular rhythm.     Pulses: Normal pulses.     Heart sounds: Normal heart sounds.  Pulmonary:     Breath sounds: Normal breath sounds.     Comments: Lungs clear to auscultation.  Stopping to catch breath in between talking Musculoskeletal:     Cervical back: Neck supple.  Skin:     General: Skin is warm.  Neurological:     Mental Status: She is alert and oriented to person, place, and time.  Psychiatric:        Mood and Affect: Mood normal.        Behavior: Behavior normal.        Thought Content: Thought content normal.        Judgment: Judgment normal.    Diagnostics: FVC 2.00 L, FEV1 1.61 L.  Predicted FVC 2.98 L, predicted FEV1 2.23 L.  Spirometry indicates mild restriction.  Assessment and Plan: 1. Moderate persistent asthma with (acute) exacerbation   2. Shortness of breath   3. Other allergic rhinitis   4. Thrush, oral   5. LPRD (laryngopharyngeal reflux disease)     Meds ordered this encounter  Medications   Budeson-Glycopyrrol-Formoterol (BREZTRI AEROSPHERE) 160-9-4.8 MCG/ACT AERO    Sig: Inhale 2 puffs twice a day with spacer to help prevent cough and wheeze    Dispense:  10.7 g    Refill:  5     Patient Instructions  Moderate persistent asthma Start prednisone 10 mg taking 2 tablets twice a day for 3 days, then on the fourth day take two tablets in the morning and on the fifth day take 1 tablet and stop We will get a STAT d-dimer since you have have recently had long distance travel. We will call you with results once they are back. If your breathing does not get any better I want you to go to the Emergency Room. Continue Breztri 2 puffs twice a day with spacer to help prevent cough and wheeze. She will call if she needs a prescription sent in for a spacer. May use ProAir 2 puffs every 4-6 hours as needed for cough, wheeze, tightness in chest, or shortness of breath  Asthma control goals:  Full participation in all desired activities (may need albuterol before activity) Albuterol use two time or less a week on average (not counting use with activity) Cough interfering with sleep two time or less a month Oral steroids no more than once a year No hospitalizations  Allergic rhintis Continue fluticasone (Flonase) 2 sprays each nostril once  a day as needed for stuffy nose Continue azelastine (Astelin) 1-2 sprays each nostril twice a day as needed for runny nose/drainage down throat  Laryngopharyngeal reflux disease Continue omeprazole 40 mg - 80 mg in AM + 40 mg in PM + famotidine 20 mg in evening as prescribed by GI   Please let us know if this treatment plan is not working well for you. Keep already scheduled follow up appointment with Dr. Lucie Leather on 08/08/21 @9  am Return in about 3 months (around 08/08/2021), or if symptoms worsen or fail to improve.  Thank you for the opportunity to care for this patient.  Please do not hesitate to contact me with questions.  Althea Charon, FNP [facility]

## 2021-05-23 LAB — D-DIMER, QUANTITATIVE: D-DIMER: 0.47 mg{FEU}/L (ref 0.00–0.49)

## 2021-05-23 NOTE — Progress Notes (Signed)
Spoke witt Ms. Spiers about normal results yesterday. See telephone encounter. Thank you.  Nehemiah Settle, FNP

## 2021-05-25 ENCOUNTER — Encounter: Payer: Self-pay | Admitting: Neurology

## 2021-05-25 ENCOUNTER — Other Ambulatory Visit: Payer: Self-pay

## 2021-05-25 ENCOUNTER — Ambulatory Visit: Payer: PPO | Admitting: Neurology

## 2021-05-25 VITALS — BP 120/70 | HR 62 | Ht 67.0 in | Wt 117.4 lb

## 2021-05-25 DIAGNOSIS — G3184 Mild cognitive impairment, so stated: Secondary | ICD-10-CM | POA: Diagnosis not present

## 2021-05-25 NOTE — Progress Notes (Signed)
[facility]    Provider:  Dr Lucia Gaskins Requesting Provider: Merlene Laughter, MD Primary Care Provider:  Merlene Laughter, MD  CC:  Memory loss  05/25/2021: She has an appointment with Dr. Kieth Brightly next week. She is concerned about memory. She puts her cell phone down and forgets where she put it so she bought a tool belt to put her phone in it. Patient is stable today, I tried to reassure her that people with dementia progress and she does not seem to be progressing.Marland Kitchen  Here independently, she performs all her IADls and ADLs. 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 Her MMSE is stable. She lives right next door to her son who she says is wonderful and she is enjoying life and gardening and loves being outdoors. She loves to read, she watches TV, she loves hallmark, she is active, no depression or anxiety.   HPI:  Victoria Trevino is a 80 y.o. female here as requested by Merlene Laughter, MD for memory loss.  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 Allena Katz in the past, benign essential tremor, memory lost diagnosed with mild cognitive impairment June 2021 at [facility], I reviewed "care everywhere" but any research notes are not available for me to review.  I reviewed Dr. Laverle Hobby notes, she has a history of Alzheimer's disease, she is in a [facility] [employer] and they have told her she has some concerning memory loss, she still independent in all of her activities of daily living, she is under some stress at home as she is caring for a family member who has definite cognitive limitations for dementia and this has been a difficult management problem for her. She has been with [facility] since 2015 participating in drug studies.    Patient is here with son. She has had a lot of work up at [facility]. Her memory is not as good as it once was. They have not said anything to her about the MRIs, the amyloid testing, she doesn't get to know that. She has had extensive testing at [facility]. They have told her she has progressed to the point she has Alzheimer's disease. I discussed going back to [facility] because I do not have access to any of their research results. She is having difficulty with dates. Here with her son. She can compensate. She doesn't miss any of her medications. She uses a calendar. 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 Her son says she has a routine. She doesn't get lost. She performs her own ADLs and IADLs. She lives independently, her son lives close by and checks on her daily. She gardens, she has a Production manager garden. She is worried that she doesn't get prevention. Her mother died at 65. Mother had Alzheimer,  her grandmother was in her 45s with Alzheimers. Still driving, still doing well, more short-term memory. Good insight and judgement. 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 She has only fallen twice that she knows of, mechanical fall and once when she had the flu, she works very hard due to her imbalance (she has neuropathy). She eats very healthy.  Reviewed notes, labs and imaging from outside physicians, which showed:   Vitamin D was 85.2, CMP was normal with BUN 17 and creatinine 0.81, CBC was normal, B12 472, magnesium 2.3, February 05, 2020.   Review of Systems: Patient complains of symptoms per HPI as well as the following symptoms Alzheimer's. Pertinent negatives and positives per HPI. All others negative.   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   Highest education level: Not on file  Occupational History   Not on file  Tobacco Use   Smoking status: Never   Smokeless tobacco: Never  Vaping Use   Vaping Use: Never used  Substance and Sexual Activity   Alcohol use: Yes    Comment: a glass of wine 2-3 times per year   Drug use: Never   Sexual activity: Yes    Birth control/protection: Condom  Other Topics Concern   Not on file  Social History Narrative   Lives alone in a one story home.  Has 2 children.  Retired Charity fundraiser.  Husband passed away in 2016/11/19.        Son lives next to her   Caffeine: NONE since 2000, was previously 1 cup coffee/day from 1969-2000   Social Determinants of Health   Financial Resource Strain: Not on file  Food Insecurity: Not on file  Transportation Needs: Not on file  Physical Activity: Not on file  Stress: Not on file  Social Connections: Not on file  Intimate Partner Violence: Not on file    Family History  Problem Relation Age of Onset   Alzheimer's disease Mother        Deceased, 4   Hypercholesterolemia Mother    Heart attack Father    Congestive Heart Failure Father        Deceased, 38   Emphysema Father        smoker   CAD Father    Benign prostatic hyperplasia Father    Skin cancer Sister    Allergic rhinitis Sister    Hypertension Sister    Hypercholesterolemia Sister    Obesity Sister    Irritable bowel syndrome Sister    Aneurysm Brother    Skin cancer Brother        basal cell   Memory loss Brother    COPD Brother    Congestive Heart Failure Brother    Emphysema Brother    Stroke Maternal Grandmother    Dementia Maternal Grandmother    Prostate cancer Maternal Grandfather    Dementia Maternal Grandfather    Diabetes Son    Hypertension Son    Diabetes Daughter    Asthma Daughter    Stroke Paternal Grandmother    CAD Paternal Grandfather     Past Medical History:  Diagnosis Date   Allergic rhinoconjunctivitis 08/02/2015   Allergies     Arthritis    hands   Asthma    Dr Laurette Schimke   Asthma, well controlled 11/19/17   Blepharitis    GERD (gastroesophageal reflux disease)    Glaucoma    Hereditary and idiopathic peripheral neuropathy 02/16/2016   Hx of fracture    Left wrist 2018; right patella (separate incident) 2018; used braces   Left wrist injury 01/18/2017   LPRD (laryngopharyngeal reflux disease) 08/02/2015   Macular degeneration  Memory loss    in study at [place], [place] 02/2020   Migraine    Moderate persistent asthma 08/02/2015   Osteopenia    Sensorimotor neuropathy    moderate-severe with chronic denervation- Dr Cyndi Lennert, oral 10/31/2017   Vertigo    occasional positional    Patient Active Problem List   Diagnosis Date Noted   MCI (mild cognitive impairment) 01/12/2021   Asthma, well controlled 10/31/2017   Thrush, oral 10/31/2017   Left wrist injury 01/18/2017   Hereditary and idiopathic peripheral neuropathy 02/16/2016   LPRD (laryngopharyngeal reflux disease) 08/02/2015   Allergic rhinoconjunctivitis 08/02/2015   Moderate persistent asthma 08/02/2015    Past Surgical History:  Procedure Laterality Date   BUNIONECTOMY Right    Right great toe and little toe, pins in both, [place], [place]   CATARACT EXTRACTION, BILATERAL     with implants; R eye 08/2017 and L eye 09/2017   Cervical decompression and fusion  1997   COLONOSCOPY     2003, 2008, 2013   ECTOPIC PREGNANCY SURGERY     injection laryngeal plasty 2008     per notes from [facility]   KNEE SURGERY Left    meniscus @ [employer], [place], [place]   nissan infundibulm     SALPINGECTOMY Right    [place], [place]   THROAT SURGERY     vocal cords injected with liquid bone  2005   [place], [place]    Current Outpatient Medications  Medication Sig Dispense Refill   Acetaminophen (TYLENOL PO) Take 325 mg by mouth at bedtime as needed.     albuterol (PROVENTIL HFA) 108 (90 Base) MCG/ACT inhaler Inhale 2  puffs into the lungs every 6 (six) hours as needed for wheezing or shortness of breath. 54 g 1   azelastine (ASTELIN) 0.1 % nasal spray Place 2 sprays into both nostrils 2 (two) times daily. 30 mL 5   bimatoprost (LUMIGAN) 0.01 % SOLN Place 1 drop into both eyes at bedtime.     Budeson-Glycopyrrol-Formoterol (BREZTRI AEROSPHERE) 160-9-4.8 MCG/ACT AERO Inhale two puffs twice a day 10.7 g 5   Budeson-Glycopyrrol-Formoterol (BREZTRI AEROSPHERE) 160-9-4.8 MCG/ACT AERO Inhale 2 puffs twice a day with spacer to help prevent cough and wheeze 10.7 g 5   Calcium Carbonate-Vitamin D 600-200 MG-UNIT TABS Take 1 tablet by mouth daily.     Cholecalciferol (VITAMIN D-3) 1000 UNITS CAPS Take 2,000 Units by mouth daily.     donepezil (ARICEPT) 10 MG tablet TAKE ONE TABLET BY MOUTH EVERYDAY AT BEDTIME 30 tablet 3   famotidine (PEPCID) 20 MG tablet Take 1 tablet (20 mg total) by mouth at bedtime. 30 tablet 5   fluconazole (DIFLUCAN) 100 MG tablet TAKE ONE TABLET BY MOUTH EVERY 14 DAYS 10 tablet 5   fluticasone (FLONASE) 50 MCG/ACT nasal spray USE ONE SPRAY IN EACH NOSTRIL ONCE DAILY 48 g 1   nystatin (MYCOSTATIN) 100000 UNIT/ML suspension SWISH AND swallow one teasppon (47ml) BY MOUTH EVERY MORNING 480 mL 1   omega-3 acid ethyl esters (LOVAZA) 1 g capsule Take 2 g by mouth daily.     omeprazole (PRILOSEC) 40 MG capsule Take 1 capsule (40 mg total) by mouth in the morning and at bedtime. Take 2 pills in the morning and 2 pills at bedtime 120 capsule 4   OVER THE COUNTER MEDICATION 1 each 2 (two) times daily. AREDS 2     Polyethyl Glycol-Propyl Glycol (SYSTANE OP) Apply 1 drop to eye 2 (two) times  daily.     rosuvastatin (CRESTOR) 5 MG tablet TAKE ONE TABLET BY MOUTH ONCE DAILY 90 tablet 2   SIMPLY SALINE NA Place into the nose 2 (two) times daily.     Budeson-Glycopyrrol-Formoterol (BREZTRI AEROSPHERE) 160-9-4.8 MCG/ACT AERO Inhale 2 puffs into the lungs in the morning and at bedtime. 10.7 g 5    Carboxymethylcellulose Sodium (ARTIFICIAL TEARS OP) Place 1 drop into both eyes. 2-3 times per day.     predniSONE (DELTASONE) 10 MG tablet Take 1 tablet (10 mg total) by mouth daily with breakfast. (Patient not taking: No sig reported) 30 tablet 0   No current facility-administered medications for this visit.    Allergies as of 05/25/2021 - Review Complete 05/25/2021  Allergen Reaction Noted   Valium [diazepam]  11/25/2019    Vitals: BP 120/70   Pulse 62   Ht 5\' 7"  (1.702 m)   Wt 117 lb 6.4 oz (53.3 kg)   BMI 18.39 kg/m  Last Weight:  Wt Readings from Last 1 Encounters:  05/25/21 117 lb 6.4 oz (53.3 kg)   Last Height:   Ht Readings from Last 1 Encounters:  05/25/21 5\' 7"  (1.702 m)     Physical exam: Exam: Gen: NAD, conversant, well nourised, thin, well groomed                     CV: RRR, no MRG. No Carotid Bruits. No peripheral edema, warm, nontender Eyes: Conjunctivae clear without exudates or hemorrhage  Neuro: Detailed Neurologic Exam  Speech:    Speech is normal; fluent and spontaneous with normal comprehension.  Cognition:  MMSE - Mini Mental State Exam 05/25/2021 01/11/2021  Orientation to time 4 5  Orientation to Place 5 5  Registration 3 3  Attention/ Calculation 4 5  Recall 3 2  Language- name 2 objects 2 2  Language- repeat 1 1  Language- follow 3 step command 3 3  Language- read & follow direction 1 1  Write a sentence 1 1  Copy design 1 1  Total score 28 29       The patient is oriented to person, place, and time;     recent and remote memory intact;     language fluent;     normal attention, concentration,     fund of knowledge Cranial Nerves:    The pupils are equal, round, and reactive to light. Pupils too small to visualize fundi.  Visual fields are full to finger confrontation. Extraocular movements are intact. Trigeminal sensation is intact and the muscles of mastication are normal. The face is symmetric. The palate elevates in the  midline. Hearing intact. Voice is normal. Shoulder shrug is normal. The tongue has normal motion without fasciculations.   Coordination:    Normal finger to nose   Gait:    Heel-toe and tandem gait with imbalance (neuropathy)   Motor Observation:    No asymmetry, no atrophy, slight postural and intention tremor, high arches, thin ankles Tone:    Normal muscle tone.    Posture:    Posture is normal. normal erect    Strength:    Strength is V/V in the upper and lower limbs.      Sensation: decreased to all modalities distally (diagnosed peripheal neuropathy and evaluated int he past by neuromuscular expert Dr. 05/27/2021 at [facility])     Reflex Exam:  DTR's:    Absent AJs. Otherwise Deep tendon reflexes in the upper and lower extremities are normal bilaterally.  Toes:    The toes are downgoing bilaterally.   Clonus:    Clonus is absent.    Assessment/Plan:   Extremely lovely 80 y.o. female here as requested by Merlene Laughter, MD for memory loss.  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 Allena Katz in the past, benign essential tremor, memory loss.Extensive FHx on both sides of alzheimers.The [facility] team has told her to seek care because they are concerned with her progression.  MMSE is stable. 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 She sees Dr. Kieth Brightly next week and I think she will do extremely well. She has etensive FHx of dementia and worries but I tried to reassure her and let her know how wonderful Dr. Kieth Brightly.   Addendum: 5/12: I spoke to Adelina Mings who is the Teacher, [employer] [facility] [phone].  Patient had a amyloid PET scan which showed positive amyloid frontal lateral medial October 2021.  She did not have any information on MRI of the  brain but she did have formal memory testing in January 2022 which showed mild cognitive impairment.  However due to research protocol I do not think I can be provided with the scans on CD or the full reports, patient can bring the results that she received in the mail which are basically the above.  Much appreciate our colleagues at [facility].  Patient did say when she last had an appointment she was told its not mild cognitive impairment but that it is Alzheimer's, I do think at this time we should proceed with formal neurocognitive testing so that we have our own baseline and patient can continue to follow with [place] instead of [facility].I spoke to patient and explained the above. She was relieved they told me MCI and I explained MCi does not have to progress to Alzheimers. Lovely patient.  - She has been involved with the Healthy Brain Study since 2015 and had extensive testing. Since that is a research protocol I do not have access to all the testing and I discussed this with her and son, she could go to [facility] and see if they can release more information. I will try and call and see what they can release to me. I left a message for [facility] [phone]  - She has excellent judgement and insight and she is performing all her own ADLs and IADLs. She is just lovely. Possibly the [facility] team was concerned for memory loss progression and risk of progresison to Alzheimers because at this time I do not appreciate a major neurocognitive disorder diagnosis.   - She has had MRIs, amyloid scans, lab testing. None further.   - "The XX Brain" Chana Bode  - Start Aricept 5mg . In 2-3 weeks if no side effects start taking 2 pills once daily. And then let me know and I can prescribe a 10mg  pill.If you have nightmares, change it ot the morning. Memantine is another medication we can add on - Dr. formal neurocognitive testing  - Follow up 4 months  No orders  of the defined types were placed in this encounter.  No orders of the defined types were placed in this encounter.   Cc: , MD,  Kieth Brightly, MD  Merlene Laughter, MD  [facility] [address] [place], [place] [address]  Phone [phone] Fax [phone]  I spent 25 minutes of face-to-face and non-face-to-face time with patient on the  1. MCI (mild cognitive impairment)     diagnosis.  This included previsit chart review, lab review, study review, order entry, electronic health record documentation, patient education on the different diagnostic and therapeutic options, counseling and coordination of care, risks and benefits of management, compliance, or risk factor reduction

## 2021-06-01 ENCOUNTER — Other Ambulatory Visit: Payer: Self-pay

## 2021-06-01 ENCOUNTER — Encounter: Payer: PPO | Attending: Psychology | Admitting: Psychology

## 2021-06-01 DIAGNOSIS — G3184 Mild cognitive impairment, so stated: Secondary | ICD-10-CM | POA: Insufficient documentation

## 2021-06-01 NOTE — Progress Notes (Signed)
Neuropsychological Consultation   Patient:   Victoria Trevino   DOB:   August 12, 1941  MR Number:  [id number]  Location:  [facility] FOR PAIN AND [facility] MEDICINE [facility] PHYSICAL MEDICINE AND REHABILITATION [address] [place], [address] [id number] [facility] [place] [address] Dept: [phone]           Date of Service:   06/01/2021  Start Time:   3 PM End Time:   5 PM  Today's visit was an in person visit was conducted in outpatient clinic office with the patient myself present.  1 hour and 15 minutes was spent in formal face-to-face clinical interview and the other 45 minutes was spent in records review, report writing and setting up testing protocols.      Provider/Observer:  Arley Phenix, Psy.D.       Clinical Neuropsychologist       Billing Code/Service: 96116/96121  Chief Complaint:    MARCIA Trevino is an 80 year old female referred for neuropsychological evaluation by Naomie Dean, MD as part of a larger neurological work-up due to the patient's concerns around memory changes and memory loss.  The patient was initially referred for neurological work-up by her PCP Merlene Laughter, MD.  The patient recently had a neurological visit and completed a Mini-Mental state exam where she scored 28 out of 30 with one-point lost in the orientation questions to time and one-point lost on measures of attention.  The patient showed no progressive decline in MMSE.  The patient does report she has concerns about memory.  She has been having difficulty keeping up with herself on and continues to perform all of her IDAIs and ADLs.  The patient began to work with the Alzheimer's research study through [facility] in 2016 as part of the normative studies not because she was having any difficulties at the time.  She reports that while they have not provided her with any of the findings of the study including MRI, amyloid testing or other studies she has had extensive cognitive testing as well.   We have no access to these results.  Reason for Service:  Victoria Trevino is an 80 year old female referred for neuropsychological evaluation by Naomie Dean, MD as part of a larger neurological work-up due to the patient's concerns around memory changes and memory loss.  The patient was initially referred for neurological work-up by her PCP Merlene Laughter, MD.  The patient recently had a neurological visit and completed a Mini-Mental state exam where she scored 28 out of 30 with one-point lost in the orientation questions to time and one-point lost on measures of attention.  The patient showed no progressive decline in MMSE.  The patient does report she has concerns about memory.  She has been having difficulty keeping up with herself on and continues to perform all of her IDAIs and ADLs.  The patient began to work with the Alzheimer's research study through [facility] in 2015 as part of the normative studies not because she was having any difficulties at the time.  She reports that while they have not provided her with any of the findings of the study including MRI, amyloid testing or other studies she has had extensive cognitive testing as well.  We have no access to these results.  The patient goes into greater detail regarding some of her difficulties that she has noted.  The patient reports that she is having more difficulty remembering the names of acquaintances and that while she remember first  faces she cannot remember the names as efficiently as in the past.  She has difficulty keeping up with her cell phone and difficulty remembering telephone numbers as easily as she did before.  She reports that cooking her meals is more difficult now as she has some difficulty keeping up with exact measurements of recipes and keeping up with ingredients etc.  She also reports difficulties keeping up with eyeglasses, her purse or the books she is currently reading.  She reports that word choice and spelling are  getting more difficult and she reports that occasionally she will have difficulty coming up with people's names but if cued she will have it accurately.  The patient reports that she started noticing memory changes over the past couple of years.  She reports that she has difficulty remembering names or remembering where she put things.  She is developing strategies to adjust to these recall issues.  Patient reports that she started participating in research study through [facility] in 2015 and she realized that she is not doing as well in testing as she had been in the past.  It does not appear that [facility] has provided her with any diagnostic considerations but she does report that [facility] did say that she had some "mild cognitive impairment but no other details were supplied."  The patient does have a family history of her mother and grandmother being diagnosed with Alzheimer's.  However, her mother was 40 years old when she died and the grandmother lived to 1.  Both of which had significant cognitive and memory difficulties at the end of their life and the patient was told it was due to Alzheimer's but we do not have any evidence of formal testing or differential diagnostic considerations.  The patient's grandfather died of prostate cancer when he was younger.  The patient has been diagnosed with essential tremor several years ago by a different neurologist and reports that she does have some tremor in both right and left hand although right is often more significant than left but they are still rather mild and manageable.  The patient denies any visual hallucinations or auditory hallucinations and reports that while she has some trouble sleeping through the night and will wake up in the middle of the night often and have trouble going back to sleep she will also have leg cramps at night which can make it difficult to sleep.  The patient reports that sometimes she has "awful dreams."  When she  wakes up at 6 AM in the morning she does not always feel well rested.  The patient reports that she does get regular physical activity although last very recently.  She has seen a trainer to work on physical activity and maintain leg strength.  The patient reports that her daughter has told the patient that the daughter is worried that the patient has some type of issues since there has been cognitive conditions on both sides of the family that the daughter is familiar with.  However, the daughter lives in [place] and does not have day-to-day direct interaction with the patient.  The patient's son, who lives directly behind the patient's house denies any concerns or worries about the patient and the visits or phones the patient for 5 times during the week.  The patient is still driving and taking care of her own medications, meals, laundry, bills, housekeeping and working in the yard.  The patient reports that over the past year that she has lost  confidence and counting on her memory for appointments and activities.  She depends on a calendar where she writes down all of her activities with time and place.  The patient reports that she is always kept a calendar but now is very dependent upon it.  She notes that sometimes she repeats herself when she is talking to her family.  The patient reports that her hand tremors are little worse.  Patient reports that difficulty breathing is more frequent than in the past limiting how much yard work and gardening she can do.  The patient reports that she will spend 1 to 2 hours instead of all morning in the garden like she did the previous year.  Patient reports that she intersperse his reading for a little while between doing various household chores.  She is still driving locally.  Behavioral Observation: VERNIE VINCIGUERRA  presents as a 80 y.o.-year-old Right handed Caucasian Female who appeared her stated age. her dress was Appropriate and she was Well Groomed and her  manners were Appropriate to the situation.  her participation was indicative of Appropriate and Attentive behaviors.  There were not physical disabilities noted.  she displayed an appropriate level of cooperation and motivation.     Interactions:    Active Appropriate  Attention:   within normal limits and attention span and concentration were age appropriate  Memory:   within normal limits; recent and remote memory intact  Visuo-spatial:  not examined  Speech (Volume):  normal  Speech:   normal; normal  Thought Process:  Coherent and Relevant  Though Content:  WNL; not suicidal and not homicidal  Orientation:   person, place, time/date, and situation  Judgment:   Good  Planning:   Good  Affect:    Appropriate  Mood:    Euthymic  Insight:   Good  Intelligence:   high  Marital Status/Living: The patient was born and raised in [place] along with 2 siblings.  No developmental milestone issues were noted and no significant medical issues other than chickenpox and measles noted.  The patient's husband of 55 years passed away 4-1/2 years ago which has had some impact on her emotionally with his loss.  The patient has 2 adult children she has a 50 year old son who lives very close by and a 36 year old daughter who lives out of state.  No neurological issues are noted with her son or daughter with the exception of the daughter having some difficulties with depression and anxiety associated with her Hashimoto's disease that was diagnosed when she was 80 years old.  Current Employment: The patient is retired.  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  The patient's hobbies and interest have included reading, gardening, sewing, computer games, playing the piano.  The patient reports that she used to enjoy visiting people in nursing homes before COVID but this was discontinued as well as change in  frequency and how her participating in church.  Substance Use:  No concerns of substance abuse are reported.    Education:   The patient graduated from high school as well as completing a 3-year nursing degree program and always maintained a very good GPA throughout.  Patient reports that her best subject in school was math and science and had some relative difficulty in history.  The patient never repeated any grades and was usually one of the top students and in fact graduated valedictorian  of a class of 114.  She graduated the top 10% of her nursing class.  Medical History:   Past Medical History:  Diagnosis Date   Allergic rhinoconjunctivitis 08/02/2015   Allergies    Arthritis    hands   Asthma    Dr Laurette Schimke   Asthma, well controlled 10/31/2017   Blepharitis    GERD (gastroesophageal reflux disease)    Glaucoma    Hereditary and idiopathic peripheral neuropathy 02/16/2016   Hx of fracture    Left wrist 2018; right patella (separate incident) 2018; used braces   Left wrist injury 01/18/2017   LPRD (laryngopharyngeal reflux disease) 08/02/2015   Macular degeneration    Memory loss    in study at [place], [place] 02/2020   Migraine    Moderate persistent asthma 08/02/2015   Osteopenia    Sensorimotor neuropathy    moderate-severe with chronic denervation- Dr Cyndi Lennert, oral 10/31/2017   Vertigo    occasional positional         Patient Active Problem List   Diagnosis Date Noted   MCI (mild cognitive impairment) 01/12/2021   Asthma, well controlled 10/31/2017   Thrush, oral 10/31/2017   Left wrist injury 01/18/2017   Hereditary and idiopathic peripheral neuropathy 02/16/2016   LPRD (laryngopharyngeal reflux disease) 08/02/2015   Allergic rhinoconjunctivitis 08/02/2015   Moderate persistent asthma 08/02/2015              Abuse/Trauma History: The patient did not report any history of abuse or trauma.  Psychiatric History:  The patient did have some bereavement  coping issues with the passing of her husband of 55 years but no significant depression or anxiety noted.  The patient dealt with irritable bowel syndrome in the past and did see a counselor to help with stress management around those issues but was not a significant psychiatric.  Family Med/Psych History:  Family History  Problem Relation Age of Onset   Alzheimer's disease Mother        Deceased, 78   Hypercholesterolemia Mother    Heart attack Father    Congestive Heart Failure Father        Deceased, 37   Emphysema Father        smoker   CAD Father    Benign prostatic hyperplasia Father    Skin cancer Sister    Allergic rhinitis Sister    Hypertension Sister    Hypercholesterolemia Sister    Obesity Sister    Irritable bowel syndrome Sister    Aneurysm Brother    Skin cancer Brother        basal cell   Memory loss Brother    COPD Brother    Congestive Heart Failure Brother    Emphysema Brother    Stroke Maternal Grandmother    Dementia Maternal Grandmother    Prostate cancer Maternal Grandfather    Dementia Maternal Grandfather    Diabetes Son    Hypertension Son    Diabetes Daughter    Asthma Daughter    Stroke Paternal Grandmother    CAD Paternal Grandfather     Risk of Suicide/Violence: virtually non-existent   Impression/DX:  DANIELL PARADISE is an 80 year old female referred for neuropsychological evaluation by Naomie Dean, MD as part of a larger neurological work-up due to the patient's concerns around memory changes and memory loss.  The patient was initially referred for neurological work-up by her PCP Merlene Laughter, MD.  The patient recently had a neurological visit and  completed a Mini-Mental state exam where she scored 28 out of 30 with one-point lost in the orientation questions to time and one-point lost on measures of attention.  The patient showed no progressive decline in MMSE.  The patient does report she has concerns about memory.  She has been having  difficulty keeping up with herself on and continues to perform all of her IDAIs and ADLs.  The patient began to work with the Alzheimer's research study through [facility] in 2016 as part of the normative studies not because she was having any difficulties at the time.  She reports that while they have not provided her with any of the findings of the study including MRI, amyloid testing or other studies she has had extensive cognitive testing as well.  We have no access to these results.  Disposition/Plan:  We have set the patient up for formal neuropsychological testing and will start out with a foundational battery of the controlled oral Word Association test, the Wechsler Adult Intelligence Scale and Wechsler Memory Scale's.  This will provide a foundational battery looking at a broad range of intellectual and cognitive domains in a highly structured and well normed format.  This will also allow for repeat testing in the future if need be.  Once this is completed a determination will be made as to the potential need for any other testing to provide needed assessment for diagnostic and treatment recommendations.  Once this is completed a formal report will be produced and made available to her referring physician and also made available to her EMR where her other treating doctors would have access to it if appropriate.  I will then sit down with the patient and go over the results with any recommendations.  Diagnosis:    MCI (mild cognitive impairment)         Electronically Signed   _______________________ Arley Phenix, Psy.D. Clinical Neuropsychologist

## 2021-06-08 ENCOUNTER — Other Ambulatory Visit: Payer: Self-pay

## 2021-06-08 ENCOUNTER — Encounter: Payer: PPO | Attending: Psychology

## 2021-06-08 DIAGNOSIS — G3184 Mild cognitive impairment, so stated: Secondary | ICD-10-CM | POA: Insufficient documentation

## 2021-06-08 NOTE — Progress Notes (Signed)
Behavioral Observation The patient appeared well-groomed and appropriately dressed. Her manners were polite and appropriate to the situation. The patient demonstrated a positive attitude toward testing and gave good effort. She became frustrated and upset at times but continued to comply with testing and give good effort.   Neuropsychology Note  Victoria Trevino completed 180 minutes of neuropsychological testing with technician, Marica Otter, BA, under the supervision of Arley Phenix, PsyD., Clinical Neuropsychologist. The patient did not appear overtly distressed by the testing session, per behavioral observation or via self-report to the technician. Rest breaks were offered.   Clinical Decision Making: In considering the patient's current level of functioning, level of presumed impairment, nature of symptoms, emotional and behavioral responses during clinical interview, level of literacy, and observed level of motivation/effort, a battery of tests was selected by Dr. Kieth Brightly during initial consultation on 06/01/2021. This was communicated to the technician. Communication between the neuropsychologist and technician was ongoing throughout the testing session and changes were made as deemed necessary based on patient performance on testing, technician observations and additional pertinent factors such as those listed above.  Tests Administered: Controlled Oral Word Association Test (COWAT; FAS & Animals)  Wechsler Adult Intelligence Scale, 4th Edition (WAIS-IV) Wechsler Memory Scale, 4th Edition (WMS-IV); Older Adult Battery   Results:   WAIS-IV  Composite Score Summary  Scale Sum of Scaled Scores Composite Score Percentile Rank 95% Conf. Interval Qualitative Description  Verbal Comprehension 38 VCI 114 82 108-119 High Average  Perceptual Reasoning 47 PRI 133 99 125-138 Very Superior  Working Memory 22 WMI 105 63 98-111 Average  Processing Speed 25 PSI 114 82 104-121 High Average   Full Scale 132 FSIQ 122 93 117-126 Superior  General Ability 85 GAI 127 96 121-131 Superior      Verbal Comprehension Subtests Summary  Subtest Raw Score Scaled Score Percentile Rank Reference Group Scaled Score SEM  Similarities 28 14 91 11 0.90  Vocabulary 45 13 84 13 0.73  Information 15 11 63 11 0.73  (Comprehension) 31 16 98 14 1.08       Perceptual Reasoning Subtests Summary  Subtest Raw Score Scaled Score Percentile Rank Reference Group Scaled Score SEM  Block Design 42 15 95 9 1.34  Matrix Reasoning 18 15 95 9 1.12  Visual Puzzles 16 17 99 10 1.27  (Picture Completion) 8 10 50 6 1.24       Working Librarian, academic Raw Score Scaled Score Percentile Rank Reference Group Scaled Score SEM  Digit Span 22 10 50 7 0.85  Arithmetic 14 12 75 10 0.99       Processing Speed Subtests Summary  Subtest Raw Score Scaled Score Percentile Rank Reference Group Scaled Score SEM  Symbol Search 23 12 75 6 1.12  Coding 54 13 84 7 1.12      WMS-IV-Older Adult  Index Score Summary  Index Sum of Scaled Scores Index Score Percentile Rank 95% Confidence Interval Qualitative Descriptor  Auditory Memory (AMI) 33 90 25 84-97 Average  Visual Memory (VMI) 20 100 50 95-105 Average  Immediate Memory (IMI) 31 102 55 96-108 Average  Delayed Memory (DMI) 22 83 13 77-92 Low Average      Primary Subtest Scaled Score Summary  Subtest Domain Raw Score Scaled Score Percentile Rank  Logical Memory I AM 22 8 25   Logical Memory II AM 1 4 2   Verbal Paired Associates I AM 17 10 50  Verbal Paired Associates II AM 6 11 63  Visual Reproduction I VM 32 13 84  Visual Reproduction II VM 5 7 16   Symbol Span VWM 14 11 63      Auditory Memory Process Score Summary  Process Score Raw Score Scaled Score Percentile Rank Cumulative Percentage (Base Rate)  LM II Recognition 17 - - 26-50%  VPA II Recognition 28 - - 51-75%       Visual Memory Process Score Summary   Process Score Raw Score Scaled Score Percentile Rank Cumulative Percentage (Base Rate)  VR II Recognition 6 - - >75%     ABILITY-MEMORY ANALYSIS  Ability Score:  GAI: 127 Date of Testing:  WAIS-IV; WMS-IV 2021/06/08  Predicted Difference Method   Index Predicted WMS-IV Index Score Actual WMS-IV Index Score Difference Critical Value  Significant Difference Y/N Base Rate  Auditory Memory 114 90 24 9.33 Y 3%  Visual Memory 116 100 16 7.72 Y 10%  Immediate Memory 118 102 16 10.41 Y 5-10%  Delayed Memory 116 83 33 10.86 Y <1%  Statistical significance (critical value) at the .01 level.       COWAT FAS Total = 41 Z = 0.357 Animals Total = 26 Z = 2.26  Feedback to Patient: Victoria Trevino will return on 09/06/2021 for an interactive feedback session with Dr. 11/04/2021 at which time her test performances, clinical impressions and treatment recommendations will be reviewed in detail. The patient understands she can contact our office should she require our assistance before this time.  180 minutes spent face-to-face with patient administering standardized tests, 30 minutes spent scoring Kieth Brightly). [CPT Radiographer, therapeutic, [id number]]  Full report to follow.

## 2021-06-15 ENCOUNTER — Other Ambulatory Visit: Payer: Self-pay | Admitting: Neurology

## 2021-06-20 DIAGNOSIS — M8580 Other specified disorders of bone density and structure, unspecified site: Secondary | ICD-10-CM | POA: Diagnosis not present

## 2021-06-20 DIAGNOSIS — E7800 Pure hypercholesterolemia, unspecified: Secondary | ICD-10-CM | POA: Diagnosis not present

## 2021-06-20 DIAGNOSIS — J4530 Mild persistent asthma, uncomplicated: Secondary | ICD-10-CM | POA: Diagnosis not present

## 2021-06-20 DIAGNOSIS — K219 Gastro-esophageal reflux disease without esophagitis: Secondary | ICD-10-CM | POA: Diagnosis not present

## 2021-06-20 DIAGNOSIS — F0280 Dementia in other diseases classified elsewhere without behavioral disturbance: Secondary | ICD-10-CM | POA: Diagnosis not present

## 2021-06-20 DIAGNOSIS — G301 Alzheimer's disease with late onset: Secondary | ICD-10-CM | POA: Diagnosis not present

## 2021-06-21 ENCOUNTER — Encounter (HOSPITAL_BASED_OUTPATIENT_CLINIC_OR_DEPARTMENT_OTHER): Payer: Self-pay | Admitting: Family

## 2021-06-21 ENCOUNTER — Ambulatory Visit (HOSPITAL_BASED_OUTPATIENT_CLINIC_OR_DEPARTMENT_OTHER): Payer: PPO | Admitting: Family

## 2021-06-21 ENCOUNTER — Ambulatory Visit: Payer: PPO | Admitting: Cardiology

## 2021-06-21 ENCOUNTER — Other Ambulatory Visit: Payer: Self-pay

## 2021-06-21 VITALS — BP 110/68 | HR 60 | Ht 67.5 in | Wt 118.4 lb

## 2021-06-21 DIAGNOSIS — E785 Hyperlipidemia, unspecified: Secondary | ICD-10-CM

## 2021-06-21 DIAGNOSIS — I2510 Atherosclerotic heart disease of native coronary artery without angina pectoris: Secondary | ICD-10-CM | POA: Diagnosis not present

## 2021-06-21 HISTORY — DX: Atherosclerotic heart disease of native coronary artery without angina pectoris: I25.10

## 2021-06-21 NOTE — Progress Notes (Signed)
Office Visit    Patient Name: Victoria Trevino Date of Encounter: 06/21/2021  PCP:  Merlene Laughter, MD   [facility]  Cardiologist:  Donato Schultz, MD  Advanced Practice Provider:  No care team member to display Electrophysiologist:  None      Chief Complaint    Victoria Trevino is a 80 y.o. female with a hx of hyperlipidemia, asthma, GERD, CADpresents today for follow up of hyperlipidemia   Past Medical History    Past Medical History:  Diagnosis Date   Allergic rhinoconjunctivitis 08/02/2015   Allergies    Arthritis    hands   Asthma    Dr Laurette Schimke   Asthma, well controlled 10/31/2017   Blepharitis    GERD (gastroesophageal reflux disease)    Glaucoma    Hereditary and idiopathic peripheral neuropathy 02/16/2016   Hx of fracture    Left wrist 2018; right patella (separate incident) 2018; used braces   Left wrist injury 01/18/2017   LPRD (laryngopharyngeal reflux disease) 08/02/2015   Macular degeneration    Memory loss    in study at [place], [place] 02/2020   Migraine    Moderate persistent asthma 08/02/2015   Osteopenia    Sensorimotor neuropathy    moderate-severe with chronic denervation- Dr Cyndi Lennert, oral 10/31/2017   Vertigo    occasional positional   Past Surgical History:  Procedure Laterality Date   BUNIONECTOMY Right    Right great toe and little toe, pins in both, [place], [place]   CATARACT EXTRACTION, BILATERAL     with implants; R eye 08/2017 and L eye 09/2017   Cervical decompression and fusion  1997   COLONOSCOPY     2003, 2008, 2013   ECTOPIC PREGNANCY SURGERY     injection laryngeal plasty 2008     per notes from [facility]   KNEE SURGERY Left    meniscus @ [employer], [place], [place]   nissan infundibulm     SALPINGECTOMY Right    [place], [place]   THROAT SURGERY     vocal cords injected with liquid bone  2005   [place] [place], [place]    Allergies  Allergies  Allergen Reactions   Valium  [Diazepam]     hypersensitivity    History of Present Illness    Victoria Trevino is a 80 y.o. female with a hx of hyperlipidemia, asthma, GERD, CAD last seen 04/28/20.  Prior cardiac CTA 02/2019 with calcium score of 74 indicating mild nonobstructive disease. There was 0-25% stenosis of the LAD, prox RCA 0-25%, mid-distal RCA 25-50% stenosis. Echo 02/2019 normal LVEF 55-60%, moderate aortic sclerosis without stenosis.  Last seen 04/28/20 doing well from cardiac perspective and staying active in her garden as well as with a trainer.   She presents today for follow up. Shares with me that she lost her brother in February and offered my condolences. Recently stayed with her sister for a few weeks to help her recuperate from knee surgery. She continues to stay very active in her garden. Reports no shortness of breath and stable dyspnea on exertion. Reports no chest pain, pressure, or tightness. No edema, orthopnea, PND. Reports no palpitations.  Does note over the last two days she has noticed a sore spot under her left arm. Wonders whether her recent weed pulling is contributory. We discussed using a heat pack.   EKGs/Labs/Other Studies Reviewed:   The following studies were reviewed today:  Echo 02/16/19  IMPRESSIONS   1. The left ventricle has normal systolic function, with an ejection  fraction of 55-60%. The cavity size was normal. Left ventricular diastolic  parameters were normal.   2. Normal GLS -19.6.   3. The right ventricle has normal systolic function. The cavity was  normal. There is no increase in right ventricular wall thickness.   4. The mitral valve is degenerative. Moderate thickening of the mitral  valve leaflet. Mild calcification of the mitral valve leaflet.   5. The aortic valve is tricuspid. Moderate thickening of the aortic  valve. Sclerosis without any evidence of stenosis of the aortic valve.  Aortic valve regurgitation is mild by color flow Doppler.   Cardiac CTA  02/2019 FINDINGS: A 120 kV prospective scan was triggered in the descending thoracic aorta at 111 HU's. Axial non-contrast 3 mm slices were carried out through the heart. The data set was analyzed on a dedicated work station and scored using the Agatson method. Gantry rotation speed was 250 msecs and collimation was .6 mm. 100 mg of PO metoprolol and 0.8 mg of sl NTG was given. The 3D data set was reconstructed in 5% intervals of the 67-82 % of the R-R cycle. Diastolic phases were analyzed on a dedicated work station using MPR, MIP and VRT modes. The patient received 80 cc of contrast.   Aorta: Normal size. Trivial atherosclerotic plaque. No dissection.   Aortic Valve:  Trileaflet.  No calcifications.   Coronary Arteries:  Normal coronary origin.  Right dominance.   RCA is a large dominant artery that gives rise to PDA and PLVB. There is minimal calcified plaque in the proximal RCA with stenosis 0-25%. Mid and distal RCA has a mild non-calcified plaque with stenosis 25-50%.   PDA is a very small.   PLA has no obvious plaque.   Left main is a large and long artery that gives rise to LAD and LCX arteries. Left main has no plaque.   LAD is a large vessel that gives rise to two small diagonal arteries, wraps around the apex where is supplies part of PDA territory. There is minimal diffuse plaque with maximum stenosis 0-25%.   LCX is a non-dominant artery that gives rise to one large OM1 branch. There is minimal calcified plaque with stenosis 0-25%.   Other findings:   Normal pulmonary vein drainage into the left atrium.   Normal let atrial appendage without a thrombus.   Normal size of the pulmonary artery.   IMPRESSION: 1. Coronary calcium score of 74. This was 48 percentile for age and sex matched control.   2. Normal coronary origin with right dominance.   3. Mild non-obstructive CAD. Risk factor modification is recommended.  EKG:  EKG is  ordered today.  The ekg  ordered today demonstrates NSR 60 bpm with no acute ST/T wave change.   Recent Labs: No results found for requested labs within last 8760 hours.  Recent Lipid Panel    Component Value Date/Time   CHOL 129 05/28/2019 1100   TRIG 71 05/28/2019 1100   HDL 76 05/28/2019 1100   CHOLHDL 1.7 05/28/2019 1100   LDLCALC 39 05/28/2019 1100   Home Medications   Current Meds  Medication Sig   Acetaminophen (TYLENOL PO) Take 325 mg by mouth at bedtime as needed.   albuterol (PROVENTIL HFA) 108 (90 Base) MCG/ACT inhaler Inhale 2 puffs into the lungs every 6 (six) hours as needed for wheezing or shortness of breath.   aspirin EC 81  MG tablet Take 81 mg by mouth daily. Swallow whole.   azelastine (ASTELIN) 0.1 % nasal spray Place 2 sprays into both nostrils 2 (two) times daily.   bimatoprost (LUMIGAN) 0.01 % SOLN Place 1 drop into both eyes at bedtime.   Budeson-Glycopyrrol-Formoterol (BREZTRI AEROSPHERE) 160-9-4.8 MCG/ACT AERO Inhale 2 puffs twice a day with spacer to help prevent cough and wheeze   Calcium Carbonate-Vitamin D 600-200 MG-UNIT TABS Take 1 tablet by mouth daily.   Cholecalciferol (VITAMIN D-3) 1000 UNITS CAPS Take 2,000 Units by mouth daily.   donepezil (ARICEPT) 10 MG tablet TAKE ONE TABLET BY MOUTH EVERYDAY AT BEDTIME   famotidine (PEPCID) 20 MG tablet Take 1 tablet (20 mg total) by mouth at bedtime.   fluconazole (DIFLUCAN) 100 MG tablet TAKE ONE TABLET BY MOUTH EVERY 14 DAYS   fluticasone (FLONASE) 50 MCG/ACT nasal spray USE ONE SPRAY IN EACH NOSTRIL ONCE DAILY   nystatin (MYCOSTATIN) 100000 UNIT/ML suspension SWISH AND swallow one teasppon (50ml) BY MOUTH EVERY MORNING   omega-3 acid ethyl esters (LOVAZA) 1 g capsule Take 2 g by mouth daily.   omeprazole (PRILOSEC) 40 MG capsule Take 1 capsule (40 mg total) by mouth in the morning and at bedtime. Take 2 pills in the morning and 2 pills at bedtime   OVER THE COUNTER MEDICATION 1 each 2 (two) times daily. AREDS 2   Polyethyl  Glycol-Propyl Glycol (SYSTANE OP) Apply 1 drop to eye 2 (two) times daily.   rosuvastatin (CRESTOR) 5 MG tablet TAKE ONE TABLET BY MOUTH ONCE DAILY   SIMPLY SALINE NA Place into the nose 2 (two) times daily.     Review of Systems      All other systems reviewed and are otherwise negative except as noted above.  Physical Exam    VS:  BP 110/68   Pulse 60   Ht 5' 7.5" (1.715 m)   Wt 118 lb 6.4 oz (53.7 kg)   SpO2 99%   BMI 18.27 kg/m  , BMI Body mass index is 18.27 kg/m.  Wt Readings from Last 3 Encounters:  06/21/21 118 lb 6.4 oz (53.7 kg)  05/25/21 117 lb 6.4 oz (53.3 kg)  05/22/21 116 lb 12.8 oz (53 kg)     GEN: Well nourished, well developed, in no acute distress. HEENT: normal. Neck: Supple, no JVD, carotid bruits, or masses. Cardiac: RRR, no murmurs, rubs, or gallops. No clubbing, cyanosis, edema.  Radials/PT 2+ and equal bilaterally.  Respiratory:  Respirations regular and unlabored, clear to auscultation bilaterally. GI: Soft, nontender, nondistended. MS: No deformity or atrophy. Skin: Warm and dry, no rash. Neuro:  Strength and sensation are intact. Psych: Normal affect.  Assessment & Plan    HLD, LDL goal <70 - 02/2021 LDL of 40, ALT 39, AST 33. Marland Kitchen Continue Rosuvastatin 5mg  QD. Denies myalgias.   CAD - Stable with no anginal symptoms. No indication for ischemic evaluation.  GDMT incudes rosuvastatin. No BB due to baseline bradycardia. Heart healthy diet and regular cardiovascular exercise encouraged.    Disposition: Follow up in 1 year(s) with Dr. or APP.  Signed, Anne Fu, NP 06/21/2021, 9:42 AM [facility]

## 2021-06-21 NOTE — Patient Instructions (Signed)
Medication Instructions:  Continue your current medications.   *If you need a refill on your cardiac medications before your next appointment, please call your pharmacy*   Lab Work: None ordered today.  Your labs in June showed your cholesterol was well controlled.   If you have labs (blood work) drawn today and your tests are completely normal, you will receive your results only by: MyChart Message (if you have MyChart) OR A paper copy in the mail If you have any lab test that is abnormal or we need to change your treatment, we will call you to review the results.   Testing/Procedures: Your EKG today showed normal sinus rhythm which is a good result.    Follow-Up: At [facility], you and your health needs are our priority.  As part of our continuing mission to provide you with exceptional heart care, we have created designated Provider Care Teams.  These Care Teams include your primary Cardiologist (physician) and Advanced Practice Providers (APPs -  Physician Assistants and Nurse Practitioners) who all work together to provide you with the care you need, when you need it.  We recommend signing up for the patient portal called "MyChart".  Sign up information is provided on this After Visit Summary.  MyChart is used to connect with patients for Virtual Visits (Telemedicine).  Patients are able to view lab/test results, encounter notes, upcoming appointments, etc.  Non-urgent messages can be sent to your provider as well.   To learn more about what you can do with MyChart, go to [email or web address].    Your next appointment:   1 year(s)  The format for your next appointment:   In Person  Provider:   You may see Donato Schultz, MD or one of the following Advanced Practice Providers on your designated Care Team:   Nada Boozer, NP Alver Sorrow, NP    Other Instructions  Heart Healthy Diet Recommendations: A low-salt diet is recommended. Meats should be grilled, baked, or  boiled. Avoid fried foods. Focus on lean protein sources like fish or chicken with vegetables and fruits. The American Heart Association is a Chief Technology Officer!  American Heart Association Diet and Lifeystyle Recommendations    Exercise recommendations: The American Heart Association recommends 150 minutes of moderate intensity exercise weekly. Try 30 minutes of moderate intensity exercise 4-5 times per week. This could include walking, jogging, or swimming.

## 2021-06-23 ENCOUNTER — Encounter: Payer: Self-pay | Admitting: *Deleted

## 2021-06-26 ENCOUNTER — Encounter: Payer: Self-pay | Admitting: Psychology

## 2021-06-26 ENCOUNTER — Encounter (HOSPITAL_BASED_OUTPATIENT_CLINIC_OR_DEPARTMENT_OTHER): Payer: PPO | Admitting: Psychology

## 2021-06-26 ENCOUNTER — Other Ambulatory Visit: Payer: Self-pay

## 2021-06-26 DIAGNOSIS — G3184 Mild cognitive impairment, so stated: Secondary | ICD-10-CM | POA: Diagnosis not present

## 2021-06-26 NOTE — Progress Notes (Signed)
Neuropsychological Evaluation   Patient:  Victoria Trevino   DOB: May 03, 1941  MR Number: [id number]  Location: [facility] PHYSICAL MEDICINE AND REHABILITATION [address] [place], [address] [id number] [facility] [place] [address] Dept: [phone]  Start: 2 PM End: 3 PM  Provider/Observer:     Hershal Coria PsyD  Chief Complaint:      Chief Complaint  Patient presents with   Memory Loss   Depression    Reason For Service:     Victoria Trevino is an 80 year old female referred for neuropsychological evaluation by Naomie Dean, MD as part of a larger neurological work-up due to the patient's concerns around memory changes and memory loss.  The patient was initially referred for neurological work-up by her PCP Merlene Laughter, MD.  The patient recently had a neurological visit and completed a Mini-Mental state exam where she scored 28 out of 30 with one-point lost in the orientation questions to time and one-point lost on measures of attention.  The patient showed no progressive decline in MMSE.  The patient does report she has concerns about memory.  She has been having difficulty keeping up with daily activities but continues to perform all of her IDAIs and ADLs.  The patient began to work with the Alzheimer's research study through [facility] in 2015 as part of the normative studies not because she was having any difficulties at the time.  She reports that while they have not provided her with any of the findings of the study including MRI, amyloid testing or other studies she has had extensive cognitive testing as well.  We have no access to these results.  The patient goes into greater detail regarding some of her difficulties that she has noted.  The patient reports that she is having more difficulty remembering the names of acquaintances and that while she remember faces, she cannot remember their names as efficiently as in the past.   She has difficulty keeping up with her cell phone and difficulty remembering telephone numbers as easily as she did before.  She reports that cooking meals are more difficult now as she has some difficulty keeping up with exact measurements of recipes and keeping up with ingredients etc.  She also reports difficulties keeping up with eyeglasses, her purse or the books she is currently reading.  She reports that word choice and spelling are getting more difficult and she reports that occasionally she will have difficulty coming up with people's names but if cued she will have it accurately.  The patient reports that she started noticing memory changes over the past couple of years.  She reports that she has difficulty remembering names or remembering where she put things.  She is developing strategies to adjust to these recall issues.  Patient reports that she started participating in research study through [facility] in 2015 and she realized that she is not doing as well in testing as she had been in the past.  It does not appear that [facility] has provided her with any diagnostic considerations but she does report that [facility] did say that she had some "mild cognitive impairment but no other details were supplied."  The patient does have a family history of her mother and grandmother being diagnosed with Alzheimer's.  However, her mother was 54 years old when she died and the grandmother lived to 48.  Both of which had significant cognitive and memory difficulties at the  end of their life and the patient was told it was due to Alzheimer's but we do not have any evidence of formal testing or differential diagnostic considerations.  The patient's grandfather died of prostate cancer when he was younger.  The patient has been diagnosed with essential tremor several years ago by a different neurologist and reports that she does have some tremor in both right and left hand although right is often more  significant than left but they are still rather mild and manageable.  The patient denies any visual hallucinations or auditory hallucinations and reports that while she has some trouble sleeping through the night and will wake up in the middle of the night often and have trouble going back to sleep she will also have leg cramps at night which can make it difficult to sleep.  The patient reports that sometimes she has "awful dreams."  When she wakes up at 6 AM in the morning she does not always feel well rested.  The patient reports that she does get regular physical activity although last very recently.  She has seen a trainer to work on physical activity and maintain leg strength.  The patient reports that her daughter has told the patient that the daughter is worried that the patient has some type of issues since there has been cognitive conditions on both sides of the family that the daughter is familiar with.  However, the daughter lives in [place] and does not have day-to-day direct interaction with the patient.  The patient's son, who lives directly behind the patient's house denies any concerns or worries about the patient and the visits or phones the patient for 5 times during the week.  The patient is still driving and taking care of her own medications, meals, laundry, bills, housekeeping and working in the yard.  The patient reports that over the past year that she has lost confidence and counting on her memory for appointments and activities.  She depends on a calendar where she writes down all of her activities with time and place.  The patient reports that she is always kept a calendar but now is very dependent upon it.  She notes that sometimes she repeats herself when she is talking to her family.  The patient reports that her hand tremors are little worse.  Patient reports that difficulty breathing is more frequent than in the past limiting how much yard work and gardening she can do.  The  patient reports that she will spend 1 to 2 hours instead of all morning in the garden like she did the previous year.  Patient reports that she intersperse his reading for a little while between doing various household chores.  She is still driving locally.  Tests Administered: Controlled Oral Word Association Test (COWAT; FAS & Animals)  Wechsler Adult Intelligence Scale, 4th Edition (WAIS-IV) Wechsler Memory Scale, 4th Edition (WMS-IV); Older Adult Battery  Participation Level:   Active  Participation Quality:  Appropriate      Behavioral Observation:  The patient appeared well-groomed and appropriately dressed. Her manners were polite and appropriate to the situation. The patient demonstrated a positive attitude toward testing and gave good effort. She became frustrated and upset at times but continued to comply with testing and give good effort.   Well Groomed, Alert, and Appropriate.   Test Results:   Initially, an estimation was made as to historic/premorbid intellectual and cognitive functioning.  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  She always did well in math and science.  She graduated valedictorian of her high school class out of 114 students and graduated the top 10% of her nursing class.  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  We will conservatively estimate that the patient performed in the high average range relative to a normative population and likely performed in the superior range and a number of areas.  This is a conservative estimation and the patient very likely performed much higher historically but we will utilize high average to superior range for comparison of current levels of functioning to any predicted cognitive loss.    COWAT FAS Total = 41 Z = 0.357 Animals Total = 26 Z =  2.26 Initially, the patient was administered the controlled oral Word Association test.  The patient did quite well relative to normative expectations when compared against age, gender and education match normative groups.  She did well on targeted naming as well as phonetic fluency and there are no indications of objective findings of word finding difficulties or expressive language changes.   WAIS-IV             Composite Score Summary         Scale Sum of Scaled Scores Composite Score Percentile Rank 95% Conf. Interval Qualitative Description  Verbal Comprehension 38 VCI 114 82 108-119 High Average  Perceptual Reasoning 47 PRI 133 99 125-138 Very Superior  Working Memory 22 WMI 105 63 98-111 Average  Processing Speed 25 PSI 114 82 104-121 High Average  Full Scale 132 FSIQ 122 93 117-126 Superior  General Ability 85 GAI 127 96 121-131 Superior    The patient was administered the Wechsler Adult Intelligence Scale to provide a highly standardized well normed battery of cognitive measures.  However, as the patient is describing some changes in her cognitive functioning the resulting measures such as her full-scale IQ score should not be seen as a description of her lifelong functioning but more related to her current levels of cognitive functioning.  The patient produced a full-scale IQ score of 122 which falls at the 93rd percentile and is in the superior range.  This is consistent with predicted levels and would suggest that the patient is generally performing consistent with her lifelong cognitive functioning.  While there was some significant variability within different composite indices her performances ranged from the average to the very superior range with her average level of functioning having to do with auditory encoding measures which is not particularly unusual with her encoding being in the average range.  There does not typically present great variability within a normative  population in this domain.             Verbal Comprehension Subtests Summary     Subtest Raw Score Scaled Score Percentile Rank Reference Group Scaled Score SEM  Similarities 28 14 91 11 0.90  Vocabulary 45 13 84 13 0.73  Information 15 11 63 11 0.73  (Comprehension) 31 16 98 14 1.08      The patient produced a verbal comprehension index score of 114 which falls at the 82nd percentile and is in the high average range of intellectual and cognitive functioning.  All measures in this domain were within normative expectations and she performed quite well and within predicted levels on measures of verbal reasoning and problem-solving, vocabulary  knowledge as well as her social judgment comprehension.  Her general fund of information was in the average range and may represent some mild relative loss as the patient has been an avid reader throughout her life and continues to actively read.  This may have to do with pulling up factual information effectively.             Perceptual Reasoning Subtests Summary     Subtest Raw Score Scaled Score Percentile Rank Reference Group Scaled Score SEM  Block Design 42 15 95 9 1.34  Matrix Reasoning 18 15 95 9 1.12  Visual Puzzles 16 17 99 10 1.27  (Picture Completion) 8 10 50 6 1.24      The patient produced a perceptual reasoning index score of 133 which falls at the 99th percentile and is in the very superior range relative to a normative population.  She did quite well on measures of visual analysis and organization, visual constructional abilities, visual reasoning and problem-solving abilities and visual estimation and judgment.  She performed in the average range on her ability to identify visual anomalies within a visual gestalt.  In any event, there were no indications of visual-spatial or visual constructional deficits and visual reasoning and problem-solving are quite good.             Working Audiological scientist Raw Score Scaled  Score Percentile Rank Reference Group Scaled Score SEM  Digit Span 22 10 50 7 0.85  Arithmetic 14 12 75 10 0.99    The patient produced a working memory index score of 105 which falls at the 63rd percentile and is in the average range.  The patient did well on measures of pure auditory encoding capacity and actually did better relative to a normative population when asked to process that actively encoded information from her current register.  There were no indications of auditory encoding deficits.               Processing Speed Subtests Summary     Subtest Raw Score Scaled Score Percentile Rank Reference Group Scaled Score SEM  Symbol Search 23 12 75 6 1.12  Coding 54 13 84 7 1.12    The patient produced a processing speed index score of 114 which falls at the 82nd percentile and is in the high average range relative to normative population.  There was little to no variability within subtest performance.  The patient did well on measures of visual scanning, visual searching and overall speed of mental operations.     WMS-IV-Older Adult           Index Score Summary       Index Sum of Scaled Scores Index Score Percentile Rank 95% Confidence Interval Qualitative Descriptor  Auditory Memory (AMI) 33 90 25 84-97 Average  Visual Memory (VMI) 20 100 50 95-105 Average  Immediate Memory (IMI) 31 102 55 96-108 Average  Delayed Memory (DMI) 22 83 13 77-92 Low Average    The patient was then administered the Wechsler Memory Scale-for for older adults.  This allows for a very structured highly normed assessment of auditory and visual memory and learning.  On the Wechsler Adult Intelligence Scale the patient produced a working memory (auditory encoding) index score of 105 which fell at the 63rd percentile.  On measures of visual encoding capacity the patient did quite well on the Wechsler Memory Scale's and produced a symbol span subtest performance with a scaled score  of 11 which falls at the 63rd  percentile and is identical to her level of performance on auditory encoding measures.  Both of these performances would suggest that her encoding capacity is sufficient to actively store and organize newly experienced information.  Breaking the patient's memory functions down between auditory versus visual the patient produced a auditory memory index score of 90 which fell at the 25th percentile and is in the average range.  While her performance is in the average range, it is significantly below predicted levels especially predicted levels of historic premorbid levels of memory and learning capacity.  The patient produced a visual working memory of 100 which falls at the 50th percentile and is in the average range.  While this performance is also in the average range, it is below predicted levels even with regard to her current level of global cognitive functioning.  Breaking the patient's memory functions down between immediate versus delayed memory the patient produced an immediate memory index score of 102 which falls at the 55th percentile and is in the average range relative to normative population.  However, the patient produced a delayed memory index score of 83 which fell to the 13th percentile.  While the patient does well on auditory and visual encoding measures and is able to perform the average range on immediate auditory and visual memory (below predicted levels of memory and learning which should be in the high average to superior range relative to normative population) the patient shows significant loss of information that she is recently learned over a period of delay.  However, we presented the patient with a number of cued recall/recognition formats for both visual and auditory information.  On all of these recognition/cued recall if the patient's performance improved significantly.  This pattern would suggest that her primary weakness and difficulties with delayed recall has to do with  retrieval weaknesses primarily and while she may be showing some mild weakness with her ability to store and organize new information relatively speaking to her historic/premorbid functioning most of her memory weaknesses after periods of delay are directly related to retrieval difficulties rather than an inability to store and organize that newly learned information.            Primary Subtest Scaled Score Summary    Subtest Domain Raw Score Scaled Score Percentile Rank  Logical Memory I AM Logical Memory II AM Verbal Paired Associates I AM 17 10 50  Verbal Paired Associates II AM 6 11 63  Visual Reproduction I VM 32 13 84  Visual Reproduction II VM Symbol Span VWM 14 11 63                Auditory Memory Process Score Summary      Process Score Raw Score Scaled Score Percentile Rank Cumulative Percentage (Base Rate)  LM II Recognition 17 - - 26-50%  VPA II Recognition 28 - - 51-75%                   Visual Memory Process Score Summary      Process Score Raw Score Scaled Score Percentile Rank Cumulative Percentage (Base Rate)  VR II Recognition 6 - - >75%        ABILITY-MEMORY ANALYSIS   Ability Score:    GAI: 127 Date of Testing:           WAIS-IV; WMS-IV 2021/06/08  Predicted Difference Method    Index Predicted WMS-IV Index Score Actual WMS-IV Index Score Difference Critical Value   Significant Difference Y/N Base Rate  Auditory Memory 114 90 24 9.33 Y 3%  Visual Memory 116 100 16 7.72 Y 10%  Immediate Memory 118 102 16 10.41 Y 5-10%  Delayed Memory 116 83 33 10.86 Y <1%  Statistical significance (critical value) at the .01 level.       Summary of Results:   Overall, the results of the current neuropsychological evaluation indicates that the patient is maintaining the vast majority of her historic/premorbid intellectual and cognitive functioning.  The patient is doing very well on visual spatial/visual analysis measures  and performs well on verbal based skills including verbal fluency measures, verbal and visual reasoning and problem-solving capacity as well as measures of information processing speed and visual scanning/visual searching capacity.  The patient showed 1 area of relative weakness (still within the average range) with regard to her general fund of information but this is likely related to retrieval weaknesses rather than a true loss of that factual information.  The patient shows average performances relative to a normative population with regard to auditory and visual encoding capacity.  While the patient's visual memory performed somewhat better than her auditory memory they still performed in the average range and while this represents some mild memory and learning weaknesses based are still not significantly or profoundly impaired.  The patient did well on immediate memory task but showed significant loss of information after period of delay.  However, under recognition and cued recall formats the patient showed significant improvement in her memory deficits both on the current testing as well as subjective reports are likely due to problems and retrieval of information rather than an inability to encode, store or organize newly experienced information.  Impression/Diagnosis:   Overall, the results of the current neuropsychological evaluation are quite encouraging.  Clearly, the patient is maintaining the vast amount of her overall intellectual and cognitive functioning and is doing very well on measures of visual scanning and visual searching, visual and verbal reasoning and problem-solving capacity, maintaining her executive language and verbal fluency capacities as well as maintaining her overall information processing speed.  Executive function remains quite high.  In-depth analysis of memory and learning functions do identify patterns consistent with the patient's subjective reports of memory changes that  she describes as getting worse over the past couple of years.  The patient's subjective reports are consistent with mild memory and learning weaknesses.  However, in-depth analysis would suggest that the primary memory weakness has to do with retrieval of new information rather than an inability to effectively encode, store or organize new information.  This is true for both auditory and visual information.  The primary deficit is in her ability to retrieve previously learned information.  As far as diagnostic considerations, this pattern is not consistent with those typically seen with progressive cortical dementia such as Alzheimer's, Lewy body etc. or subcortical progressive dementia.  They are consistent with some potential microvascular changes in white matter tracts that would be typical and are not uncommon in his age related changes.  The patient had a past MRI conducted in 2010 that did not show any abnormalities with regard to her MRI brain functions and there was no reports of microvascular ischemic changes.  However, this is 12 years ago and there may have been some changes.  The patient does show relative changes in progressive weakening of her memory  functions directly related to retrieval deficits and best would meet the criterion for mild cognitive impairment.  Again, this pattern is not consistent with those typically seen with Alzheimer's dementia which was one of the concerns that the patient had given her family history even though her mother and grandmother would have had very late onset of this condition if it was actually the proper diagnostic label applied.  The patient is showing very well-maintained visual spatial and visual constructional capacities.  The patient should be able to maintain driving and taking care of her self but should place special care when active retrieval and free recall of information is needed.  She is already started actively using calendars and other reminders  which should help greatly.  She should also rely on other things such as timers being set whenever she is doing cooking that raises the risk of fire hazard as the patient may get involved in some other activity and forget about items on the stove.  However, her intellectual and cognitive function suggest that she is maintaining all other areas of cognitive function that would be needed to continue to live independently.  While it is possible that we are very early in assessing any cognitive changes I will remain available for repeat testing in approximately 1 year.  However, if the patient does not note any ongoing successive changes in cognition we may not have a need to do any repeat testing.  I do think that her current subjective symptoms and objective findings would be most consistent with some age related changes in white matter tracts likely related to small vessel and microvascular ischemic changes.  Diagnosis:    MCI (mild cognitive impairment)  Pattern is not particularly consistent with those seen in progressive cortical dementia such as Alzheimer's or Lewy body in the patient's tremors that have been noted have been diagnosed as essential tremor and observation of them would be consistent with this already pre-existing diagnosis. _____________________ Arley Phenix, Psy.D. Clinical Neuropsychologist

## 2021-06-30 DIAGNOSIS — H401213 Low-tension glaucoma, right eye, severe stage: Secondary | ICD-10-CM | POA: Diagnosis not present

## 2021-07-05 DIAGNOSIS — H02051 Trichiasis without entropian right upper eyelid: Secondary | ICD-10-CM | POA: Diagnosis not present

## 2021-07-05 DIAGNOSIS — H401213 Low-tension glaucoma, right eye, severe stage: Secondary | ICD-10-CM | POA: Diagnosis not present

## 2021-07-05 DIAGNOSIS — H0100B Unspecified blepharitis left eye, upper and lower eyelids: Secondary | ICD-10-CM | POA: Diagnosis not present

## 2021-07-05 DIAGNOSIS — H0100A Unspecified blepharitis right eye, upper and lower eyelids: Secondary | ICD-10-CM | POA: Diagnosis not present

## 2021-07-16 ENCOUNTER — Other Ambulatory Visit: Payer: Self-pay | Admitting: Allergy and Immunology

## 2021-08-02 ENCOUNTER — Ambulatory Visit: Payer: PPO | Admitting: Psychology

## 2021-08-02 ENCOUNTER — Encounter: Payer: PPO | Attending: Psychology | Admitting: Psychology

## 2021-08-02 ENCOUNTER — Encounter: Payer: Self-pay | Admitting: Psychology

## 2021-08-02 ENCOUNTER — Other Ambulatory Visit: Payer: Self-pay

## 2021-08-02 DIAGNOSIS — G3184 Mild cognitive impairment, so stated: Secondary | ICD-10-CM | POA: Diagnosis not present

## 2021-08-02 NOTE — Progress Notes (Signed)
08/02/2021 8 AM-9 AM:  Today's visit was an in person visit as conducted in my outpatient clinic office with the patient myself present.  Today I provided feedback regarding the results of the recent neuropsychological evaluation.  I have included the summary and diagnostic considerations below from the formal evaluation for convenience.  The entire neuropsychological evaluation can be found in the patient's EMR dated 06/26/2021.  We were able to review the results and the fact that the evaluation was not consistent with a diagnosis of progressive dementia such as Alzheimer's.  The patient reports that she has continued to have sleeping issues and waking up around 3 AM or 3:30 AM and having difficulty going back to sleep.  We talked about some sleep hygiene strategies as well.  The patient is continuing to take Aricept at night before bed and feels like it is helped somewhat.  We have set up the patient for follow-up neuropsychological assessment in approximately 9 months but the patient was instructed that if she has not noticed any changes between now and then to cancel the appointment.  I will remain available if need be in the future and we will do repeat testing if she sees progressive changes over this time.    Impression/Diagnosis:                     Overall, the results of the current neuropsychological evaluation are quite encouraging.  Clearly, the patient is maintaining the vast amount of her overall intellectual and cognitive functioning and is doing very well on measures of visual scanning and visual searching, visual and verbal reasoning and problem-solving capacity, maintaining her executive language and verbal fluency capacities as well as maintaining her overall information processing speed.  Executive function remains quite high.  In-depth analysis of memory and learning functions do identify patterns consistent with the patient's subjective reports of memory changes that she describes as  getting worse over the past couple of years.  The patient's subjective reports are consistent with mild memory and learning weaknesses.  However, in-depth analysis would suggest that the primary memory weakness has to do with retrieval of new information rather than an inability to effectively encode, store or organize new information.  This is true for both auditory and visual information.  The primary deficit is in her ability to retrieve previously learned information.  As far as diagnostic considerations, this pattern is not consistent with those typically seen with progressive cortical dementia such as Alzheimer's, Lewy body etc. or subcortical progressive dementia.  They are consistent with some potential microvascular changes in white matter tracts that would be typical and are not uncommon in his age related changes.  The patient had a past MRI conducted in 2010 that did not show any abnormalities with regard to her MRI brain functions and there was no reports of microvascular ischemic changes.  However, this is 12 years ago and there may have been some changes.  The patient does show relative changes in progressive weakening of her memory functions directly related to retrieval deficits and best would meet the criterion for mild cognitive impairment.  Again, this pattern is not consistent with those typically seen with Alzheimer's dementia which was one of the concerns that the patient had given her family history even though her mother and grandmother would have had very late onset of this condition if it was actually the proper diagnostic label applied.  The patient is showing very well-maintained visual spatial and visual constructional capacities.  The patient should be able to maintain driving and taking care of her self but should place special care when active retrieval and free recall of information is needed.  She is already started actively using calendars and other reminders which should help  greatly.  She should also rely on other things such as timers being set whenever she is doing cooking that raises the risk of fire hazard as the patient may get involved in some other activity and forget about items on the stove.  However, her intellectual and cognitive function suggest that she is maintaining all other areas of cognitive function that would be needed to continue to live independently.  While it is possible that we are very early in assessing any cognitive changes I will remain available for repeat testing in approximately 1 year.  However, if the patient does not note any ongoing successive changes in cognition we may not have a need to do any repeat testing.  I do think that her current subjective symptoms and objective findings would be most consistent with some age related changes in white matter tracts likely related to small vessel and microvascular ischemic changes.   Diagnosis:                                MCI (mild cognitive impairment)   Pattern is not particularly consistent with those seen in progressive cortical dementia such as Alzheimer's or Lewy body in the patient's tremors that have been noted have been diagnosed as essential tremor and observation of them would be consistent with this already pre-existing diagnosis. _____________________ Arley Phenix, Psy.D. Clinical Neuropsychologist

## 2021-08-04 ENCOUNTER — Other Ambulatory Visit: Payer: Self-pay

## 2021-08-04 ENCOUNTER — Other Ambulatory Visit: Payer: Self-pay | Admitting: Family Medicine

## 2021-08-04 ENCOUNTER — Ambulatory Visit: Payer: PPO | Admitting: Family Medicine

## 2021-08-04 ENCOUNTER — Ambulatory Visit
Admission: RE | Admit: 2021-08-04 | Discharge: 2021-08-04 | Disposition: A | Discharge status: Home / Self Care | Payer: PPO | Source: Ambulatory Visit | Attending: Family Medicine | Admitting: Family Medicine

## 2021-08-04 ENCOUNTER — Encounter: Payer: Self-pay | Admitting: Family Medicine

## 2021-08-04 VITALS — BP 114/58 | HR 81 | Temp 97.6°F | Resp 12 | Ht 68.0 in | Wt 117.6 lb

## 2021-08-04 DIAGNOSIS — J3089 Other allergic rhinitis: Secondary | ICD-10-CM

## 2021-08-04 DIAGNOSIS — K219 Gastro-esophageal reflux disease without esophagitis: Secondary | ICD-10-CM

## 2021-08-04 DIAGNOSIS — R051 Acute cough: Secondary | ICD-10-CM

## 2021-08-04 DIAGNOSIS — J4530 Mild persistent asthma, uncomplicated: Secondary | ICD-10-CM | POA: Diagnosis not present

## 2021-08-04 DIAGNOSIS — R059 Cough, unspecified: Secondary | ICD-10-CM | POA: Diagnosis not present

## 2021-08-04 MED ORDER — AMOXICILLIN-POT CLAVULANATE 875-125 MG PO TABS: 1.0000 | ORAL_TABLET | Freq: Two times a day (BID) | ORAL | 0 refills | Status: AC

## 2021-08-04 NOTE — Progress Notes (Signed)
[address] [place] [address] Dept: [phone]  FOLLOW UP NOTE  Patient ID: Victoria Trevino, female    DOB: 03/22/1941  Age: 80 y.o. MRN: [id number] Date of Office Visit: 08/04/2021  Assessment  Chief Complaint: Asthma and Cough (Fever of 101.8 last night. Sneezing. Productive and green this morning.)  HPI Victoria Trevino is an 80 year old female who presents the clinic for evaluation of cough.  She was last seen in this clinic on 05/22/2021 by Nehemiah Settle, FNP, for evaluation of asthma with acute exacerbation requirehu prednisone, allergic rhinitis, and reflux.  At today's visit, she reports that she began to experience nasal symptoms and cough that began about 1 week ago.  She reports that her cough began to worsen last night.  She reports a temperature of 101.8, sweats, chills, and fatigue.  She denies sick contacts.  Her asthma is reported as moderately well controlled shortness of breath while walking, wheeze while coughing, and cough producing some phlegm over the last week.  She reports that this morning about 4 AM she experienced a cough producing thick copious green mucus.  She continues Breztri 2 puffs twice a day and rarely uses albuterol.  She does report albuterol use yesterday with moderate relief of symptoms.  Allergic rhinitis is reported as moderately well controlled with rhinorrhea ranging from clear to yellow to green, increased sneezing over the last week, tightness in her forehead, slight headache, and occasional postnasal drainage.  She continues saline nasal rinses daily, Flonase once a day, and is not currently using azelastine.  Reflux is reported as well controlled with omeprazole and famotidine.  She continues to follow up with her gastroenterologist.  Her current medications are listed in the chart.   Drug Allergies:  Allergies  Allergen Reactions   Valium [Diazepam]     hypersensitivity    Physical Exam: BP (!) 114/58   Pulse 81   Temp 97.6 F  (36.4 C) (Temporal)   Resp 12   Ht 5\' 8"  (1.727 m)   Wt 117 lb 9.6 oz (53.3 kg)   SpO2 98%   BMI 17.88 kg/m    Physical Exam Vitals reviewed.  Constitutional:      Appearance: Normal appearance.  HENT:     Head: Normocephalic and atraumatic.     Right Ear: Tympanic membrane normal.     Left Ear: Tympanic membrane normal.     Nose:     Comments: Bilateral nares slightly erythematous with clear nasal drainage noted.  Pharynx normal.  Ears normal.  Eyes normal.    Mouth/Throat:     Pharynx: Oropharynx is clear.  Eyes:     Conjunctiva/sclera: Conjunctivae normal.  Cardiovascular:     Rate and Rhythm: Normal rate and regular rhythm.     Heart sounds: No murmur heard. Pulmonary:     Effort: Pulmonary effort is normal.     Breath sounds: Normal breath sounds.     Comments: Lungs clear to auscultation Musculoskeletal:        General: Normal range of motion.     Cervical back: Normal range of motion and neck supple.  Skin:    General: Skin is warm and dry.  Neurological:     Mental Status: She is alert and oriented to person, place, and time.  Psychiatric:        Mood and Affect: Mood normal.        Behavior: Behavior normal.        Thought Content: Thought content  normal.        Judgment: Judgment normal.    Diagnostics: Deferred due to cough and fever  Assessment and Plan: 1. Acute cough   2. Not well controlled mild persistent asthma   3. Other allergic rhinitis   4. LPRD (laryngopharyngeal reflux disease)     Meds ordered this encounter  Medications   amoxicillin-clavulanate (AUGMENTIN) 875-125 MG tablet    Sig: Take 1 tablet by mouth 2 (two) times daily for 7 days.    Dispense:  14 tablet    Refill:  0    Patient Instructions  Cough Get a chest x-ray.  We will call you with the result as soon as it becomes available.  Acute sinusitis Begin Augmentin 875 mg twice a day for 7 days Continue nasal saline rinses  Asthma Continue Breztri 2 puffs twice a  day with a spacer to prevent cough or wheeze Continue albuterol 2 puffs once every 4 hours as needed for cough or wheeze You may use albuterol 2 puffs 5 to 15 minutes before activity to decrease cough or wheeze  Allergic rhinitis Continue Flonase 2 sprays in each nostril once a day as needed for a stuffy nose.  In the right nostril, point the applicator out toward the right ear. In the left nostril, point the applicator out toward the left ear Restart azelastine 1 to 2 sprays in each nostril once or twice a day as needed for runny nose Continue saline nasal rinses as needed for nasal symptoms. Use this before any medicated nasal sprays for best result  Reflux Continue dietary and lifestyle modifications as listed below Continue your treatment plan as outlined by your gastroenterologist.  Call the clinic if this treatment plan is not working well for you.  Follow up in 2 weeks or sooner if needed.   Return in about 2 weeks (around 08/18/2021), or if symptoms worsen or fail to improve.    Thank you for the opportunity to care for this patient.  Please do not hesitate to contact me with questions.  Thermon Leyland, FNP [facility]

## 2021-08-04 NOTE — Patient Instructions (Addendum)
Cough Get a chest x-ray.  We will call you with the result as soon as it becomes available.  Acute sinusitis Begin Augmentin 875 mg twice a day for 7 days Continue nasal saline rinses  Asthma Continue Breztri 2 puffs twice a day with a spacer to prevent cough or wheeze Continue albuterol 2 puffs once every 4 hours as needed for cough or wheeze You may use albuterol 2 puffs 5 to 15 minutes before activity to decrease cough or wheeze  Allergic rhinitis Continue Flonase 2 sprays in each nostril once a day as needed for a stuffy nose.  In the right nostril, point the applicator out toward the right ear. In the left nostril, point the applicator out toward the left ear Restart azelastine 1 to 2 sprays in each nostril once or twice a day as needed for runny nose Continue saline nasal rinses as needed for nasal symptoms. Use this before any medicated nasal sprays for best result  Reflux Continue dietary and lifestyle modifications as listed below Continue your treatment plan as outlined by your gastroenterologist.  Call the clinic if this treatment plan is not working well for you.  Follow up in 2 weeks or sooner if needed.

## 2021-08-04 NOTE — Progress Notes (Signed)
Patient notified of negative result of chest xray.

## 2021-08-08 ENCOUNTER — Ambulatory Visit: Payer: PPO | Admitting: Allergy and Immunology

## 2021-08-08 ENCOUNTER — Other Ambulatory Visit: Payer: Self-pay

## 2021-08-08 VITALS — BP 110/60 | HR 79 | Temp 98.0°F | Resp 16 | Ht 67.0 in | Wt 116.6 lb

## 2021-08-08 DIAGNOSIS — B370 Candidal stomatitis: Secondary | ICD-10-CM | POA: Diagnosis not present

## 2021-08-08 DIAGNOSIS — J3089 Other allergic rhinitis: Secondary | ICD-10-CM

## 2021-08-08 DIAGNOSIS — J4540 Moderate persistent asthma, uncomplicated: Secondary | ICD-10-CM | POA: Diagnosis not present

## 2021-08-08 DIAGNOSIS — K219 Gastro-esophageal reflux disease without esophagitis: Secondary | ICD-10-CM | POA: Diagnosis not present

## 2021-08-08 MED ORDER — NYSTATIN 100000 UNIT/ML MT SUSP: 5.0000 mL | Freq: Every day | OROMUCOSAL | 5 refills | Status: DC | PRN

## 2021-08-08 MED ORDER — ALBUTEROL SULFATE HFA 108 (90 BASE) MCG/ACT IN AERS
2.0000 | INHALATION_SPRAY | Freq: Four times a day (QID) | RESPIRATORY_TRACT | 0 refills | Status: DC | PRN
Start: 2021-08-08 — End: 2022-08-07

## 2021-08-08 MED ORDER — AZELASTINE HCL 0.1 % NA SOLN
2.0000 | Freq: Two times a day (BID) | NASAL | 5 refills | Status: DC
Start: 2021-08-08 — End: 2022-02-27

## 2021-08-08 MED ORDER — FLUCONAZOLE 100 MG PO TABS: ORAL_TABLET | ORAL | 5 refills | Status: DC

## 2021-08-08 MED ORDER — OMEPRAZOLE 40 MG PO CPDR
80.0000 mg | DELAYED_RELEASE_CAPSULE | Freq: Two times a day (BID) | ORAL | 5 refills | Status: DC
Start: 2021-08-08 — End: 2021-12-14

## 2021-08-08 MED ORDER — FAMOTIDINE 20 MG PO TABS: 20.0000 mg | ORAL_TABLET | Freq: Every evening | ORAL | 5 refills | Status: DC

## 2021-08-08 MED ORDER — BREZTRI AEROSPHERE 160-9-4.8 MCG/ACT IN AERO: 2.0000 | INHALATION_SPRAY | Freq: Two times a day (BID) | RESPIRATORY_TRACT | 5 refills | Status: DC

## 2021-08-08 MED ORDER — FLUTICASONE PROPIONATE 50 MCG/ACT NA SUSP: NASAL | 1 refills | Status: DC

## 2021-08-08 NOTE — Patient Instructions (Addendum)
   1. Continue Breztri - 2 inhalations twice a day   2. Continue omeprazole 80 mg in AM + 40-80 mg in PM + famotidine 20 mg in evening  3. Continue nasal fluticasone 1-2 spray each nostril 1 time per day  4. Continue a combination:    A. Diflucan 100 one tablet every 2 weeks.  B. nystatin oral solution 1 teaspoon swish and swallow every morning  5. May use OTC Mucinex DM, azelastine, and ProAir HFA as needed  6. Finish Augmentin  7. Further treatment???  6. Return to clinic in 6 months or earlier if problem

## 2021-08-08 NOTE — Progress Notes (Signed)
[place] [facility] [place] [facility] [place] - [place] - [place]   Follow-up Note  Referring Provider: Merlene Laughter, MD Primary Provider: Merlene Laughter, MD Date of Office Visit: 08/08/2021  Subjective:   Victoria Trevino (DOB: [birth year]) is a 80 y.o. female who [facility] in re-evaluation of the following:  HPI: Victoria Trevino returns to this clinic in evaluation of asthma, allergic rhinitis, and reflux and history of recurrent thrush.  She was last seen in this clinic by our nurse practitioner on 04 August 2021 with what appeared to be an acute exacerbation of her respiratory tract issue most likely secondary to a viral respiratory tract infection associated with a fever of 101.8.  Fortunately, she has resolved that fever and has resolved the chills and feeling awful although she is still very fatigued.  Her phlegm has decreased and her cough has decreased.  She is not using her short acting bronchodilator.  She has received 5 COVID vaccines and has received this year's flu vaccine.  Allergies as of 08/08/2021       Reactions   Valium [diazepam]    hypersensitivity        Medication List    albuterol 108 (90 Base) MCG/ACT inhaler Commonly known as: Proventil HFA Inhale 2 puffs into the lungs every 6 (six) hours as needed for wheezing or shortness of breath.   amoxicillin-clavulanate 875-125 MG tablet Commonly known as: Augmentin Take 1 tablet by mouth 2 (two) times daily for 7 days.   aspirin EC 81 MG tablet Take 81 mg by mouth daily. Swallow whole.   azelastine 0.1 % nasal spray Commonly known as: ASTELIN Place 2 sprays into both nostrils 2 (two) times daily.   Breztri Aerosphere 160-9-4.8 MCG/ACT Aero Generic drug: Budeson-Glycopyrrol-Formoterol Inhale two puffs twice a day   Calcium Carbonate-Vitamin D 600-200 MG-UNIT Tabs Take 1 tablet by mouth daily.   donepezil 10 MG tablet Commonly known as: ARICEPT TAKE ONE TABLET BY MOUTH  EVERYDAY AT BEDTIME   famotidine 20 MG tablet Commonly known as: Pepcid Take 1 tablet (20 mg total) by mouth at bedtime.   fluconazole 100 MG tablet Commonly known as: DIFLUCAN TAKE ONE TABLET BY MOUTH EVERY 14 DAYS   fluticasone 50 MCG/ACT nasal spray Commonly known as: FLONASE USE ONE SPRAY IN EACH NOSTRIL ONCE DAILY   Lumigan 0.01 % Soln Generic drug: bimatoprost Place 1 drop into both eyes at bedtime.   nystatin 100000 UNIT/ML suspension Commonly known as: MYCOSTATIN SWISH AND swallow one teasppon (47ml) BY MOUTH EVERY MORNING   omega-3 acid ethyl esters 1 g capsule Commonly known as: LOVAZA Take 2 g by mouth daily.   omeprazole 40 MG capsule Commonly known as: PRILOSEC Take 1 capsule (40 mg total) by mouth in the morning and at bedtime. Take 2 pills in the morning and 2 pills at bedtime   OVER THE COUNTER MEDICATION 1 each 2 (two) times daily. AREDS 2   rosuvastatin 5 MG tablet Commonly known as: CRESTOR TAKE ONE TABLET BY MOUTH ONCE DAILY   SIMPLY SALINE NA Place into the nose 2 (two) times daily.   SYSTANE OP Apply 1 drop to eye 2 (two) times daily.   TYLENOL PO Take 325 mg by mouth at bedtime as needed.   Vitamin D-3 25 MCG (1000 UT) Caps Take 2,000 Units by mouth daily.    Past Medical History:  Diagnosis Date   Allergic rhinoconjunctivitis 08/02/2015   Allergies    Arthritis  hands   Asthma    Dr Laurette Schimke   Asthma, well controlled 10/31/2017   Blepharitis    GERD (gastroesophageal reflux disease)    Glaucoma    Hereditary and idiopathic peripheral neuropathy 02/16/2016   Hx of fracture    Left wrist 2018; right patella (separate incident) 2018; used braces   Left wrist injury 01/18/2017   LPRD (laryngopharyngeal reflux disease) 08/02/2015   Macular degeneration    Memory loss    in study at [place], [place] 02/2020   Migraine    Moderate persistent asthma 08/02/2015   Osteopenia    Sensorimotor neuropathy    moderate-severe with chronic  denervation- Dr Cyndi Lennert, oral 10/31/2017   Vertigo    occasional positional    Past Surgical History:  Procedure Laterality Date   BUNIONECTOMY Right    Right great toe and little toe, pins in both, [place], [place]   CATARACT EXTRACTION, BILATERAL     with implants; R eye 08/2017 and L eye 09/2017   Cervical decompression and fusion  1997   COLONOSCOPY     2003, 2008, 2013   ECTOPIC PREGNANCY SURGERY     injection laryngeal plasty 2008     per notes from [facility]   KNEE SURGERY Left    meniscus @ [employer], [place], [place]   nissan infundibulm     SALPINGECTOMY Right    [place], [place]   THROAT SURGERY     vocal cords injected with liquid bone  2005   [facility], [place]    Review of systems negative except as noted in HPI / PMHx or noted below:  Review of Systems  Constitutional: Negative.   HENT: Negative.    Eyes: Negative.   Respiratory: Negative.    Cardiovascular: Negative.   Gastrointestinal: Negative.   Genitourinary: Negative.   Musculoskeletal: Negative.   Skin: Negative.   Neurological: Negative.   Endo/Heme/Allergies: Negative.   Psychiatric/Behavioral: Negative.      Objective:   Vitals:   08/08/21 0902  BP: 110/60  Pulse: 79  Resp: 16  Temp: 98 F (36.7 C)  SpO2: 98%   Height: 5\' 7"  (170.2 cm)  Weight: 116 lb 9.6 oz (52.9 kg)   Physical Exam Constitutional:      Appearance: She is not diaphoretic.  HENT:     Head: Normocephalic.     Right Ear: Tympanic membrane, ear canal and external ear normal.     Left Ear: Tympanic membrane, ear canal and external ear normal.     Nose: Nose normal. No mucosal edema or rhinorrhea.     Mouth/Throat:     Pharynx: Uvula midline. No oropharyngeal exudate.  Eyes:     Conjunctiva/sclera: Conjunctivae normal.  Neck:     Thyroid: No thyromegaly.     Trachea: Trachea normal. No tracheal tenderness or tracheal deviation.  Cardiovascular:     Rate and Rhythm: Normal rate and  regular rhythm.     Heart sounds: Normal heart sounds, S1 normal and S2 normal. No murmur heard. Pulmonary:     Effort: No respiratory distress.     Breath sounds: Normal breath sounds. No stridor. No wheezing or rales.  Lymphadenopathy:     Head:     Right side of head: No tonsillar adenopathy.     Left side of head: No tonsillar adenopathy.     Cervical: No cervical adenopathy.  Skin:    Findings: No erythema or rash.     Nails: There  is no clubbing.  Neurological:     Mental Status: She is alert.    Diagnostics:    Spirometry was performed and demonstrated an FEV1 of 1.66 at 74 % of predicted.  Results of a chest x-ray obtained 04 August 2021 identified the following:  Heart size and mediastinal contours are within normal limits. Stable calcified density to the left of the aortic arch. No suspicious pulmonary opacities identified. No pleural effusion or pneumothorax visualized.  Assessment and Plan:   1. Asthma, moderate persistent, well-controlled   2. Other allergic rhinitis   3. LPRD (laryngopharyngeal reflux disease)   4. Thrush, oral    1. Continue Breztri - 2 inhalations twice a day   2. Continue omeprazole 80 mg in AM + 40-80 mg in PM + famotidine 20 mg in evening  3. Continue nasal fluticasone 1-2 spray each nostril 1 time per day  4. Continue a combination:    A. Diflucan 100 one tablet every 2 weeks.  B. nystatin oral solution 1 teaspoon swish and swallow every morning  5. May use OTC Mucinex DM, azelastine, and ProAir HFA as needed  6. Finish Augmentin  7. Further treatment???  6. Return to clinic in 6 months or earlier if problem  Victoria Trevino appears to be resolving her respiratory tract infection which gave rise to significant inflammation of her airway and we are not really going to change much of her therapy at this point in time and assume that over the course of the next 2 weeks she will resolve all of her respiratory tract symptoms.  She will  continue to use a collection of anti-inflammatory agents for her airway and also continue to address the issue of reflux induced respiratory disease and her recurrent thrush.  I will see her back in this clinic in 6 months or earlier if there is a problem.  Laurette Schimke, MD Allergy / Immunology [facility]

## 2021-08-09 ENCOUNTER — Encounter: Payer: Self-pay | Admitting: Allergy and Immunology

## 2021-09-06 ENCOUNTER — Ambulatory Visit: Payer: PPO | Admitting: Psychology

## 2021-09-15 DIAGNOSIS — K219 Gastro-esophageal reflux disease without esophagitis: Secondary | ICD-10-CM | POA: Diagnosis not present

## 2021-09-15 DIAGNOSIS — G301 Alzheimer's disease with late onset: Secondary | ICD-10-CM | POA: Diagnosis not present

## 2021-09-15 DIAGNOSIS — J4530 Mild persistent asthma, uncomplicated: Secondary | ICD-10-CM | POA: Diagnosis not present

## 2021-09-15 DIAGNOSIS — M8580 Other specified disorders of bone density and structure, unspecified site: Secondary | ICD-10-CM | POA: Diagnosis not present

## 2021-09-15 DIAGNOSIS — E7800 Pure hypercholesterolemia, unspecified: Secondary | ICD-10-CM | POA: Diagnosis not present

## 2021-09-15 DIAGNOSIS — F0280 Dementia in other diseases classified elsewhere without behavioral disturbance: Secondary | ICD-10-CM | POA: Diagnosis not present

## 2021-10-23 ENCOUNTER — Telehealth: Payer: Self-pay | Admitting: Allergy and Immunology

## 2021-10-23 NOTE — Telephone Encounter (Signed)
Patient received letter from insurance about her omeprazole. Patient is need in a prior authorization.  Best contact number: [phone]

## 2021-11-20 DIAGNOSIS — H6061 Unspecified chronic otitis externa, right ear: Secondary | ICD-10-CM | POA: Diagnosis not present

## 2021-11-20 DIAGNOSIS — H6690 Otitis media, unspecified, unspecified ear: Secondary | ICD-10-CM | POA: Diagnosis not present

## 2021-11-23 ENCOUNTER — Encounter: Payer: Self-pay | Admitting: Neurology

## 2021-11-23 ENCOUNTER — Ambulatory Visit: Payer: PPO | Admitting: Neurology

## 2021-11-23 VITALS — BP 121/66 | HR 65 | Ht 68.0 in | Wt 119.0 lb

## 2021-11-23 DIAGNOSIS — R413 Other amnesia: Secondary | ICD-10-CM

## 2021-11-27 DIAGNOSIS — R413 Other amnesia: Secondary | ICD-10-CM | POA: Insufficient documentation

## 2021-11-27 NOTE — Progress Notes (Signed)
?[facility] ? ? ? ?Provider:  Dr Lucia Gaskins ?Requesting Provider: Merlene Laughter, MD ?Primary Care Provider:  Merlene Laughter, MD ? ?CC:  Memory loss ? ?11/23/2021: Spent 20 minutes reviewing Dr. Marvetta Gibbons notes, essentially he did not contact neurodegenerative disorder, overall the results were quite positive, patient's MMSE is stable, we discussed in detail, I reassured patient, she is to see Dr. Kieth Brightly again in 9 months, I spent 12 additional minutes with patient answering any questions, reassuring her, she was very quite happy.  At this time she can follow-up as needed, I will continue to follow when she has update with Dr. Kieth Brightly.  Dr. Marvetta Gibbons notes can be read in epic. ? ?Patient complains of symptoms per HPI as well as the following symptoms: memory loss . Pertinent negatives and positives per HPI. All others negative ? ? ?05/25/2021: She has an appointment with Dr. Kieth Brightly next week. She is concerned about memory. She puts her cell phone down and forgets where she put it so she bought a tool belt to put her phone in it. Patient is stable today, I tried to reassure her that people with dementia progress and she does not seem to be progressing.Marland Kitchen  Here independently, she performs all her IADls and ADLs. 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 Her MMSE is stable. She lives right next door to her son who she says is wonderful and she is enjoying life and gardening and loves being outdoors. She loves to read, she watches TV, she loves hallmark, she is active, no depression or anxiety.  ? ?HPI:  Victoria Trevino is a 81 y.o. female here as requested by Merlene Laughter, MD for memory loss.  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 Allena Katz  in the past, benign essential tremor, memory lost diagnosed with mild cognitive impairment June 2021 at [facility], I reviewed "care everywhere" but any research notes are not available for me to review.  I reviewed Dr. Laverle Hobby notes, she has a history of Alzheimer's disease, she is in a study at [facility] [employer] and they have told her she has some concerning memory loss, she still independent in all of her activities of daily living, she is under some stress at home as she is caring for a family member who has definite cognitive limitations for dementia and this has been a difficult management problem for her. She has been with [facility] since 2015 participating in drug studies.  ? ?Patient is here with son. She has had a lot of work up at [facility]. Her memory is not as good as it once was. They have not said anything to her about the MRIs, the amyloid testing, she doesn't get to know that. She has had extensive testing at [facility]. They have told her she has progressed to the point she has Alzheimer's disease. I discussed going back to [facility] because I do not have access to any of their research results. She is having difficulty with dates. Here with her son. She can compensate. She doesn't miss any of her medications. She uses a calendar. 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 Her son says she  has a routine. She doesn't get lost. She performs her own ADLs and IADLs. She lives independently, her son lives close by and checks on her daily. She gardens, she has a Production manager garden. She is worried that she doesn't get prevention. Her mother died at 15. Mother had Alzheimer,  her grandmother was in her 7s with Alzheimers. Still driving, still doing well, more short-term memory. Good insight and judgement. 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 She has only fallen twice that she knows  of, mechanical fall and once when she had the flu, she works very hard due to her imbalance (she has neuropathy). She eats very healthy.  ? ?Reviewed notes, labs and imaging from outside physicians, which showed:  ? ?Vitamin D was 85.2, CMP was normal with BUN 17 and creatinine 0.81, CBC was normal, B12 472, magnesium 2.3, February 05, 2020. ? ? ?Review of Systems: ?Patient complains of symptoms per HPI as well as the following symptoms Alzheimer's. Pertinent negatives and positives per HPI. All others negative. ? ? ?Social History  ? ?Socioeconomic History  ? Marital status: Widowed  ?  Spouse name: Not on file  ? Number of children: Not on file  ? Years of education: 3 yrs nursing school after H.S.  ? Highest education level: Not on file  ?Occupational History  ? Not on file  ?Tobacco Use  ? Smoking status: Never  ? Smokeless tobacco: Never  ?Vaping Use  ? Vaping Use: Never used  ?Substance and Sexual Activity  ? Alcohol use: Not Currently  ?  Comment: was a glass of wine one time per year  ? Drug use: Never  ? Sexual activity: Yes  ?  Birth control/protection: Condom  ?Other Topics Concern  ? Not on file  ?Social History Narrative  ? Lives alone in a one story home.  Has 2 children.  Retired Charity fundraiser.  Husband passed away in 11/10/16.    ?   ? Son lives next to her  ? Caffeine: NONE since 2000, was previously 1 cup coffee/day from 1969-2000  ? ?Social Determinants of Health  ? ?Financial Resource Strain: Not on file  ?Food Insecurity: Not on file  ?Transportation Needs: Not on file  ?Physical Activity: Not on file  ?Stress: Not on file  ?Social Connections: Not on file  ?Intimate Partner Violence: Not on file  ? ? ?Family History  ?Problem Relation Age of Onset  ? Alzheimer's disease Mother   ?     Deceased, 105  ? Hypercholesterolemia Mother   ? Heart attack Father   ? Congestive Heart Failure Father   ?     Deceased, 65  ? Emphysema Father   ?     smoker  ? CAD Father   ? Benign prostatic hyperplasia Father   ? Skin  cancer Sister   ? Allergic rhinitis Sister   ? Hypertension Sister   ? Hypercholesterolemia Sister   ? Obesity Sister   ? Irritable bowel syndrome Sister   ? Aneurysm Brother   ? Skin cancer Brother   ?     basal cell  ? Memory loss Brother   ? COPD Brother   ? Congestive Heart Failure Brother   ? Emphysema Brother   ? Stroke Maternal Grandmother   ? Dementia Maternal Grandmother   ? Prostate cancer Maternal Grandfather   ? Dementia Maternal Grandfather   ? Diabetes Son   ? Hypertension Son   ? Diabetes Daughter   ?  Asthma Daughter   ? Stroke Paternal Grandmother   ? CAD Paternal Grandfather   ? ? ?Past Medical History:  ?Diagnosis Date  ? Allergic rhinoconjunctivitis 08/02/2015  ? Allergies   ? Arthritis   ? hands  ? Asthma   ? Dr Laurette SchimkeEric Kozlow  ? Asthma, well controlled 10/31/2017  ? Blepharitis   ? GERD (gastroesophageal reflux disease)   ? Glaucoma   ? Hereditary and idiopathic peripheral neuropathy 02/16/2016  ? Hx of fracture   ? Left wrist 2018; right patella (separate incident) 2018; used braces  ? Left wrist injury 01/18/2017  ? LPRD (laryngopharyngeal reflux disease) 08/02/2015  ? Macular degeneration   ? Memory loss   ? in [facility]  ? Migraine   ? Moderate persistent asthma 08/02/2015  ? Osteopenia   ? Sensorimotor neuropathy   ? moderate-severe with chronic denervation- Dr [name]  ? Thrush, oral 10/31/2017  ? Vertigo   ? occasional positional  ? ? ?Patient Active Problem List  ? Diagnosis Date Noted  ? Age-related memory disorder 11/27/2021  ? Acute cough 08/04/2021  ? Not well controlled mild persistent asthma 08/04/2021  ? Other allergic rhinitis 08/04/2021  ? MCI (mild cognitive impairment) 01/12/2021  ? Asthma, well controlled 10/31/2017  ? Thrush, oral 10/31/2017  ? Left wrist injury 01/18/2017  ? Hereditary and idiopathic peripheral neuropathy 02/16/2016  ? LPRD (laryngopharyngeal reflux disease) 08/02/2015  ? Allergic rhinoconjunctivitis 08/02/2015  ? Moderate persistent asthma  08/02/2015  ? ? ?Past Surgical History:  ?Procedure Laterality Date  ? BUNIONECTOMY Right   ? Right great toe and little toe, pins in [place]  ? CATARACT EXTRACTION, BILATERAL    ? with

## 2021-11-30 ENCOUNTER — Telehealth: Payer: Self-pay | Admitting: Allergy and Immunology

## 2021-11-30 NOTE — Telephone Encounter (Signed)
Patient called today to let Dr Lucie Leather know that her insurance ([employer]) is questioning the necessity of Omeprazole 2 capsules before breakfast and 2 capsules before dinner. Victoria Trevino stated that she tried to do 1 capsule before breakfast and 1 capsule before dinner but it has failed her and her reflux got worse. Victoria Trevino stated that she has went back to 2 in the morning and 2 before dinner. Victoria Trevino stated that insurance company will be calling Dr Lucie Leather in the next few days about the necessity of her Omeprazole. ?

## 2021-12-01 NOTE — Telephone Encounter (Signed)
Received a fax from insurance stating that a written form needs to be printed from the website, filled out, and faxed to them including records. Will work on this Monday. Called patient and left a voicemail advising that I am working on this and will call her this coming week with follow up information for the PA.  ?

## 2021-12-01 NOTE — Telephone Encounter (Signed)
Attempted to initiate PA through CoverMyMeds and it stated that a PA has been initiated. Will wait and see if a determination come through.  ?

## 2021-12-05 NOTE — Telephone Encounter (Signed)
Appeal and chart records has been faxed to patients insurance and is currently pending.  ?

## 2021-12-10 ENCOUNTER — Other Ambulatory Visit: Payer: Self-pay | Admitting: Cardiology

## 2021-12-11 NOTE — Telephone Encounter (Signed)
Received a "Redetermination Notice of Denial of Medicare Prescription Drug Coverage"  denied because "Utilization management rules not met Part D sponsors are responsible for ensuring that covered part D drugs are being prescribed for medically accepted indications". Placed letter in scanning.  ? ?Left a message for pt to return call.  ?

## 2021-12-12 NOTE — Telephone Encounter (Signed)
Spoke with patient and advised that I will call her insurance today and call her with an update. Patient verbalized understanding.  ?

## 2021-12-12 NOTE — Telephone Encounter (Signed)
Patient came in to office wanting get clarification for denial of Omeprazole. I advised to Joni Reining to inform patient that I will have to call insurance company to see what the denial means since I filed with Medicare Part D and that I would call the patient today with an update. Joni Reining advised patient.  ?

## 2021-12-14 ENCOUNTER — Other Ambulatory Visit: Payer: Self-pay | Admitting: *Deleted

## 2021-12-14 MED ORDER — OMEPRAZOLE 40 MG PO CPDR: 40.0000 mg | DELAYED_RELEASE_CAPSULE | Freq: Two times a day (BID) | ORAL | 5 refills | Status: DC

## 2021-12-14 NOTE — Telephone Encounter (Signed)
I called and spoke with patient and advised, patient verbalized understanding. I sent in a prescription for the 40mg  2 times daily to her pharmacy. She is going to call in a couple weeks to find some cheap over the counter options for omeprazole to help supplement.  ?

## 2021-12-14 NOTE — Telephone Encounter (Signed)
Patient's insurance is not wanting to cover the patient taking Omeprazole 40mg  2 capsules 2 times daily. Patient was wondering is there something else that can be replaced that would be sufficient for what she is currently taking?  ?

## 2021-12-15 DIAGNOSIS — J4530 Mild persistent asthma, uncomplicated: Secondary | ICD-10-CM | POA: Diagnosis not present

## 2021-12-15 DIAGNOSIS — K219 Gastro-esophageal reflux disease without esophagitis: Secondary | ICD-10-CM | POA: Diagnosis not present

## 2021-12-15 DIAGNOSIS — M8580 Other specified disorders of bone density and structure, unspecified site: Secondary | ICD-10-CM | POA: Diagnosis not present

## 2021-12-26 DIAGNOSIS — H9201 Otalgia, right ear: Secondary | ICD-10-CM | POA: Diagnosis not present

## 2022-01-01 DIAGNOSIS — H43813 Vitreous degeneration, bilateral: Secondary | ICD-10-CM | POA: Diagnosis not present

## 2022-01-01 DIAGNOSIS — H524 Presbyopia: Secondary | ICD-10-CM | POA: Diagnosis not present

## 2022-01-01 DIAGNOSIS — H401213 Low-tension glaucoma, right eye, severe stage: Secondary | ICD-10-CM | POA: Diagnosis not present

## 2022-01-01 DIAGNOSIS — H26493 Other secondary cataract, bilateral: Secondary | ICD-10-CM | POA: Diagnosis not present

## 2022-01-03 ENCOUNTER — Telehealth: Payer: Self-pay | Admitting: Orthopedic Surgery

## 2022-01-03 ENCOUNTER — Ambulatory Visit (INDEPENDENT_AMBULATORY_CARE_PROVIDER_SITE_OTHER): Payer: PPO

## 2022-01-03 ENCOUNTER — Encounter: Payer: Self-pay | Admitting: Orthopedic Surgery

## 2022-01-03 ENCOUNTER — Ambulatory Visit: Payer: PPO | Admitting: Orthopedic Surgery

## 2022-01-03 DIAGNOSIS — G8929 Other chronic pain: Secondary | ICD-10-CM

## 2022-01-03 DIAGNOSIS — M25512 Pain in left shoulder: Secondary | ICD-10-CM

## 2022-01-03 NOTE — Progress Notes (Signed)
? ?Office Visit Note ?  ?Patient: Victoria Trevino           ?Date of Birth: 12/23/1940           ?MRN: [id number] ?Visit Date: 01/03/2022 ?Requested by: Merlene LaughterStoneking, Hal, MD ?[address] ?[address] ?[place],  [place] [id number] ?PCP: Merlene LaughterStoneking, Hal, MD ? ?Subjective: ?Chief Complaint  ?Patient presents with  ? Left Shoulder - Pain  ? ? ?HPI: Patient presents for evaluation of left shoulder pain.  She was doing household task on March 1 when she felt a pop in the left shoulder.  Immediately had weakness and pain and that has not improved over the last 5 to 6 weeks.  She likes to exercise and workout a lot and has not been able to do that.  Reports fairly profound weakness in the left arm in terms of limitation of forward flexion and AB duction.  She does like to work out a lot.  Does not bother her from sleep at night.  Is otherwise healthy. ?             ?ROS: All systems reviewed are negative as they relate to the chief complaint within the history of present illness.  Patient denies  fevers or chills. ? ? ?Assessment & Plan: ?Visit Diagnoses:  ?1. Chronic left shoulder pain   ? ? ?Plan: Impression is left shoulder rotator cuff tear which may be subscap and/or supraspinatus.  She has pretty reasonable infraspinatus strength.  Does have some coarseness on examination left shoulder but not the right.  Based on the functional disability she is having as well as the lack of improvement over the past 6 weeks MRI arthrogram is indicated.  I think based on her activity level she is someone who would consider operative intervention to restore that rotator cuff if it is torn.  My concern at this time is for the subscap which could be torn and would require sooner rather than later repair.  Follow-up after that study ? ?Follow-Up Instructions: Return for after MRI.  ? ?Orders:  ?Orders Placed This Encounter  ?Procedures  ? XR Shoulder Left  ? ?No orders of the defined types were placed in this encounter. ? ? ? ? Procedures: ?No  procedures performed ? ? ?Clinical Data: ?No additional findings. ? ?Objective: ?Vital Signs: There were no vitals taken for this visit. ? ?Physical Exam:  ? ?Constitutional: Patient appears well-developed ?HEENT:  ?Head: Normocephalic ?Eyes:EOM are normal ?Neck: Normal range of motion ?Cardiovascular: Normal rate ?Pulmonary/chest: Effort normal ?Neurologic: Patient is alert ?Skin: Skin is warm ?Psychiatric: Patient has normal mood and affect ? ? ?Ortho Exam: Ortho exam on the right demonstrates full active and passive range of motion.  Very good subscap infraspinatus supraspinatus strength on the right-hand side.  On the left-hand side her subscap strength is weaker at 3-4 out of 5 infraspinatus strength is 5- out of 5 supraspinatus strength is 4- out of 5.  She does have some coarse grinding and crepitus with internal/external rotation at 90 degrees of abduction.  Active forward flexion is around 60 degrees active forward AB duction is around 60 degrees.  Passive range of motion on the left is 60/110/170.  Deltoid is functional.  No paresthesias in the left arm and no definite neck pain with flexion and extension ? ?Specialty Comments:  ?No specialty comments available. ? ?Imaging: ?XR Shoulder Left ? ?Result Date: 01/03/2022 ?AP and axillary outlet radiographs left shoulder reviewed.  No acute fracture.  Small ossific density noted at the greater tuberosity on the AP view measuring 2 x 2 mm.  Acromiohumeral distance maintained.  No glenohumeral joint or AC joint arthritis.  ? ? ?PMFS History: ?Patient Active Problem List  ? Diagnosis Date Noted  ? Age-related memory disorder 11/27/2021  ? Acute cough 08/04/2021  ? Not well controlled mild persistent asthma 08/04/2021  ? Other allergic rhinitis 08/04/2021  ? MCI (mild cognitive impairment) 01/12/2021  ? Asthma, well controlled 10/31/2017  ? Thrush, oral 10/31/2017  ? Left wrist injury 01/18/2017  ? Hereditary and idiopathic peripheral neuropathy 02/16/2016  ? LPRD  (laryngopharyngeal reflux disease) 08/02/2015  ? Allergic rhinoconjunctivitis 08/02/2015  ? Moderate persistent asthma 08/02/2015  ? ?Past Medical History:  ?Diagnosis Date  ? Allergic rhinoconjunctivitis 08/02/2015  ? Allergies   ? Arthritis   ? hands  ? Asthma   ? Dr Allena Katz  ? Asthma, well controlled 10/31/2017  ? Blepharitis   ? GERD (gastroesophageal reflux disease)   ? Glaucoma   ? Hereditary and idiopathic peripheral neuropathy 02/16/2016  ? Hx of fracture   ? Left wrist 2018; right patella (separate incident) 2018; used braces  ? Left wrist injury 01/18/2017  ? LPRD (laryngopharyngeal reflux disease) 08/02/2015  ? Macular degeneration   ? Memory loss   ? in study [place] 02/[place]  ? Migraine   ? Moderate persistent asthma 08/02/2015  ? Osteopenia   ? Sensorimotor neuropathy   ? moderate-severe with chronic denervation- Dr Posey Pronto  ? Thrush, oral 10/31/2017  ? Vertigo   ? occasional positional  ?  ?Family History  ?Problem Relation Age of Onset  ? Alzheimer's disease Mother   ?     Deceased, 105  ? Hypercholesterolemia Mother   ? Heart attack Father   ? Congestive Heart Failure Father   ?     Deceased, 1  ? Emphysema Father   ?     smoker  ? CAD Father   ? Benign prostatic hyperplasia Father   ? Skin cancer Sister   ? Allergic rhinitis Sister   ? Hypertension Sister   ? Hypercholesterolemia Sister   ? Obesity Sister   ? Irritable bowel syndrome Sister   ? Aneurysm Brother   ? Skin cancer Brother   ?     basal cell  ? Memory loss Brother   ? COPD Brother   ? Congestive Heart Failure Brother   ? Emphysema Brother   ? Stroke Maternal Grandmother   ? Dementia Maternal Grandmother   ? Prostate cancer Maternal Grandfather   ? Dementia Maternal Grandfather   ? Diabetes Son   ? Hypertension Son   ? Diabetes Daughter   ? Asthma Daughter   ? Stroke Paternal Grandmother   ? CAD Paternal Grandfather   ?  ?Past Surgical History:  ?Procedure Laterality Date  ? BUNIONECTOMY Right   ? Right great toe and little toe,  pins in both, [place], [place]  ? CATARACT EXTRACTION, BILATERAL    ? with implants; R eye 08/2017 and L eye 09/2017  ? Cervical decompression and fusion  1997  ? COLONOSCOPY    ? 2003, 2008, 2013  ? ECTOPIC PREGNANCY SURGERY    ? injection laryngeal plasty 2008    ? per notes from [facility]  ? KNEE SURGERY Left   ? meniscus @ [employer], [place]  ? nissan infundibulm    ? SALPINGECTOMY Right   ? [place], [place]  ? THROAT SURGERY    ?  vocal cords injected with liquid bone  2005  ? [place], [place]  ? ?Social History  ? ?Occupational History  ? Not on file  ?Tobacco Use  ? Smoking status: Never  ? Smokeless tobacco: Never  ?Vaping Use  ? Vaping Use: Never used  ?Substance and Sexual Activity  ? Alcohol use: Not Currently  ?  Comment: was a glass of wine one time per year  ? Drug use: Never  ? Sexual activity: Yes  ?  Birth control/protection: Condom  ? ? ? ? ? ?

## 2022-01-03 NOTE — Addendum Note (Signed)
Addended by: Lennox Solders on: 01/03/2022 10:23 AM ? ? Modules accepted: Orders ? ?

## 2022-01-03 NOTE — Telephone Encounter (Signed)
Patient called advised she will need something called into her pharmacy prior to the MRI. Patient said she is claustrophobic. Patient uses [facility]. The number to contact patient is [phone]  ?

## 2022-01-04 ENCOUNTER — Other Ambulatory Visit: Payer: Self-pay | Admitting: Surgical

## 2022-01-04 MED ORDER — DIAZEPAM 2 MG PO TABS: 2.0000 mg | ORAL_TABLET | Freq: Once | ORAL | 0 refills | Status: AC

## 2022-01-04 NOTE — Telephone Encounter (Signed)
Patient called in saying she has claustrophobia and would like a mild sedation before she has her MRI. ?[id number] pharmacy ? ? [address] [place], [place] [address] ?

## 2022-01-04 NOTE — Telephone Encounter (Signed)
I called Victoria Trevino, she said she has taken valium before without any significant reaction. I sent in RX for her.

## 2022-01-05 ENCOUNTER — Other Ambulatory Visit: Payer: Self-pay | Admitting: Surgical

## 2022-01-05 ENCOUNTER — Telehealth: Payer: Self-pay | Admitting: Orthopedic Surgery

## 2022-01-05 MED ORDER — DIAZEPAM 2 MG PO TABS: 2.0000 mg | ORAL_TABLET | Freq: Once | ORAL | 0 refills | Status: AC

## 2022-01-05 NOTE — Telephone Encounter (Signed)
noted 

## 2022-01-05 NOTE — Telephone Encounter (Signed)
Looks like I sent it to the wrong pharmacy initially.  Just sent to the [facility].  Let me know if there is any issues

## 2022-01-05 NOTE — Telephone Encounter (Signed)
Patient called. Says the pharmacy did not get a RX for her. Would like a call or the medication called in to [facility] [address] [place], [place] [address] ?

## 2022-01-05 NOTE — Telephone Encounter (Signed)
Tried calling to advise submitted. No answer. No VM to LM.  

## 2022-01-10 ENCOUNTER — Ambulatory Visit
Admission: RE | Admit: 2022-01-10 | Discharge: 2022-01-10 | Disposition: A | Discharge status: Home / Self Care | Payer: PPO | Source: Ambulatory Visit | Attending: Orthopedic Surgery | Admitting: Orthopedic Surgery

## 2022-01-10 DIAGNOSIS — M25512 Pain in left shoulder: Secondary | ICD-10-CM | POA: Diagnosis not present

## 2022-01-10 DIAGNOSIS — G8929 Other chronic pain: Secondary | ICD-10-CM | POA: Diagnosis not present

## 2022-01-10 DIAGNOSIS — S46012A Strain of muscle(s) and tendon(s) of the rotator cuff of left shoulder, initial encounter: Secondary | ICD-10-CM | POA: Diagnosis not present

## 2022-01-10 MED ORDER — IOPAMIDOL (ISOVUE-M 200) INJECTION 41%
12.0000 mL | Freq: Once | INTRAMUSCULAR | Status: AC
  Administered 2022-01-10: 12 mL via INTRA_ARTICULAR

## 2022-01-17 ENCOUNTER — Encounter: Payer: Self-pay | Admitting: Orthopedic Surgery

## 2022-01-17 ENCOUNTER — Ambulatory Visit (INDEPENDENT_AMBULATORY_CARE_PROVIDER_SITE_OTHER): Payer: PPO | Admitting: Surgical

## 2022-01-17 DIAGNOSIS — M75102 Unspecified rotator cuff tear or rupture of left shoulder, not specified as traumatic: Secondary | ICD-10-CM | POA: Diagnosis not present

## 2022-01-17 NOTE — Progress Notes (Signed)
Office Visit Note   Patient: Victoria Trevino           Date of Birth: Dec 24, 1940           MRN: [id number] Visit Date: 01/17/2022 Requested by: Merlene Laughter, MD [address] [place],  [place] [address] PCP: Merlene Laughter, MD  Subjective: Chief Complaint  Patient presents with   Left Shoulder - Follow-up    HPI: Victoria Trevino is a 81 y.o. female who presents to the office for MRI review. Patient denies any changes in symptoms.  Continues to complain mainly of left shoulder pain.  Pain bothers her at night but does not necessarily wake her up at night.  She does a lot of yard work and this gets in the way of her doing what she wants to do.  She is very active and exercises but cannot do any exercising with this left shoulder pain.  She is right-hand dominant.  She has no history of prior surgery to the shoulder.  Medical history significant for GERD and asthma with no history of heart disease, blood thinner use, smoking, diabetes, CKD, osteoporosis.  She takes aspirin.  She lives at home alone but her son is her next-door neighbor.  MRI results revealed: MR Shoulder Left w/ contrast  Result Date: 01/13/2022 CLINICAL DATA:  Left shoulder pain and limited range of motion since 11/01/2021 EXAM: MR ARTHROGRAM OF THE LEFT SHOULDER TECHNIQUE: Multiplanar, multisequence MR imaging of the left shoulder was performed following the administration of intra-articular contrast. CONTRAST:  See Injection Documentation. COMPARISON:  X-ray 11/03/2021 FINDINGS: Rotator cuff: Tendinosis with full-thickness tear of the posterior supraspinatus tendon from its insertion with 7 mm of tendon retraction. Tear measures 8 mm in AP dimension. Additional areas of partial thickness insertional and articular surface tearing of the distal supraspinatus tendon. Mild-moderate infraspinatus and subscapularis tendinosis without tear. Intact teres minor. Muscles: Preserved bulk and signal intensity of the rotator cuff  musculature without edema, atrophy, or fatty infiltration. Biceps long head:  Intra-articular biceps tendinosis. Acromioclavicular Joint: Minimal degenerative changes of the AC joint. Small volume contrast within the subacromial-subdeltoid bursal space. Glenohumeral Joint: Adequately distended with injected contrast. Mild diffuse cartilage thinning without focal defect. Labrum: Possible partial-thickness labral tear of underlying the biceps anchor site (series 8, image 15). Bones:No acute fracture. No dislocation. No bone marrow edema.No suspicious marrow replacing bone lesion. Other: None. IMPRESSION: 1. Rotator cuff tendinosis with focal full-thickness mildly retracted tear of the posterior supraspinatus tendon. 2. Intra-articular biceps tendinosis. 3. Possible partial-thickness labral tear underlying the biceps anchor site. Electronically Signed   By: Duanne Guess D.O.   On: 01/13/2022 10:40                 ROS: All systems reviewed are negative as they relate to the chief complaint within the history of present illness.  Patient denies fevers or chills.  Assessment & Plan: Visit Diagnoses:  1. Tear of left supraspinatus tendon     Plan: Victoria Trevino is a 81 y.o. female who presents to the office for evaluation of left shoulder pain.  Patient had acute injury in March with subsequent MRI that is reviewed today with her demonstrating slightly retracted supraspinatus tear.  Also has tendinosis of the bicep tendon.  Discussed options for her shoulder and she would like to proceed with surgical intervention.  Though she is 27, she takes good care of her body and seems biologically younger.  No history of  osteoporosis.  Her mother lived to be 105.  No significant medical comorbidities.  Surgery would entail rotator cuff repair with left shoulder arthroscopy and mini open rotator cuff repair as well as biceps tenodesis.  Discussed the recovery timeframe and restrictions after surgery.  Also discussed the  risks and benefits of the procedure including the risk of nerve/vessel damage, shoulder stiffness, need for revision surgery in the future, infection, medical complication from surgery.  After discussion, patient would like to proceed.  She will be posted for surgery and follow-up after procedure.  Follow-Up Instructions: No follow-ups on file.   Orders:  No orders of the defined types were placed in this encounter.  No orders of the defined types were placed in this encounter.     Procedures: No procedures performed   Clinical Data: No additional findings.  Objective: Vital Signs: There were no vitals taken for this visit.  Physical Exam:  Constitutional: Patient appears well-developed HEENT:  Head: Normocephalic Eyes:EOM are normal Neck: Normal range of motion Cardiovascular: Normal rate Pulmonary/chest: Effort normal Neurologic: Patient is alert Skin: Skin is warm Psychiatric: Patient has normal mood and affect  Ortho Exam: Ortho exam demonstrates left shoulder with well-preserved active and passive range of motion.  Increased pain with terminal abduction and forward flexion.  External rotation to 60 degrees, abduction to 90 degrees, forward flexion to 150 degrees.  5/5 EPL, grip strength, finger abduction, pronation/supination, bicep, tricep, deltoid.  Excellent strength of supra, infra, subscap with reproduction of pain with supra/infra strength testing.  Axillary nerve intact with deltoid firing.  Specialty Comments:  No specialty comments available.  Imaging: No results found.   PMFS History: Patient Active Problem List   Diagnosis Date Noted   Age-related memory disorder 11/27/2021   Acute cough 08/04/2021   Not well controlled mild persistent asthma 08/04/2021   Other allergic rhinitis 08/04/2021   MCI (mild cognitive impairment) 01/12/2021   Asthma, well controlled 10/31/2017   Thrush, oral 10/31/2017   Left wrist injury 01/18/2017   Hereditary and  idiopathic peripheral neuropathy 02/16/2016   LPRD (laryngopharyngeal reflux disease) 08/02/2015   Allergic rhinoconjunctivitis 08/02/2015   Moderate persistent asthma 08/02/2015   Past Medical History:  Diagnosis Date   Allergic rhinoconjunctivitis 08/02/2015   Allergies    Arthritis    hands   Asthma    Dr Laurette SchimkeEric Kozlow   Asthma, well controlled 10/31/2017   Blepharitis    GERD (gastroesophageal reflux disease)    Glaucoma    Hereditary and idiopathic peripheral neuropathy 02/16/2016   Hx of fracture    Left wrist 2018; right patella (separate incident) 2018; used braces   Left wrist injury 01/18/2017   LPRD (laryngopharyngeal reflux disease) 08/02/2015   Macular degeneration    Memory loss    in [facility] [place]   Migraine    Moderate persistent asthma 08/02/2015   Osteopenia    Sensorimotor neuropathy    moderate-severe with chronic denervation- Dr Cyndi LennertPatel   Thrush, oral 10/31/2017   Vertigo    occasional positional    Family History  Problem Relation Age of Onset   Alzheimer's disease Mother        Deceased, [id number]   Hypercholesterolemia Mother    Heart attack Father    Congestive Heart Failure Father        Deceased, 1678   Emphysema Father        smoker   CAD Father    Benign prostatic hyperplasia Father  Skin cancer Sister    Allergic rhinitis Sister    Hypertension Sister    Hypercholesterolemia Sister    Obesity Sister    Irritable bowel syndrome Sister    Aneurysm Brother    Skin cancer Brother        basal cell   Memory loss Brother    COPD Brother    Congestive Heart Failure Brother    Emphysema Brother    Stroke Maternal Grandmother    Dementia Maternal Grandmother    Prostate cancer Maternal Grandfather    Dementia Maternal Grandfather    Diabetes Son    Hypertension Son    Diabetes Daughter    Asthma Daughter    Stroke Paternal Grandmother    CAD Paternal Grandfather     Past Surgical History:  Procedure Laterality Date    BUNIONECTOMY Right    Right great toe and little toe, pins in both, [place], [place]   CATARACT EXTRACTION, BILATERAL     with implants; R eye 08/2017 and L eye 09/2017   Cervical decompression and fusion  1997   COLONOSCOPY     2003, 2008, 2013   ECTOPIC PREGNANCY SURGERY     injection laryngeal plasty 2008     per notes from [facility]   KNEE SURGERY Left    meniscus @ [employer], [place], [place]   nissan infundibulm     SALPINGECTOMY Right    [place], [place]   THROAT SURGERY     vocal cords injected with liquid bone  2005   [facility], [place]   Social History   Occupational History   Not on file  Tobacco Use   Smoking status: Never   Smokeless tobacco: Never  Vaping Use   Vaping Use: Never used  Substance and Sexual Activity   Alcohol use: Not Currently    Comment: was a glass of wine one time per year   Drug use: Never   Sexual activity: Yes    Birth control/protection: Condom

## 2022-01-23 DIAGNOSIS — H26492 Other secondary cataract, left eye: Secondary | ICD-10-CM | POA: Diagnosis not present

## 2022-01-29 IMAGING — DX DG CHEST 2V
2 series · 2 of 2 positions shown · non-contrast
Comparison: Chest x-ray 08/09/2004

CLINICAL DATA: Cough

EXAM:
CHEST - 2 VIEW

[dg chest 2 view (1 of 2)]
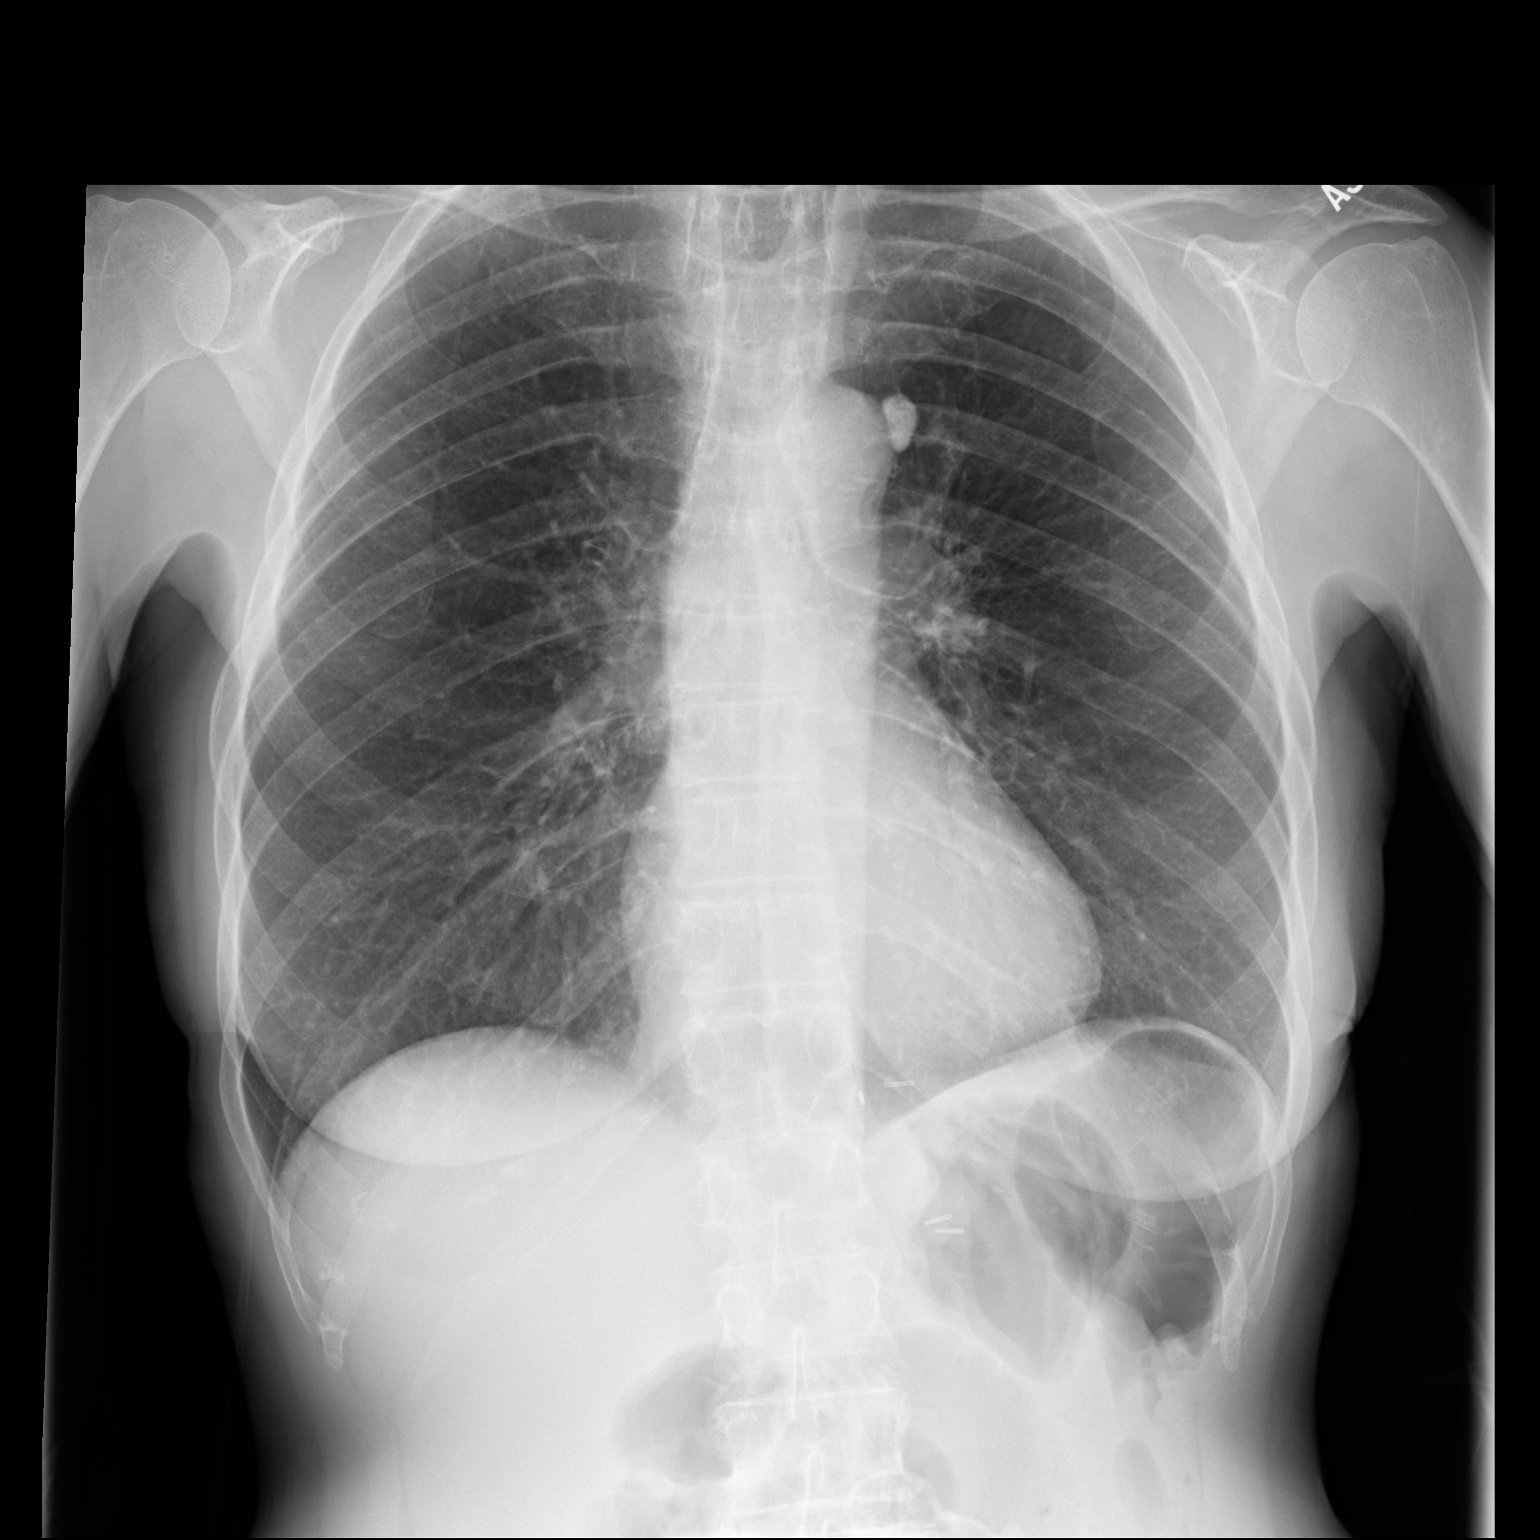

[dg chest 2 view (2 of 2)]
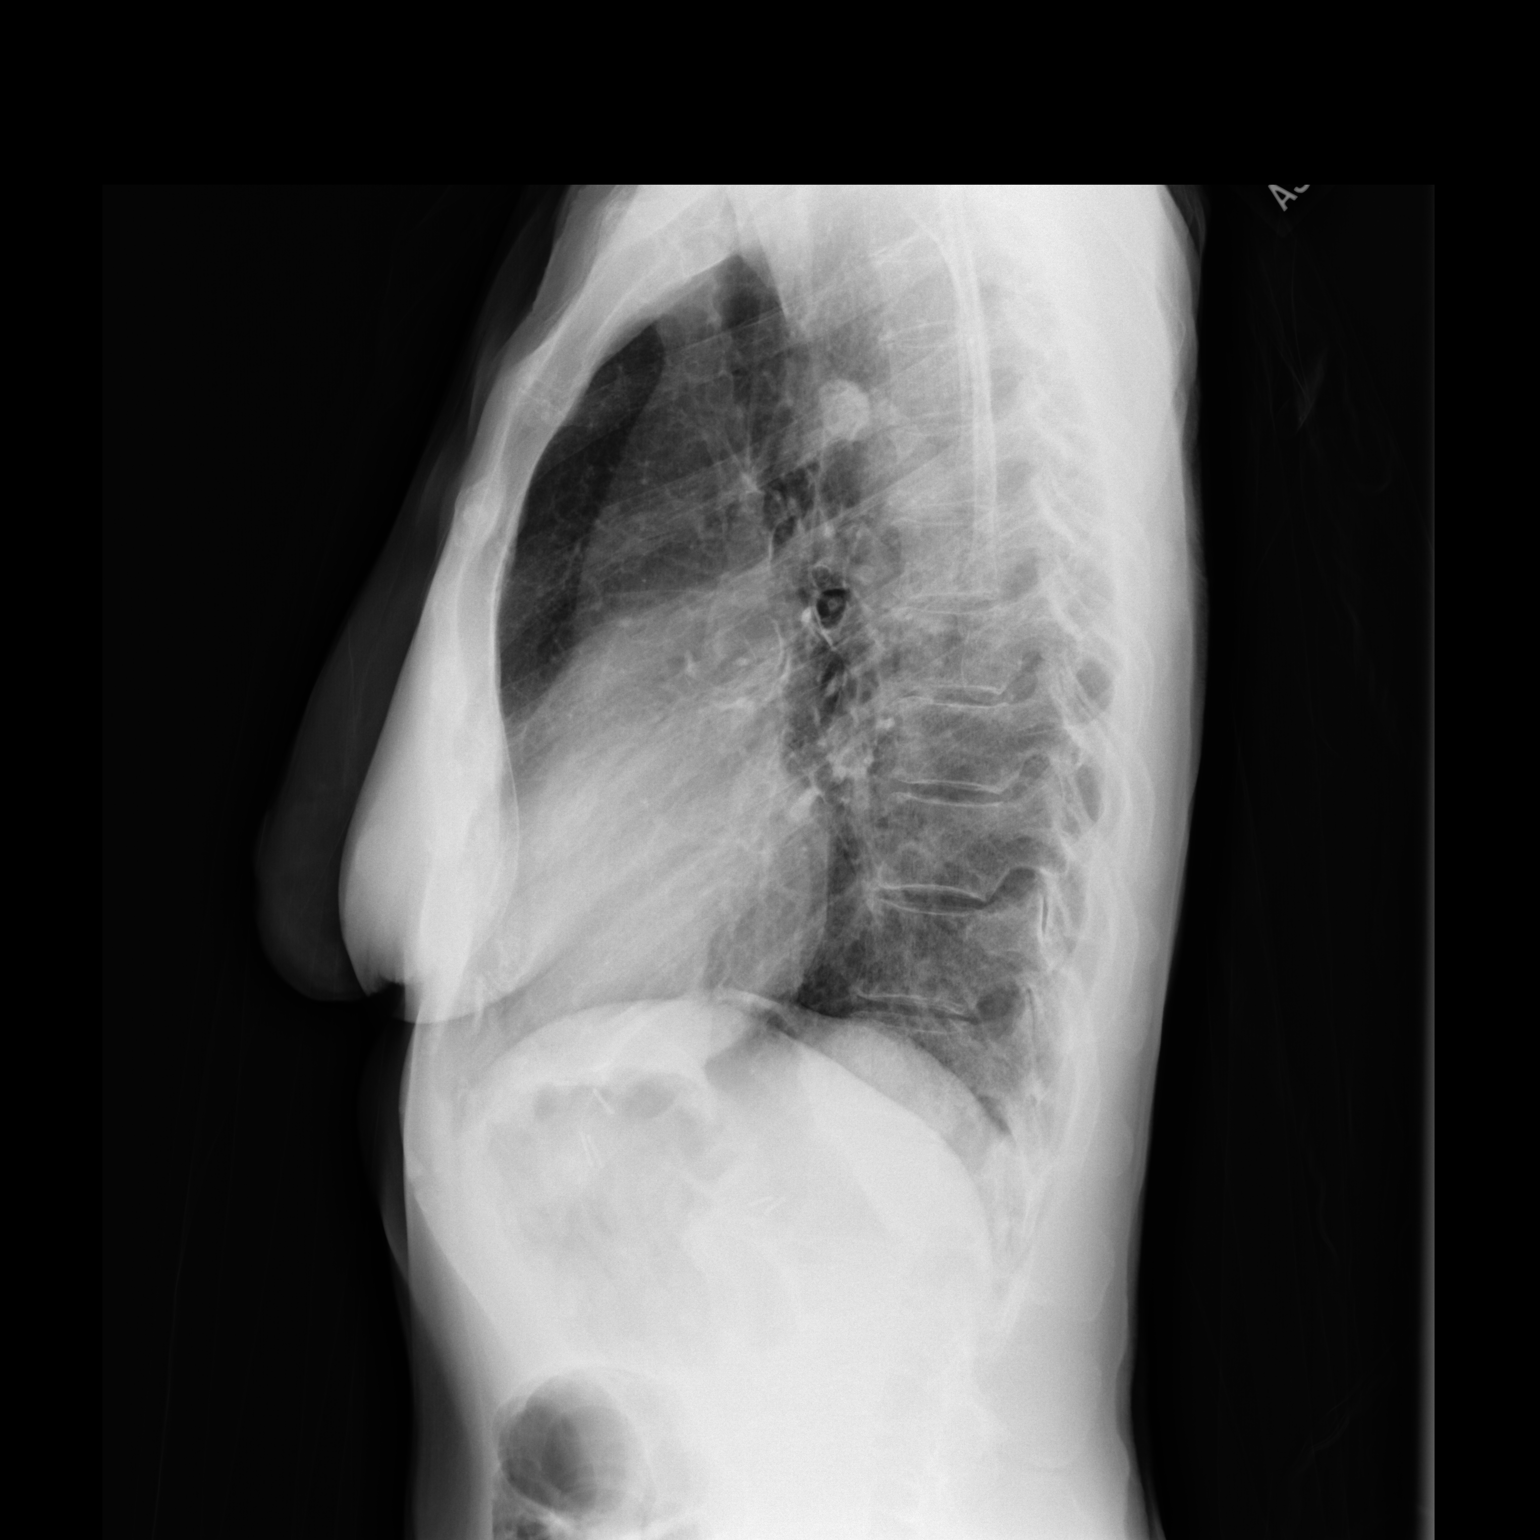

[2 of 2 positions shown; findings below may reference images not displayed]

FINDINGS: Heart size and mediastinal contours are within normal limits. Stable
calcified density to the left of the aortic arch. No suspicious
pulmonary opacities identified.

No pleural effusion or pneumothorax visualized.

No acute osseous abnormality appreciated.
IMPRESSION: No acute intrathoracic process identified.

## 2022-02-04 ENCOUNTER — Other Ambulatory Visit: Payer: Self-pay | Admitting: Allergy and Immunology

## 2022-02-13 ENCOUNTER — Ambulatory Visit (INDEPENDENT_AMBULATORY_CARE_PROVIDER_SITE_OTHER): Payer: PPO | Admitting: Allergy and Immunology

## 2022-02-13 ENCOUNTER — Encounter: Payer: Self-pay | Admitting: Allergy and Immunology

## 2022-02-13 VITALS — BP 116/66 | HR 60 | Temp 97.9°F | Resp 16 | Ht 68.0 in | Wt 116.0 lb

## 2022-02-13 DIAGNOSIS — K219 Gastro-esophageal reflux disease without esophagitis: Secondary | ICD-10-CM | POA: Diagnosis not present

## 2022-02-13 DIAGNOSIS — J4540 Moderate persistent asthma, uncomplicated: Secondary | ICD-10-CM | POA: Diagnosis not present

## 2022-02-13 DIAGNOSIS — J3089 Other allergic rhinitis: Secondary | ICD-10-CM

## 2022-02-13 NOTE — Progress Notes (Unsigned)
Victoria Trevino - [place] [facility] [place] [facility] [place] - [place]   Follow-up Note  Referring Provider: Merlene Laughter, MD Primary Provider: Merlene Laughter, MD Date of Office Visit: 02/13/2022  Subjective:   Victoria Trevino (DOB: 28-Nov-1940) is a 81 y.o. female who [facility] in re-evaluation of the following:  HPI: Victoria Trevino returns to this clinic in evaluation of asthma, allergic rhinitis, reflux, and a history of recurrent thrush.  Her last visit to this clinic was 08 August 2021.  She is really done well with her asthma and has not required a systemic steroid to treat exacerbation and rarely uses a short acting bronchodilator where she continues on Breztri twice a day.  She has not had any problems with thrush.  She is done very well with her nose and has not required a antibiotic to treat an episode of sinusitis where she continues on nasal fluticasone and occasionally adds and nasal azelastine.  Her reflux is doing okay on her current therapy which includes a very high dose omeprazole plus famotidine in the evening.  Her insurance company is only paying for 2 tablets of 40 mg omeprazole and she is purchasing 2 tablets of 40 mg omeprazole over-the-counter.  Allergies as of 02/13/2022       Reactions   Valium [diazepam]    hypersensitivity        Medication List    albuterol 108 (90 Base) MCG/ACT inhaler Commonly known as: Proventil HFA Inhale 2 puffs into the lungs every 6 (six) hours as needed for wheezing or shortness of breath.   aspirin EC 81 MG tablet Take 81 mg by mouth daily. Swallow whole.   azelastine 0.1 % nasal spray Commonly known as: ASTELIN Place 2 sprays into both nostrils 2 (two) times daily.   Breztri Aerosphere 160-9-4.8 MCG/ACT Aero Generic drug: Budeson-Glycopyrrol-Formoterol Inhale 2 puffs into the lungs in the morning and at bedtime. Inhale two puffs twice a day   Calcium Carbonate-Vitamin D 600-200 MG-UNIT  Tabs Take 1 tablet by mouth daily.   donepezil 10 MG tablet Commonly known as: ARICEPT TAKE ONE TABLET BY MOUTH EVERYDAY AT BEDTIME   famotidine 20 MG tablet Commonly known as: Pepcid Take 1 tablet (20 mg total) by mouth at bedtime.   fluconazole 100 MG tablet Commonly known as: DIFLUCAN TAKE ONE TABLET BY MOUTH EVERY 14 DAYS   fluticasone 50 MCG/ACT nasal spray Commonly known as: FLONASE USE ONE SPRAY IN EACH NOSTRIL ONCE DAILY   Lumigan 0.01 % Soln Generic drug: bimatoprost Place 1 drop into both eyes at bedtime.   NEOMYCIN-POLYMYXIN-HYDROCORTISONE 1 % Soln OTIC solution Commonly known as: CORTISPORIN SMARTSIG:In Ear(s)   nystatin 100000 UNIT/ML suspension Commonly known as: MYCOSTATIN Take 5 mLs (500,000 Units total) by mouth daily as needed.   omega-3 acid ethyl esters 1 g capsule Commonly known as: LOVAZA Take 2 g by mouth daily.   omeprazole 40 MG capsule Commonly known as: PRILOSEC Take 1 capsule (40 mg total) by mouth in the morning and at bedtime.   OVER THE COUNTER MEDICATION 1 each 2 (two) times daily. AREDS 2   rosuvastatin 5 MG tablet Commonly known as: CRESTOR TAKE ONE TABLET BY MOUTH ONCE DAILY (biocon brand)   SIMPLY SALINE NA Place into the nose 2 (two) times daily.   SYSTANE OP Apply 1 drop to eye 2 (two) times daily.   TYLENOL PO Take 325 mg by mouth at bedtime as needed.   Vitamin D-3  25 MCG (1000 UT) Caps Take 2,000 Units by mouth daily.    Past Medical History:  Diagnosis Date   Allergic rhinoconjunctivitis 08/02/2015   Allergies    Arthritis    hands   Asthma    Dr Laurette SchimkeEric Aimie Wagman   Asthma, well controlled 10/31/2017   Blepharitis    GERD (gastroesophageal reflux disease)    Glaucoma    Hereditary and idiopathic peripheral neuropathy 02/16/2016   Hx of fracture    Left wrist 2018; right patella (separate incident) 2018; used braces   Left wrist injury 01/18/2017   LPRD (laryngopharyngeal reflux disease) 08/02/2015   Macular  degeneration    Memory loss    in study at [facility] 02/2020   Migraine    Moderate persistent asthma 08/02/2015   Osteopenia    Sensorimotor neuropathy    moderate-severe with chronic denervation- Dr Cyndi LennertPatel   Thrush, oral 10/31/2017   Vertigo    occasional positional    Past Surgical History:  Procedure Laterality Date   BUNIONECTOMY Right    Right great toe and little toe, pins in both, [place], [place]   CATARACT EXTRACTION, BILATERAL     with implants; R eye 08/2017 and L eye 09/2017   Cervical decompression and fusion  1997   COLONOSCOPY     2003, 2008, 2013   ECTOPIC PREGNANCY SURGERY     injection laryngeal plasty 2008     [facility]   KNEE SURGERY Left    meniscus @ [employer], [place]   nissan infundibulm     [place]   THROAT SURGERY     vocal cords injected with liquid bone  2005   [facility] [place]    Review of systems negative except as noted in HPI / PMHx or noted below:  Review of Systems  Constitutional: Negative.   HENT: Negative.    Eyes: Negative.   Respiratory: Negative.    Cardiovascular: Negative.   Gastrointestinal: Negative.   Genitourinary: Negative.   Musculoskeletal: Negative.   Skin: Negative.   Neurological: Negative.   Endo/Heme/Allergies: Negative.   Psychiatric/Behavioral: Negative.       Objective:   Vitals:   02/13/22 1123  BP: 116/66  Pulse: 60  Resp: 16  Temp: 97.9 F (36.6 C)  SpO2: 96%   Height: 5\' 8"  (172.7 cm)  Weight: 116 lb (52.6 kg)   Physical Exam Constitutional:      Appearance: She is not diaphoretic.  HENT:     Head: Normocephalic.     Right Ear: Tympanic membrane, ear canal and external ear normal.     Left Ear: Tympanic membrane, ear canal and external ear normal.     Nose: Nose normal. No mucosal edema or rhinorrhea.     Mouth/Throat:     Pharynx: Uvula midline. No oropharyngeal exudate.  Eyes:     Conjunctiva/sclera:  Conjunctivae normal.  Neck:     Thyroid: No thyromegaly.     Trachea: Trachea normal. No tracheal tenderness or tracheal deviation.  Cardiovascular:     Rate and Rhythm: Normal rate and regular rhythm.     Heart sounds: Normal heart sounds, S1 normal and S2 normal. No murmur heard. Pulmonary:     Effort: No respiratory distress.     Breath sounds: Normal breath sounds. No stridor. No wheezing or rales.  Lymphadenopathy:     Head:     Right side of head: No tonsillar adenopathy.  Left side of head: No tonsillar adenopathy.     Cervical: No cervical adenopathy.  Skin:    Findings: No erythema or rash.     Nails: There is no clubbing.  Neurological:     Mental Status: She is alert.     Diagnostics:    Spirometry was performed and demonstrated an FEV1 of 1.62 at 75 % of predicted.  Assessment and Plan:   1. Asthma, moderate persistent, well-controlled   2. Other allergic rhinitis   3. LPRD (laryngopharyngeal reflux disease)      1. Continue Breztri - 2 inhalations 1-2 times per day   2. Continue omeprazole 80 mg in AM + 80 mg in PM + famotidine 20 mg in evening  3. Continue nasal fluticasone 1-2 spray each nostril 1 time per day  4. May use OTC Mucinex DM, azelastine, and ProAir HFA as needed  5. May use nystatin oral solution 1 teaspoon swish and swallow every morning  6. Return to clinic in 6 months or earlier if problem  Mihaela appears to be doing very well on her current therapy and there is an opportunity for her to use Breztri just 1 time per day and certainly she can increase to twice a day should it be required.  She will continue on nasal fluticasone for her upper airway disease and she will continue on her very aggressive therapy directed against reflux and LPR as noted above and she has a selection of agents that she can utilize should they be required.  Assuming she does well with this plan I will see her back in this clinic in 6 months or earlier if there is a  problem.  Laurette Schimke, MD Allergy / Immunology [facility]

## 2022-02-13 NOTE — Patient Instructions (Addendum)
   1. Continue Breztri - 2 inhalations 1-2 times per day   2. Continue omeprazole 80 mg in AM + 80 mg in PM + famotidine 20 mg in evening  3. Continue nasal fluticasone 1-2 spray each nostril 1 time per day  4. May use OTC Mucinex DM, azelastine, and ProAir HFA as needed  5. May use nystatin oral solution 1 teaspoon swish and swallow every morning  6. Return to clinic in 6 months or earlier if problem

## 2022-02-14 ENCOUNTER — Encounter: Payer: Self-pay | Admitting: Allergy and Immunology

## 2022-02-15 ENCOUNTER — Other Ambulatory Visit: Payer: Self-pay | Admitting: Allergy and Immunology

## 2022-02-27 ENCOUNTER — Telehealth: Payer: Self-pay | Admitting: Orthopedic Surgery

## 2022-02-27 ENCOUNTER — Ambulatory Visit (INDEPENDENT_AMBULATORY_CARE_PROVIDER_SITE_OTHER): Payer: PPO | Admitting: Allergy & Immunology

## 2022-02-27 ENCOUNTER — Encounter: Payer: Self-pay | Admitting: Allergy & Immunology

## 2022-02-27 VITALS — BP 120/70 | HR 68 | Temp 97.8°F | Resp 20 | Ht 68.0 in | Wt 117.5 lb

## 2022-02-27 DIAGNOSIS — J4540 Moderate persistent asthma, uncomplicated: Secondary | ICD-10-CM

## 2022-02-27 DIAGNOSIS — R0602 Shortness of breath: Secondary | ICD-10-CM

## 2022-02-27 DIAGNOSIS — J3089 Other allergic rhinitis: Secondary | ICD-10-CM | POA: Diagnosis not present

## 2022-02-27 DIAGNOSIS — J4541 Moderate persistent asthma with (acute) exacerbation: Secondary | ICD-10-CM

## 2022-02-27 DIAGNOSIS — K219 Gastro-esophageal reflux disease without esophagitis: Secondary | ICD-10-CM | POA: Diagnosis not present

## 2022-02-27 MED ORDER — FAMOTIDINE 20 MG PO TABS: 20.0000 mg | ORAL_TABLET | Freq: Every evening | ORAL | 5 refills | Status: DC

## 2022-02-27 MED ORDER — FLUTICASONE PROPIONATE 50 MCG/ACT NA SUSP: NASAL | 1 refills | Status: DC

## 2022-02-27 MED ORDER — AZELASTINE HCL 0.1 % NA SOLN: 2.0000 | Freq: Two times a day (BID) | NASAL | 5 refills | Status: DC

## 2022-02-28 NOTE — Telephone Encounter (Signed)
She can come in for x-rays to make sure she doesn't have shoulder fracture.  If not, should be good to press on

## 2022-03-01 NOTE — Telephone Encounter (Signed)
IC LMVM advising per Leane Call note. Advised patient to call and we could get her worked in for this.

## 2022-03-01 NOTE — Telephone Encounter (Signed)
She states that she had a spasm and fell and hit her coccyx and wants an xray on that, not the shoulder.

## 2022-03-05 ENCOUNTER — Ambulatory Visit: Payer: PPO | Admitting: Orthopedic Surgery

## 2022-03-05 ENCOUNTER — Ambulatory Visit (INDEPENDENT_AMBULATORY_CARE_PROVIDER_SITE_OTHER): Payer: PPO

## 2022-03-05 DIAGNOSIS — M533 Sacrococcygeal disorders, not elsewhere classified: Secondary | ICD-10-CM

## 2022-03-07 ENCOUNTER — Encounter: Payer: Self-pay | Admitting: Orthopedic Surgery

## 2022-03-07 NOTE — Progress Notes (Signed)
Office Visit Note   Patient: Victoria Trevino           Date of Birth: December 18, 1940           MRN: [id number] Visit Date: 03/05/2022 Requested by: Merlene Laughter, MD [address] [place],  [place] [address] PCP: Merlene Laughter, MD  Subjective: Chief Complaint  Patient presents with   Other    Sacral pain s/p fall 1 week ago    HPI: Victoria Trevino is an 81 year old patient who got an awful leg cramp in bed 1 week ago.  She slipped out of the bed and landed on her ischial tuberosity region.  Reports some sacral pain.  Has gotten a lot better with Tylenol.  Denies any groin pain.  She has been able to ambulate.  Her symptoms have improved 80%.  She is walking without assistive devices.  She has rotator cuff surgery coming up next week.              ROS: All systems reviewed are negative as they relate to the chief complaint within the history of present illness.  Patient denies  fevers or chills.   Assessment & Plan: Visit Diagnoses:  1. Sacral back pain     Plan: Impression is mild injury to the sacrococcygeal region which is improving quickly.  With any fracture I would not have expected this rate of improvement.  She is able to ambulate.  Radiographs are somewhat inconclusive about traumatic bony injury.  Plan to continue with weightbearing as tolerated and do not see a contraindication for her rotator cuff surgery next week.  Follow-Up Instructions: No follow-ups on file.   Orders:  Orders Placed This Encounter  Procedures   XR Sacrum/Coccyx   No orders of the defined types were placed in this encounter.     Procedures: No procedures performed   Clinical Data: No additional findings.  Objective: Vital Signs: There were no vitals taken for this visit.  Physical Exam:   Constitutional: Patient appears well-developed HEENT:  Head: Normocephalic Eyes:EOM are normal Neck: Normal range of motion Cardiovascular: Normal rate Pulmonary/chest: Effort  normal Neurologic: Patient is alert Skin: Skin is warm Psychiatric: Patient has normal mood and affect   Ortho Exam: Ortho exam demonstrates mild tenderness about 4 fingerbreadths above the coccyx.  No bruising in this area.  No nerve root tension signs.  No groin pain with internal/external rotation of the leg.  Pedal pulses palpable.  Patient is able to weight-bear on both lower extremities.  Specialty Comments:  No specialty comments available.  Imaging: No results found.   PMFS History: Patient Active Problem List   Diagnosis Date Noted   Age-related memory disorder 11/27/2021   Acute cough 08/04/2021   Not well controlled mild persistent asthma 08/04/2021   Other allergic rhinitis 08/04/2021   MCI (mild cognitive impairment) 01/12/2021   Asthma, well controlled 10/31/2017   Thrush, oral 10/31/2017   Left wrist injury 01/18/2017   Hereditary and idiopathic peripheral neuropathy 02/16/2016   LPRD (laryngopharyngeal reflux disease) 08/02/2015   Allergic rhinoconjunctivitis 08/02/2015   Moderate persistent asthma 08/02/2015   Past Medical History:  Diagnosis Date   Allergic rhinoconjunctivitis 08/02/2015   Allergies    Arthritis    hands   Asthma    Dr Laurette Schimke   Asthma, well controlled 10/31/2017   Blepharitis    GERD (gastroesophageal reflux disease)    Glaucoma    Hereditary and idiopathic peripheral neuropathy 02/16/2016   Hx  of fracture    Left wrist 2018; right patella (separate incident) 2018; used braces   Left wrist injury 01/18/2017   LPRD (laryngopharyngeal reflux disease) 08/02/2015   Macular degeneration    Memory loss    in study at [place], [place] 02/2020   Migraine    Moderate persistent asthma 08/02/2015   Osteopenia    Sensorimotor neuropathy    moderate-severe with chronic denervation- Dr Cyndi Lennert, oral 10/31/2017   Vertigo    occasional positional    Family History  Problem Relation Age of Onset   Alzheimer's disease Mother         Deceased, 55   Hypercholesterolemia Mother    Heart attack Father    Congestive Heart Failure Father        Deceased, 56   Emphysema Father        smoker   CAD Father    Benign prostatic hyperplasia Father    Skin cancer Sister    Allergic rhinitis Sister    Hypertension Sister    Hypercholesterolemia Sister    Obesity Sister    Irritable bowel syndrome Sister    Aneurysm Brother    Skin cancer Brother        basal cell   Memory loss Brother    COPD Brother    Congestive Heart Failure Brother    Emphysema Brother    Stroke Maternal Grandmother    Dementia Maternal Grandmother    Prostate cancer Maternal Grandfather    Dementia Maternal Grandfather    Diabetes Son    Hypertension Son    Diabetes Daughter    Asthma Daughter    Stroke Paternal Grandmother    CAD Paternal Grandfather     Past Surgical History:  Procedure Laterality Date   BUNIONECTOMY Right    Right great toe and little toe, pins in both, [place], [place]   CATARACT EXTRACTION, BILATERAL     with implants; R eye 08/2017 and L eye 09/2017   Cervical decompression and fusion  1997   COLONOSCOPY     2003, 2008, 2013   ECTOPIC PREGNANCY SURGERY     injection laryngeal plasty 2008     per [facility]   KNEE SURGERY Left    meniscus @ [employer], [place], [place]   nissan infundibulm     SALPINGECTOMY Right    [place], [place]   THROAT SURGERY     vocal cords injected with liquid bone  2005   [facility], [place]   Social History   Occupational History   Not on file  Tobacco Use   Smoking status: Never   Smokeless tobacco: Never  Vaping Use   Vaping Use: Never used  Substance and Sexual Activity   Alcohol use: Not Currently    Comment: was a glass of wine one time per year   Drug use: Never   Sexual activity: Yes    Birth control/protection: Condom

## 2022-03-09 ENCOUNTER — Encounter (HOSPITAL_COMMUNITY): Payer: Self-pay | Admitting: Orthopedic Surgery

## 2022-03-10 ENCOUNTER — Other Ambulatory Visit: Payer: Self-pay | Admitting: Neurology

## 2022-03-10 ENCOUNTER — Other Ambulatory Visit: Payer: Self-pay | Admitting: Allergy and Immunology

## 2022-03-12 ENCOUNTER — Other Ambulatory Visit: Payer: Self-pay

## 2022-03-12 ENCOUNTER — Encounter (HOSPITAL_COMMUNITY): Payer: Self-pay | Admitting: Orthopedic Surgery

## 2022-03-12 NOTE — Anesthesia Preprocedure Evaluation (Addendum)
Anesthesia Evaluation Anesthesia Physical Anesthesia Plan  ASA:   Anesthesia Plan:    Post-op Pain Management:    Induction:   PONV Risk Score and Plan:   Airway Management Planned:   Additional Equipment:   Intra-op Plan:   Post-operative Plan:   Informed Consent:   Plan Discussed with:   Anesthesia Plan Comments: (PAT note written 03/12/2022 by Shonna Chock, PA-C. )        Anesthesia Quick Evaluation

## 2022-03-12 NOTE — Pre-Procedure Instructions (Signed)
[facility] - [place], [place] - [address] [place] [address] Phone: [phone] Fax: [phone]  [facility] - [place], [place] - [address] [place] [address] Phone: [phone] Fax: [phone]  PCP - Merlene Laughter, MD Cardiologist -Jake Bathe, MD   Chest x-ray - 08/04/21 EKG - 06/21/21 ECHO - 02/16/19  Aspirin Instructions: Stopping today 03/12/22 ERAS Protcol - Clears until 1030  Anesthesia review: Y  Patient verbally denies any shortness of breath, fever, cough and chest pain during phone call   -------------  SDW INSTRUCTIONS given:  Your procedure is scheduled on 03/13/22.  Report to [facility] Main Entrance "A" at 1100 A.M., and check in at the Admitting office.  Call this number if you have problems the morning of surgery:  [phone]   Remember:  Do not eat after midnight the night before your surgery  You may drink clear liquids until 1030 the morning of your surgery.   Clear liquids allowed are: Water, Non-Citrus Juices (without pulp), Carbonated Beverages, Clear Tea, Black Coffee Only, and Gatorade    Take these medicines the morning of surgery with A SIP OF WATER  acetaminophen (TYLENOL)  azelastine (ASTELIN)  BREZTRI AEROSPHERE omeprazole (PRILOSEC) SIMPLY SALINE  SYSTANE-if needed albuterol (PROVENTIL HFA)-if needed (Please bring the day of surgery)   As of today, STOP taking any Aspirin (unless otherwise instructed by your surgeon) Aleve, Naproxen, Ibuprofen, Motrin, Advil, Goody's, BC's, all herbal medications, fish oil, and all vitamins.                      Do not wear jewelry, make up, or nail polish            Do not wear lotions, powders, perfumes/colognes, or deodorant.            Do not shave 48 hours prior to surgery.  Men may shave face and neck.            Do not bring valuables to the  hospital.            [facility] is not responsible for any belongings or valuables.  Do NOT Smoke (Tobacco/Vaping) 24 hours prior to your procedure If you use a CPAP at night, you may bring all equipment for your overnight stay.   Contacts, glasses, dentures or bridgework may not be worn into surgery.      For patients admitted to the hospital, discharge time will be determined by your treatment team.   Patients discharged the day of surgery will not be allowed to drive home, and someone needs to stay with them for 24 hours.    Special instructions:   [place]- Preparing For Surgery  Before surgery, you can play an important role. Because skin is not sterile, your skin needs to be as free of germs as possible. You can reduce the number of germs on your skin by washing with CHG (chlorahexidine gluconate) Soap before surgery.  CHG is an antiseptic cleaner which kills germs and bonds with the skin to continue killing germs even after washing.    Oral Hygiene is also important to reduce your risk of infection.  Remember - BRUSH YOUR TEETH THE MORNING OF SURGERY WITH YOUR REGULAR TOOTHPASTE  Please do not use if you have an allergy to CHG or antibacterial soaps. If your skin becomes reddened/irritated stop using the  CHG.  Do not shave (including legs and underarms) for at least 48 hours prior to first CHG shower. It is OK to shave your face.  Please follow these instructions carefully.   Shower the NIGHT BEFORE SURGERY and the MORNING OF SURGERY with DIAL Soap.   Pat yourself dry with a CLEAN TOWEL.  Wear CLEAN PAJAMAS to bed the night before surgery  Place CLEAN SHEETS on your bed the night of your first shower and DO NOT SLEEP WITH PETS.   Day of Surgery: Please shower morning of surgery  Wear Clean/Comfortable clothing the morning of surgery Do not apply any deodorants/lotions.   Remember to brush your teeth WITH YOUR REGULAR TOOTHPASTE.   Questions were answered. Patient  verbalized understanding of instructions.

## 2022-03-12 NOTE — Progress Notes (Signed)
Anesthesia Chart Review: SAME DAY WORK-UP  Case: [id number] Date/Time: 03/13/22 1314   Procedure: LEFT SHOULDER ARTHROSCOPY, WITH MINI OPEN ROTATOR CUFF TEAR REPAIR & BICEPS TENODESIS (Left)   Anesthesia type: General   Pre-op diagnosis: left shoulder rotator cuff tear with biceps tendinosis   Location: [facility] OR ROOM 06 / [facility] OR   Surgeons: Cammy Copa, MD       DISCUSSION: Patient is an 81 year old female scheduled for the above procedure.  History includes never smoker, CAD (mild non-obstructive CAD with 0-25% pRCA & OM1, 25-50% mid-distal RCA by 02/2019 CCTA), asthma, laryngeal pharyngeal reflux disease, GERD (history Nissen fundoplication & hiatal hernia repair 08/15/04), idiopathic peripheral neuropathy, vertigo, memory loss, glaucoma, macular degeneration, cervical fusion (1997).  Last cardiology follow-up was on 06/21/21 with Gillian Shields, NP. She wrote, "Stable with no anginal symptoms. No indication for ischemic evaluation.  GDMT incudes rosuvastatin. No BB due to baseline bradycardia. Heart healthy diet and regular cardiovascular exercise encouraged." One year follow-up planned.     She had allergy follow-up by Dr. Malachi Bonds on 02/27/22 who wrote, "Moderate persistent asthma with acute exacerbation - Lung testing looks slightly worse today, but it improved with albuterol treatment.  - COVID testing was negative today, which is good. - We are going to start you on a prednisone burst to get things under control before your surgery in a couple weeks. - I would bump the Breztri back to 2 puffs twice a day at least through your surgery to make sure that you do not have any more breakthrough symptoms."  Last ASA 03/12/22. She denied SOB, cough, fever, chest pain per PAT RN phone interview. She is a same day work-up, so further anesthesia team evaluation on the day of surgery.   VS: Ht 5\' 8"  (1.727 m)   Wt 53.3 kg   BMI 17.87 kg/m  BP Readings from Last 3 Encounters:  02/27/22  120/70  02/13/22 116/66  11/23/21 121/66   Pulse Readings from Last 3 Encounters:  02/27/22 68  02/13/22 60  11/23/21 65     PROVIDERS: 11/25/21, MD is PCP  Merlene Laughter, MD is cardiologist. Initially seen 01/19/19 for DOE. Testing as below. Last visit as above. 01/21/19, MD is allergist Laurette Schimke, MD is neurologist. Last visit 11/23/21. She notes patient has been involved with the Healthy Brain Study since 2015 and had extensive testing at [facility]. She also reviewed 2016, PsyD notes, "essentially he did not contact neurodegenerative disorder, overall the results were quite positive, patient's MMSE is stable, we discussed in detail, I reassured patient, she is to see Dr. Arley Phenix again in 9 months." Follow-up as needed.    LABS: For day of surgery as indicated. Comparison labs in [facility] show H/H 13.4/40.6, PLT 198 on 02/16/21 and Cr 0.77, glucose 69, ALT 38, AST 33 on 02/07/21.   IMAGES: MRI Left Shoulder 01/10/22: IMPRESSION: 1. Rotator cuff tendinosis with focal full-thickness mildly retracted tear of the posterior supraspinatus tendon. 2. Intra-articular biceps tendinosis. 3. Possible partial-thickness labral tear underlying the biceps anchor site.   CXR 08/04/21: FINDINGS: - Heart size and mediastinal contours are within normal limits. Stable calcified density to the left of the aortic arch. No suspicious pulmonary opacities identified. - No pleural effusion or pneumothorax visualized. - No acute osseous abnormality appreciated.  IMPRESSION: No acute intrathoracic process identified.   EKG: 06/21/21: NSR. Negative T wave in III.   CV: Echo 02/16/19  IMPRESSIONS   1. The left  ventricle has normal systolic function, with an ejection  fraction of 55-60%. The cavity size was normal. Left ventricular diastolic  parameters were normal.   2. Normal GLS -19.6.   3. The right ventricle has normal systolic function. The cavity was   normal. There is no increase in right ventricular wall thickness.   4. The mitral valve is degenerative. Moderate thickening of the mitral  valve leaflet. Mild calcification of the mitral valve leaflet.   5. The aortic valve is tricuspid. Moderate thickening of the aortic  valve. Sclerosis without any evidence of stenosis of the aortic valve.  Aortic valve regurgitation is mild by color flow Doppler.     Cardiac CTA 02/09/19 FINDINGS: A 120 kV prospective scan was triggered in the descending thoracic aorta at 111 HU's. Axial non-contrast 3 mm slices were carried out through the heart. The data set was analyzed on a dedicated work station and scored using the Agatson method. Gantry rotation speed was 250 msecs and collimation was .6 mm. 100 mg of PO metoprolol and 0.8 mg of sl NTG was given. The 3D data set was reconstructed in 5% intervals of the 67-82 % of the R-R cycle. Diastolic phases were analyzed on a dedicated work station using MPR, MIP and VRT modes. The patient received 80 cc of contrast.   Aorta: Normal size. Trivial atherosclerotic plaque. No dissection.   Aortic Valve:  Trileaflet.  No calcifications.   Coronary Arteries:  Normal coronary origin.  Right dominance.   RCA is a large dominant artery that gives rise to PDA and PLVB. There is minimal calcified plaque in the proximal RCA with stenosis 0-25%. Mid and distal RCA has a mild non-calcified plaque with stenosis 25-50%.   PDA is a very small.   PLA has no obvious plaque.   Left main is a large and long artery that gives rise to LAD and LCX arteries. Left main has no plaque.   LAD is a large vessel that gives rise to two small diagonal arteries, wraps around the apex where is supplies part of PDA territory. There is minimal diffuse plaque with maximum stenosis 0-25%.   LCX is a non-dominant artery that gives rise to one large OM1 branch. There is minimal calcified plaque with stenosis 0-25%.   Other  findings:   Normal pulmonary vein drainage into the left atrium.   Normal let atrial appendage without a thrombus.   Normal size of the pulmonary artery.   IMPRESSION: 1. Coronary calcium score of 74. This was 48 percentile for age and sex matched control.   2. Normal coronary origin with right dominance.   3. Mild non-obstructive CAD. Risk factor modification is recommended.    US Carotid 10/27/18 Summary:  - Right Carotid: There is no evidence of stenosis in the right ICA.  - Left Carotid: There is no evidence of stenosis in the left ICA.  - Vertebrals:  Bilateral vertebral arteries demonstrate antegrade flow.  - Subclavians: Normal flow hemodynamics were seen in bilateral subclavian arteries.    Past Medical History:  Diagnosis Date   Allergic rhinoconjunctivitis 08/02/2015   Allergies    Arthritis    hands   Asthma    Dr Laurette Schimke   Asthma, well controlled 10/31/2017   Blepharitis    Coronary artery disease 06/21/2021   GERD (gastroesophageal reflux disease)    Glaucoma    Hereditary and idiopathic peripheral neuropathy 02/16/2016   Hx of fracture    Left wrist 2018; right patella (  separate incident) 2018; used braces   Left wrist injury 01/18/2017   LPRD (laryngopharyngeal reflux disease) 08/02/2015   Macular degeneration    Memory loss    in study at [place], [place] 02/2020   Migraine    Moderate persistent asthma 08/02/2015   Osteopenia    Sensorimotor neuropathy    moderate-severe with chronic denervation- Dr Cyndi Lennert, oral 10/31/2017   Vertigo    occasional positional    Past Surgical History:  Procedure Laterality Date   BUNIONECTOMY Right    Right great toe and little toe, pins in both, [place], [place]   CATARACT EXTRACTION, BILATERAL     with implants; R eye 08/2017 and L eye 09/2017   Cervical decompression and fusion  1997   COLONOSCOPY     2003, 2008, 2013   ECTOPIC PREGNANCY SURGERY     injection laryngeal plasty 2008      per notes from [facility]   KNEE SURGERY Left    meniscus @ [employer], [place], [place]   nissan infundibulm     SALPINGECTOMY Right    [place], [place]   THROAT SURGERY     vocal cords injected with liquid bone  2005   [place], [place]    MEDICATIONS: No current facility-administered medications for this encounter.    acetaminophen (TYLENOL) 500 MG tablet   albuterol (PROVENTIL HFA) 108 (90 Base) MCG/ACT inhaler   aspirin EC 81 MG tablet   azelastine (ASTELIN) 0.1 % nasal spray   bimatoprost (LUMIGAN) 0.01 % SOLN   BREZTRI AEROSPHERE 160-9-4.8 MCG/ACT AERO   Calcium Carbonate-Vitamin D 600-200 MG-UNIT TABS   Cholecalciferol (VITAMIN D) 50 MCG (2000 UT) tablet   famotidine (PEPCID) 20 MG tablet   fluconazole (DIFLUCAN) 100 MG tablet   fluticasone (FLONASE) 50 MCG/ACT nasal spray   Multiple Vitamins-Minerals (PRESERVISION AREDS 2+MULTI VIT PO)   Nutritional Supplements (JUICE PLUS FIBRE PO)   Nutritional Supplements (JUICE PLUS FIBRE PO)   nystatin (MYCOSTATIN) 100000 UNIT/ML suspension   Polyethyl Glycol-Propyl Glycol (SYSTANE OP)   rosuvastatin (CRESTOR) 5 MG tablet   SIMPLY SALINE NA   donepezil (ARICEPT) 10 MG tablet   omeprazole (PRILOSEC) 40 MG capsule    Shonna Chock, PA-C Surgical Short Stay/Anesthesiology [facility] Phone [phone] [facility] Phone [phone] 03/12/2022 11:41 AM

## 2022-03-13 ENCOUNTER — Encounter (HOSPITAL_COMMUNITY)
Admission: RE | Disposition: A | Discharge status: Home / Self Care | Payer: Self-pay | Source: Home / Self Care | Attending: Orthopedic Surgery

## 2022-03-13 ENCOUNTER — Other Ambulatory Visit: Payer: Self-pay

## 2022-03-13 ENCOUNTER — Ambulatory Visit (HOSPITAL_COMMUNITY)
Admission: RE | Admit: 2022-03-13 | Discharge: 2022-03-13 | Disposition: A | Discharge status: Home / Self Care | Payer: PPO | Attending: Orthopedic Surgery | Admitting: Orthopedic Surgery

## 2022-03-13 ENCOUNTER — Ambulatory Visit (HOSPITAL_COMMUNITY): Payer: PPO | Admitting: Vascular Surgery

## 2022-03-13 ENCOUNTER — Encounter (HOSPITAL_COMMUNITY): Payer: Self-pay | Admitting: Orthopedic Surgery

## 2022-03-13 ENCOUNTER — Ambulatory Visit (HOSPITAL_BASED_OUTPATIENT_CLINIC_OR_DEPARTMENT_OTHER): Payer: PPO | Admitting: Vascular Surgery

## 2022-03-13 DIAGNOSIS — R413 Other amnesia: Secondary | ICD-10-CM

## 2022-03-13 DIAGNOSIS — M7522 Bicipital tendinitis, left shoulder: Secondary | ICD-10-CM

## 2022-03-13 DIAGNOSIS — M75122 Complete rotator cuff tear or rupture of left shoulder, not specified as traumatic: Secondary | ICD-10-CM | POA: Diagnosis not present

## 2022-03-13 DIAGNOSIS — J45909 Unspecified asthma, uncomplicated: Secondary | ICD-10-CM | POA: Diagnosis not present

## 2022-03-13 DIAGNOSIS — K219 Gastro-esophageal reflux disease without esophagitis: Secondary | ICD-10-CM | POA: Diagnosis not present

## 2022-03-13 DIAGNOSIS — M65812 Other synovitis and tenosynovitis, left shoulder: Secondary | ICD-10-CM

## 2022-03-13 DIAGNOSIS — G8918 Other acute postprocedural pain: Secondary | ICD-10-CM | POA: Diagnosis not present

## 2022-03-13 DIAGNOSIS — X58XXXA Exposure to other specified factors, initial encounter: Secondary | ICD-10-CM | POA: Diagnosis not present

## 2022-03-13 DIAGNOSIS — M75102 Unspecified rotator cuff tear or rupture of left shoulder, not specified as traumatic: Secondary | ICD-10-CM | POA: Diagnosis not present

## 2022-03-13 DIAGNOSIS — S46012A Strain of muscle(s) and tendon(s) of the rotator cuff of left shoulder, initial encounter: Secondary | ICD-10-CM | POA: Diagnosis not present

## 2022-03-13 DIAGNOSIS — Z7982 Long term (current) use of aspirin: Secondary | ICD-10-CM | POA: Insufficient documentation

## 2022-03-13 HISTORY — PX: SHOULDER ARTHROSCOPY WITH OPEN ROTATOR CUFF REPAIR AND DISTAL CLAVICLE ACROMINECTOMY: SHX5683

## 2022-03-13 LAB — BASIC METABOLIC PANEL
Anion gap: 10 (ref 5–15)
BUN: 24 mg/dL — ABNORMAL HIGH (ref 8–23)
CO2: 22 mmol/L (ref 22–32)
Calcium: 9.4 mg/dL (ref 8.9–10.3)
Chloride: 108 mmol/L (ref 98–111)
Creatinine, Ser: 0.89 mg/dL (ref 0.44–1.00)
GFR, Estimated: >60 mL/min (ref >60)
Glucose, Bld: 81 mg/dL (ref 70–99)
Potassium: 4.3 mmol/L (ref 3.5–5.1)
Sodium: 140 mmol/L (ref 135–145)

## 2022-03-13 LAB — CBC
HCT: 44 % (ref 36.0–46.0)
Hemoglobin: 13.8 g/dL (ref 12.0–15.0)
MCH: 32.5 pg (ref 26.0–34.0)
MCHC: 31.4 g/dL (ref 30.0–36.0)
MCV: 103.5 fL — ABNORMAL HIGH (ref 80.0–100.0)
Platelets: 156 10*3/uL (ref 150–400)
RBC: 4.25 MIL/uL (ref 3.87–5.11)
RDW: 13.2 % (ref 11.5–15.5)
WBC: 5.9 10*3/uL (ref 4.0–10.5)
nRBC: 0 % (ref 0.0–0.2)

## 2022-03-13 SURGERY — SHOULDER ARTHROSCOPY WITH OPEN ROTATOR CUFF REPAIR AND DISTAL CLAVICLE ACROMINECTOMY
Anesthesia: General | Laterality: Left

## 2022-03-13 MED ORDER — SUGAMMADEX SODIUM 200 MG/2ML IV SOLN
INTRAVENOUS | Status: DC | PRN
  Administered 2022-03-13: 200 mg via INTRAVENOUS

## 2022-03-13 MED ORDER — CHLORHEXIDINE GLUCONATE 0.12 % MT SOLN
15.0000 mL | Freq: Once | OROMUCOSAL | Status: AC
Start: 2022-03-13 — End: 2022-03-13
  Administered 2022-03-13: 15 mL via OROMUCOSAL
  Filled 2022-03-13: qty 15

## 2022-03-13 MED ORDER — ROCURONIUM BROMIDE 10 MG/ML (PF) SYRINGE
PREFILLED_SYRINGE | INTRAVENOUS | Status: AC
  Filled 2022-03-13: qty 10

## 2022-03-13 MED ORDER — FENTANYL CITRATE (PF) 250 MCG/5ML IJ SOLN
INTRAMUSCULAR | Status: AC
  Filled 2022-03-13: qty 5

## 2022-03-13 MED ORDER — ROPIVACAINE HCL 5 MG/ML IJ SOLN
INTRAMUSCULAR | Status: DC | PRN
  Administered 2022-03-13: 25 mL via PERINEURAL

## 2022-03-13 MED ORDER — ONDANSETRON HCL 4 MG/2ML IJ SOLN
INTRAMUSCULAR | Status: AC
  Administered 2022-03-13: 4 mg via INTRAVENOUS
  Filled 2022-03-13: qty 2

## 2022-03-13 MED ORDER — HYDROCODONE-ACETAMINOPHEN 5-325 MG PO TABS: 1.0000 | ORAL_TABLET | ORAL | 0 refills | Status: DC | PRN

## 2022-03-13 MED ORDER — LACTATED RINGERS IV SOLN: INTRAVENOUS | Status: DC

## 2022-03-13 MED ORDER — FENTANYL CITRATE (PF) 100 MCG/2ML IJ SOLN
INTRAMUSCULAR | Status: AC
  Administered 2022-03-13: 50 ug via INTRAVENOUS
  Filled 2022-03-13: qty 2

## 2022-03-13 MED ORDER — DEXAMETHASONE SODIUM PHOSPHATE 10 MG/ML IJ SOLN
INTRAMUSCULAR | Status: DC | PRN
  Administered 2022-03-13: 5 mg via INTRAVENOUS

## 2022-03-13 MED ORDER — PHENYLEPHRINE 80 MCG/ML (10ML) SYRINGE FOR IV PUSH (FOR BLOOD PRESSURE SUPPORT)
PREFILLED_SYRINGE | INTRAVENOUS | Status: AC
  Filled 2022-03-13: qty 10

## 2022-03-13 MED ORDER — LIDOCAINE 2% (20 MG/ML) 5 ML SYRINGE
INTRAMUSCULAR | Status: AC
Start: 2022-03-13 — End: ?
  Filled 2022-03-13: qty 5

## 2022-03-13 MED ORDER — CLONIDINE HCL (ANALGESIA) 100 MCG/ML EP SOLN
EPIDURAL | Status: DC | PRN
  Administered 2022-03-13: 100 ug

## 2022-03-13 MED ORDER — ORAL CARE MOUTH RINSE: 15.0000 mL | Freq: Once | OROMUCOSAL | Status: AC

## 2022-03-13 MED ORDER — EPHEDRINE 5 MG/ML INJ
INTRAVENOUS | Status: AC
  Filled 2022-03-13: qty 5

## 2022-03-13 MED ORDER — POVIDONE-IODINE 7.5 % EX SOLN
Freq: Once | CUTANEOUS | Status: DC
  Filled 2022-03-13: qty 118

## 2022-03-13 MED ORDER — FENTANYL CITRATE (PF) 100 MCG/2ML IJ SOLN: 50.0000 ug | Freq: Once | INTRAMUSCULAR | Status: AC

## 2022-03-13 MED ORDER — PHENYLEPHRINE HCL-NACL 20-0.9 MG/250ML-% IV SOLN
INTRAVENOUS | Status: DC | PRN
  Administered 2022-03-13: 25 ug/min via INTRAVENOUS

## 2022-03-13 MED ORDER — CEFAZOLIN SODIUM-DEXTROSE 2-4 GM/100ML-% IV SOLN
2.0000 g | INTRAVENOUS | Status: AC
  Administered 2022-03-13: 2 g via INTRAVENOUS
  Filled 2022-03-13: qty 100

## 2022-03-13 MED ORDER — LIDOCAINE 2% (20 MG/ML) 5 ML SYRINGE
INTRAMUSCULAR | Status: DC | PRN
  Administered 2022-03-13: 100 mg via INTRAVENOUS

## 2022-03-13 MED ORDER — EPHEDRINE SULFATE-NACL 50-0.9 MG/10ML-% IV SOSY
PREFILLED_SYRINGE | INTRAVENOUS | Status: DC | PRN
  Administered 2022-03-13 (×2): 5 mg via INTRAVENOUS

## 2022-03-13 MED ORDER — EPINEPHRINE PF 1 MG/ML IJ SOLN
INTRAMUSCULAR | Status: AC
  Filled 2022-03-13: qty 1

## 2022-03-13 MED ORDER — VANCOMYCIN HCL 1000 MG IV SOLR
INTRAVENOUS | Status: DC | PRN
  Administered 2022-03-13: 1000 mg via TOPICAL

## 2022-03-13 MED ORDER — 0.9 % SODIUM CHLORIDE (POUR BTL) OPTIME
TOPICAL | Status: DC | PRN
  Administered 2022-03-13: 1000 mL

## 2022-03-13 MED ORDER — ALBUMIN HUMAN 5 % IV SOLN: INTRAVENOUS | Status: DC | PRN

## 2022-03-13 MED ORDER — MIDAZOLAM HCL 2 MG/2ML IJ SOLN
INTRAMUSCULAR | Status: AC
  Filled 2022-03-13: qty 2

## 2022-03-13 MED ORDER — TRANEXAMIC ACID-NACL 1000-0.7 MG/100ML-% IV SOLN
1000.0000 mg | INTRAVENOUS | Status: AC
  Administered 2022-03-13: 1000 mg via INTRAVENOUS
  Filled 2022-03-13: qty 100

## 2022-03-13 MED ORDER — DEXAMETHASONE SODIUM PHOSPHATE 10 MG/ML IJ SOLN
INTRAMUSCULAR | Status: AC
Start: 2022-03-13 — End: ?
  Filled 2022-03-13: qty 1

## 2022-03-13 MED ORDER — ONDANSETRON HCL 4 MG/2ML IJ SOLN
INTRAMUSCULAR | Status: DC | PRN
  Administered 2022-03-13: 4 mg via INTRAVENOUS

## 2022-03-13 MED ORDER — ONDANSETRON HCL 4 MG/2ML IJ SOLN
INTRAMUSCULAR | Status: AC
Start: 2022-03-13 — End: ?
  Filled 2022-03-13: qty 2

## 2022-03-13 MED ORDER — BUPIVACAINE HCL (PF) 0.25 % IJ SOLN
INTRAMUSCULAR | Status: AC
  Filled 2022-03-13: qty 30

## 2022-03-13 MED ORDER — POVIDONE-IODINE 10 % EX SWAB
2.0000 | Freq: Once | CUTANEOUS | Status: AC
Start: 2022-03-13 — End: 2022-03-13
  Administered 2022-03-13: 2 via TOPICAL

## 2022-03-13 MED ORDER — ONDANSETRON HCL 4 MG/2ML IJ SOLN: 4.0000 mg | Freq: Four times a day (QID) | INTRAMUSCULAR | Status: DC | PRN

## 2022-03-13 MED ORDER — METHOCARBAMOL 500 MG PO TABS: 500.0000 mg | ORAL_TABLET | Freq: Three times a day (TID) | ORAL | 1 refills | Status: DC | PRN

## 2022-03-13 MED ORDER — PROPOFOL 10 MG/ML IV BOLUS
INTRAVENOUS | Status: DC | PRN
  Administered 2022-03-13: 100 mg via INTRAVENOUS

## 2022-03-13 MED ORDER — ROCURONIUM BROMIDE 10 MG/ML (PF) SYRINGE
PREFILLED_SYRINGE | INTRAVENOUS | Status: DC | PRN
  Administered 2022-03-13: 15 mg via INTRAVENOUS
  Administered 2022-03-13: 60 mg via INTRAVENOUS

## 2022-03-13 MED ORDER — VANCOMYCIN HCL 1000 MG IV SOLR
INTRAVENOUS | Status: AC
  Filled 2022-03-13: qty 20

## 2022-03-13 MED ORDER — SODIUM CHLORIDE 0.9 % IR SOLN
Status: DC | PRN
  Administered 2022-03-13: 3001 mL

## 2022-03-13 SURGICAL SUPPLY — 77 items
AID PSTN UNV HD RSTRNT DISP (MISCELLANEOUS) ×1
ANCH SUT 2 FBRTK KNTLS 1.8 (Anchor) ×1 IMPLANT
ANCH SUT 2 SWLK 19.1 CLS EYLT (Anchor) ×2 IMPLANT
ANCH SUT FBRTK 1.3 2 TPE (Anchor) ×2 IMPLANT
ANCHOR FBRTK 2.6 SUTURETAP 1.3 (Anchor) ×2 IMPLANT
ANCHOR SUT 1.8 FIBERTAK SB KL (Anchor) ×1 IMPLANT
ANCHOR SWIVELOCK BIO 4.75X19.1 (Anchor) ×2 IMPLANT
BAG COUNTER SPONGE SURGICOUNT (BAG) ×3 IMPLANT
BAG SPNG CNTER NS LX DISP (BAG) ×1
BLADE EXCALIBUR 4.0X13 (MISCELLANEOUS) IMPLANT
BLADE SURG 11 STRL SS (BLADE) ×3 IMPLANT
BUR OVAL 6.0 (BURR) IMPLANT
CLSR STERI-STRIP ANTIMIC 1/2X4 (GAUZE/BANDAGES/DRESSINGS) ×1 IMPLANT
COVER SURGICAL LIGHT HANDLE (MISCELLANEOUS) ×3 IMPLANT
DRAPE INCISE IOBAN 66X45 STRL (DRAPES) ×6 IMPLANT
DRAPE STERI 35X30 U-POUCH (DRAPES) ×2 IMPLANT
DRAPE U-SHAPE 47X51 STRL (DRAPES) ×6 IMPLANT
DRSG PAD ABDOMINAL 8X10 ST (GAUZE/BANDAGES/DRESSINGS) ×6 IMPLANT
DRSG TEGADERM 4X4.75 (GAUZE/BANDAGES/DRESSINGS) ×12 IMPLANT
DURAPREP 26ML APPLICATOR (WOUND CARE) ×3 IMPLANT
ELECT REM PT RETURN 9FT ADLT (ELECTROSURGICAL) ×2
ELECTRODE REM PT RTRN 9FT ADLT (ELECTROSURGICAL) ×2 IMPLANT
FILTER STRAW FLUID ASPIR (MISCELLANEOUS) ×3 IMPLANT
GAUZE SPONGE 4X4 12PLY STRL (GAUZE/BANDAGES/DRESSINGS) ×1 IMPLANT
GAUZE SPONGE 4X4 12PLY STRL LF (GAUZE/BANDAGES/DRESSINGS) ×2 IMPLANT
GAUZE XEROFORM 1X8 LF (GAUZE/BANDAGES/DRESSINGS) ×3 IMPLANT
GLOVE BIOGEL PI IND STRL 8 (GLOVE) ×2 IMPLANT
GLOVE BIOGEL PI INDICATOR 8 (GLOVE) ×1
GLOVE ECLIPSE 8.0 STRL XLNG CF (GLOVE) ×3 IMPLANT
GOWN STRL REUS W/ TWL LRG LVL3 (GOWN DISPOSABLE) ×6 IMPLANT
GOWN STRL REUS W/TWL LRG LVL3 (GOWN DISPOSABLE) ×6
KIT BASIN OR (CUSTOM PROCEDURE TRAY) ×3 IMPLANT
KIT STR SPEAR 1.8 FBRTK DISP (KITS) ×1 IMPLANT
KIT TURNOVER KIT B (KITS) ×3 IMPLANT
MANIFOLD NEPTUNE II (INSTRUMENTS) ×3 IMPLANT
NDL HYPO 25X1 1.5 SAFETY (NEEDLE) ×2 IMPLANT
NDL SCORPION MULTI FIRE (NEEDLE) IMPLANT
NDL SPNL 18GX3.5 QUINCKE PK (NEEDLE) ×2 IMPLANT
NDL SUT 6 .5 CRC .975X.05 MAYO (NEEDLE) IMPLANT
NEEDLE HYPO 25X1 1.5 SAFETY (NEEDLE) ×2 IMPLANT
NEEDLE MAYO TAPER (NEEDLE)
NEEDLE SCORPION MULTI FIRE (NEEDLE) ×2 IMPLANT
NEEDLE SPNL 18GX3.5 QUINCKE PK (NEEDLE) ×2 IMPLANT
NS IRRIG 1000ML POUR BTL (IV SOLUTION) ×3 IMPLANT
PACK SHOULDER (CUSTOM PROCEDURE TRAY) ×3 IMPLANT
PAD ARMBOARD 7.5X6 YLW CONV (MISCELLANEOUS) ×6 IMPLANT
PORT APPOLLO RF 90DEGREE MULTI (SURGICAL WAND) IMPLANT
RESECTOR TORPEDO 4MM 13CM CVD (MISCELLANEOUS) ×1 IMPLANT
RESTRAINT HEAD UNIVERSAL NS (MISCELLANEOUS) ×3 IMPLANT
SLING ARM IMMOBILIZER MED (SOFTGOODS) ×1 IMPLANT
SPONGE T-LAP 18X18 ~~LOC~~+RFID (SPONGE) ×1 IMPLANT
SPONGE T-LAP 4X18 ~~LOC~~+RFID (SPONGE) ×6 IMPLANT
STRIP CLOSURE SKIN 1/2X4 (GAUZE/BANDAGES/DRESSINGS) ×2 IMPLANT
SUCTION FRAZIER HANDLE 10FR (MISCELLANEOUS) ×2
SUCTION TUBE FRAZIER 10FR DISP (MISCELLANEOUS) ×2 IMPLANT
SUT ETHILON 3 0 PS 1 (SUTURE) ×2 IMPLANT
SUT FIBERWIRE #2 38 T-5 BLUE (SUTURE)
SUT FIBERWIRE 2-0 18 17.9 3/8 (SUTURE) ×8
SUT MNCRL AB 3-0 PS2 18 (SUTURE) ×2 IMPLANT
SUT VIC AB 0 CT1 27 (SUTURE)
SUT VIC AB 0 CT1 27XBRD ANBCTR (SUTURE) ×2 IMPLANT
SUT VIC AB 1 CT1 27 (SUTURE)
SUT VIC AB 1 CT1 27XBRD ANBCTR (SUTURE) ×2 IMPLANT
SUT VIC AB 2-0 CT1 27 (SUTURE)
SUT VIC AB 2-0 CT1 TAPERPNT 27 (SUTURE) ×2 IMPLANT
SUT VICRYL 0 UR6 27IN ABS (SUTURE) IMPLANT
SUTURE FIBERWR #2 38 T-5 BLUE (SUTURE) IMPLANT
SUTURE FIBERWR 2-0 18 17.9 3/8 (SUTURE) IMPLANT
SYR 20ML LL LF (SYRINGE) ×6 IMPLANT
SYR 3ML LL SCALE MARK (SYRINGE) ×3 IMPLANT
SYR TB 1ML LUER SLIP (SYRINGE) ×3 IMPLANT
SYS FBRTK BUTTON 2.6 (Anchor) ×2 IMPLANT
SYSTEM FBRTK BUTTON 2.6 (Anchor) IMPLANT
TOWEL GREEN STERILE (TOWEL DISPOSABLE) ×3 IMPLANT
TOWEL GREEN STERILE FF (TOWEL DISPOSABLE) ×3 IMPLANT
TUBING ARTHROSCOPY IRRIG 16FT (MISCELLANEOUS) ×3 IMPLANT
WATER STERILE IRR 1000ML POUR (IV SOLUTION) ×3 IMPLANT

## 2022-03-13 NOTE — Progress Notes (Signed)
Patient complaining of nausea; patient received Zofran 4 mg IV per Dr. Okey Dupre verbal order. Will continue to monitor.

## 2022-03-13 NOTE — Transfer of Care (Signed)
Immediate Anesthesia Transfer of Care Note  Patient: Victoria Trevino  Procedure(s) Performed: LEFT SHOULDER ARTHROSCOPY, WITH MINI OPEN ROTATOR CUFF TEAR REPAIR & BICEPS TENODESIS (Left)  Patient Location: PACU  Anesthesia Type:General  Level of Consciousness: awake and drowsy  Airway & Oxygen Therapy: Patient Spontanous Breathing and Patient connected to nasal cannula oxygen  Post-op Assessment: Report given to RN and Post -op Vital signs reviewed and stable  Post vital signs: Reviewed and stable  Last Vitals:  Vitals Value Taken Time  BP 99/40 03/13/22 1604  Temp    Pulse 67 03/13/22 1606  Resp 17 03/13/22 1606  SpO2 96 % 03/13/22 1606  Vitals shown include unvalidated device data.  Last Pain:  Vitals:   03/13/22 1255  TempSrc:   PainSc: 0-No pain         Complications: No notable events documented.

## 2022-03-13 NOTE — H&P (Signed)
Victoria Trevino is an 81 y.o. female.   Chief Complaint: Left shoulder pain HPI: Victoria Trevino is a 81 y.o. female who presents with left shoulder pain.  MRI scan shows rotator cuff tear.. Patient denies any changes in symptoms.  Continues to complain mainly of left shoulder pain.  Pain bothers her at night but does not necessarily wake her up at night.  She does a lot of yard work and this gets in the way of her doing what she wants to do.  She is very active and exercises but cannot do any exercising with this left shoulder pain.  She is right-hand dominant.  She has no history of prior surgery to the shoulder.  Medical history significant for GERD and asthma with no history of heart disease, blood thinner use, smoking, diabetes, CKD, osteoporosis.  She takes aspirin.  She lives at home alone but her son is her next-door neighbor  Past Medical History:  Diagnosis Date   Allergic rhinoconjunctivitis 08/02/2015   Allergies    Arthritis    hands   Asthma    Dr Laurette Schimke   Asthma, well controlled 10/31/2017   Blepharitis    Coronary artery disease 06/21/2021   GERD (gastroesophageal reflux disease)    Glaucoma    Hereditary and idiopathic peripheral neuropathy 02/16/2016   Hx of fracture    Left wrist 2018; right patella (separate incident) 2018; used braces   Left wrist injury 01/18/2017   LPRD (laryngopharyngeal reflux disease) 08/02/2015   Macular degeneration    Memory loss    in study at [place], [place] 02/2020   Migraine    Moderate persistent asthma 08/02/2015   Osteopenia    Sensorimotor neuropathy    moderate-severe with chronic denervation- Dr Cyndi Lennert, oral 10/31/2017   Vertigo    occasional positional    Past Surgical History:  Procedure Laterality Date   BUNIONECTOMY Right    Right great toe and little toe, pins in both, [place], [place]   CATARACT EXTRACTION, BILATERAL     with implants; R eye 08/2017 and L eye 09/2017   Cervical decompression and fusion   1997   COLONOSCOPY     2003, 2008, 2013   ECTOPIC PREGNANCY SURGERY     injection laryngeal plasty 2008     per notes from [facility]   KNEE SURGERY Left    meniscus @ [employer], [place], [place]   nissan infundibulm     SALPINGECTOMY Right    [place], [place]   THROAT SURGERY     vocal cords injected with liquid bone  2005   [place], [place]    Family History  Problem Relation Age of Onset   Alzheimer's disease Mother        Deceased, 59   Hypercholesterolemia Mother    Heart attack Father    Congestive Heart Failure Father        Deceased, 50   Emphysema Father        smoker   CAD Father    Benign prostatic hyperplasia Father    Skin cancer Sister    Allergic rhinitis Sister    Hypertension Sister    Hypercholesterolemia Sister    Obesity Sister    Irritable bowel syndrome Sister    Aneurysm Brother    Skin cancer Brother        basal cell   Memory loss Brother    COPD Brother    Congestive Heart Failure Brother  Emphysema Brother    Stroke Maternal Grandmother    Dementia Maternal Grandmother    Prostate cancer Maternal Grandfather    Dementia Maternal Grandfather    Diabetes Son    Hypertension Son    Diabetes Daughter    Asthma Daughter    Stroke Paternal Grandmother    CAD Paternal Grandfather    Social History:  reports that she has never smoked. She has never used smokeless tobacco. She reports that she does not currently use alcohol. She reports that she does not use drugs.  Allergies:  Allergies  Allergen Reactions   Valium [Diazepam]     Hypersensitivity - over sedated     Medications Prior to Admission  Medication Sig Dispense Refill   acetaminophen (TYLENOL) 500 MG tablet Take 1,000 mg by mouth in the morning and at bedtime.     albuterol (PROVENTIL HFA) 108 (90 Base) MCG/ACT inhaler Inhale 2 puffs into the lungs every 6 (six) hours as needed for wheezing or shortness of breath. 54 g 0   aspirin EC 81 MG tablet Take 81 mg  by mouth daily. Swallow whole.     azelastine (ASTELIN) 0.1 % nasal spray Place 2 sprays into both nostrils 2 (two) times daily. 30 mL 5   bimatoprost (LUMIGAN) 0.01 % SOLN Place 1 drop into both eyes at bedtime.     BREZTRI AEROSPHERE 160-9-4.8 MCG/ACT AERO INHALE TWO PUFFS BY MOUTH INTO LUNGS in THE morning AND AT bedtime 10.7 g 5   Calcium Carbonate-Vitamin D 600-200 MG-UNIT TABS Take 1 tablet by mouth daily.     Cholecalciferol (VITAMIN D) 50 MCG (2000 UT) tablet Take 2,000 Units by mouth daily.     donepezil (ARICEPT) 10 MG tablet TAKE ONE TABLET BY MOUTH EVERYDAY AT BEDTIME 90 tablet 0   famotidine (PEPCID) 20 MG tablet Take 1 tablet (20 mg total) by mouth at bedtime. 30 tablet 5   fluconazole (DIFLUCAN) 100 MG tablet TAKE ONE TABLET BY MOUTH EVERY 14 DAYS 30 tablet 5   fluticasone (FLONASE) 50 MCG/ACT nasal spray USE ONE SPRAY IN EACH NOSTRIL ONCE DAILY (Patient taking differently: Place 1 spray into both nostrils at bedtime. USE ONE SPRAY IN EACH NOSTRIL ONCE DAILY) 48 g 1   Multiple Vitamins-Minerals (PRESERVISION AREDS 2+MULTI VIT PO) Take 1 capsule by mouth in the morning and at bedtime.     Nutritional Supplements (JUICE PLUS FIBRE PO) Take 2 capsules by mouth daily. Berry Blend     Nutritional Supplements (JUICE PLUS FIBRE PO) Take 2 capsules by mouth daily. Vegetable Blend     nystatin (MYCOSTATIN) 100000 UNIT/ML suspension Take 5 mLs (500,000 Units total) by mouth daily as needed. 480 mL 5   omeprazole (PRILOSEC) 40 MG capsule TAKE TWO CAPSULES BY MOUTH BEFORE BREAKFAST AND TAKE TWO CAPSULES BEFORE evening meal 120 capsule 5   Polyethyl Glycol-Propyl Glycol (SYSTANE OP) Place 1 drop into both eyes as needed (dry eyes).     rosuvastatin (CRESTOR) 5 MG tablet TAKE ONE TABLET BY MOUTH ONCE DAILY (biocon brand) 90 tablet 2   SIMPLY SALINE NA Place 1 spray into the nose 2 (two) times daily.      Results for orders placed or performed during the hospital encounter of 03/13/22 (from the  past 48 hour(s))  CBC     Status: Abnormal   Collection Time: 03/13/22 11:31 AM  Result Value Ref Range   WBC 5.9 4.0 - 10.5 K/uL   RBC 4.25 3.87 - 5.11 MIL/uL  Hemoglobin 13.8 12.0 - 15.0 g/dL   HCT 44.0 36.0 - 46.0 %   MCV 103.5 (H) 80.0 - 100.0 fL   MCH 32.5 26.0 - 34.0 pg   MCHC 31.4 30.0 - 36.0 g/dL   RDW 13.2 11.5 - 15.5 %   Platelets 156 150 - 400 K/uL   nRBC 0.0 0.0 - 0.2 %    Comment: Performed at [facility] [address] [place], [place] [id number]  Basic metabolic panel     Status: Abnormal   Collection Time: 03/13/22 11:31 AM  Result Value Ref Range   Sodium 140 135 - 145 mmol/L   Potassium 4.3 3.5 - 5.1 mmol/L   Chloride 108 98 - 111 mmol/L   CO2 22 22 - 32 mmol/L   Glucose, Bld 81 70 - 99 mg/dL    Comment: Glucose reference range applies only to samples taken after fasting for at least 8 hours.   BUN 24 (H) 8 - 23 mg/dL   Creatinine, Ser 0.89 0.44 - 1.00 mg/dL   Calcium 9.4 8.9 - 10.3 mg/dL   GFR, Estimated >60 >60 mL/min    Comment: (NOTE) Calculated using the CKD-EPI Creatinine Equation (2021)    Anion gap 10 5 - 15    Comment: Performed at [facility] [address] [place], [place] [address]   No results found.  Review of Systems  Musculoskeletal:  Positive for arthralgias.  All other systems reviewed and are negative.   Blood pressure (!) 132/49, pulse (!) 44, temperature 98.9 F (37.2 C), temperature source Oral, resp. rate 18, height 5\' 8"  (1.727 m), weight 50.8 kg, SpO2 100 %. Physical Exam Vitals reviewed.  HENT:     Head: Normocephalic.     Nose: Nose normal.     Mouth/Throat:     Mouth: Mucous membranes are moist.  Eyes:     Pupils: Pupils are equal, round, and reactive to light.  Cardiovascular:     Rate and Rhythm: Normal rate.     Pulses: Normal pulses.  Pulmonary:     Effort: Pulmonary effort is normal.  Abdominal:     General: Abdomen is flat.  Musculoskeletal:     Cervical back: Normal range of motion.   Skin:    General: Skin is warm.     Capillary Refill: Capillary refill takes less than 2 seconds.  Neurological:     General: No focal deficit present.     Mental Status: She is alert.  Psychiatric:        Mood and Affect: Mood normal.     Ortho exam demonstrates left shoulder with well-preserved active and passive range of motion.  Increased pain with terminal abduction and forward flexion.  External rotation to 60 degrees, abduction to 90 degrees, forward flexion to 150 degrees.  5/5 EPL, grip strength, finger abduction, pronation/supination, bicep, tricep, deltoid.  Excellent strength of  infra, subscap with reproduction of pain with supra/infra strength testing.  Mild weakness and crepitus with supraspinatus testing as well as internal and external rotation at 90 degrees of abduction.  O'Brien's and speeds testing positive on the left equivocal on the right axillary nerve intact with deltoid firing.  Assessment/Plan Impression is left shoulder rotator cuff tear with injury in March.  We have discussed operative and nonoperative treatment options.  Patient would like to undergo repair based on fairly profound weakness as well as pain which interferes with her activities of daily living.  The risk and benefits  of surgery are discussed including not limited to infection nerve vessel damage as well as loss of fixation due to poor bone quality.  It can be very difficult to achieve stable fixation with either corkscrews or swivel locks based on bone quality.  Nonetheless the patient would like to proceed based on the disability she is having in the shoulder.  All questions answered  Anderson Malta, MD 03/13/2022, 12:51 PM

## 2022-03-13 NOTE — Brief Op Note (Signed)
   03/13/2022  3:46 PM  PATIENT:  Victoria Trevino  81 y.o. female  PRE-OPERATIVE DIAGNOSIS:  left shoulder rotator cuff tear with biceps tendinosis  POST-OPERATIVE DIAGNOSIS:  left shoulder rotator cuff tear with biceps tendinosis  PROCEDURE:  Procedure(s): LEFT SHOULDER ARTHROSCOPY, WITH MINI OPEN ROTATOR CUFF TEAR REPAIR & BICEPS TENODESIS  SURGEON:  Surgeon(s): Cammy Copa, MD  ASSISTANT: magnant pa  ANESTHESIA:   general  EBL: 10 ml    Total I/O In: 1450 [I.V.:1000; IV Piggyback:450] Out: 25 [Blood:25]  BLOOD ADMINISTERED: none  DRAINS: none   LOCAL MEDICATIONS USED:  vanco  SPECIMEN:  No Specimen  COUNTS:  YES  TOURNIQUET:  * No tourniquets in log *  DICTATION: .Other Dictation: Dictation Number [id number]  PLAN OF CARE: Discharge to home after PACU  PATIENT DISPOSITION:  PACU - hemodynamically stable  DIAGNOSES: Left shoulder, chronic rotator cuff tear.biceps tendonitis  POST-OPERATIVE DIAGNOSIS: same  PROCEDURE: Open repair chronic rotator cuff tear - 65790 Biceps tenodesis 23430 Limited debridement 29822   OPERATIVE FINDING: Exam under anesthesia: Normal Articular space: Normal Chondral surfaces:  cm grade 1 8 x 8 mm central glenoid Biceps:  fraying and tendonitis Subscapularis: Intact  Supraspinatus: Complete tear 1.5 x 2 cm Infraspinatus: Intact

## 2022-03-13 NOTE — Anesthesia Procedure Notes (Signed)
Anesthesia Regional Block: Interscalene brachial plexus block   Pre-Anesthetic Checklist: , timeout performed,  Correct Patient, Correct Site, Correct Laterality,  Correct Procedure, Correct Position, site marked,  Risks and benefits discussed,  Surgical consent,  Pre-op evaluation,  At surgeon's request and post-op pain management  Laterality: Left  Prep: chloraprep       Needles:  Injection technique: Single-shot  Needle Type: Echogenic Stimulator Needle     Needle Length: 9cm      Additional Needles:   Procedures:,,,, ultrasound used (permanent image in chart),,     Nerve Stimulator or Paresthesia:  Response: 0.5 mA  Additional Responses:   Narrative:  Start time: 03/13/2022 12:47 PM End time: 03/13/2022 12:56 PM Injection made incrementally with aspirations every 5 mL.  Performed by: Personally  Anesthesiologist: Eilene Ghazi, MD  Additional Notes: Patient tolerated the procedure well without complications

## 2022-03-13 NOTE — Anesthesia Procedure Notes (Addendum)
Procedure Name: Intubation Date/Time: 03/13/2022 1:34 PM  Performed by: Shary Decamp, CRNAPre-anesthesia Checklist: Patient identified, Patient being monitored, Timeout performed, Emergency Drugs available and Suction available Patient Re-evaluated:Patient Re-evaluated prior to induction Oxygen Delivery Method: Circle System Utilized Preoxygenation: Pre-oxygenation with 100% oxygen Induction Type: IV induction Ventilation: Mask ventilation without difficulty Laryngoscope Size: Miller and 2 Grade View: Grade I Tube type: Oral Tube size: 7.0 mm Number of attempts: 1 Airway Equipment and Method: Stylet Placement Confirmation: ETT inserted through vocal cords under direct vision, positive ETCO2 and breath sounds checked- equal and bilateral Secured at: 21 cm Tube secured with: Tape Dental Injury: Teeth and Oropharynx as per pre-operative assessment

## 2022-03-13 NOTE — Op Note (Signed)
NAMEMARIPAT, Victoria Trevino MEDICAL RECORD NO: [id number] ACCOUNT NO: [id number] DATE OF BIRTH: 05-05-1941 FACILITY: [facility] LOCATION: [facility] PHYSICIAN: Graylin Shiver. August Saucer, MD  Operative Report   DATE OF PROCEDURE: 03/13/2022  PREOPERATIVE DIAGNOSIS:  Left shoulder biceps tendinitis and rotator cuff tear.  POSTOPERATIVE DIAGNOSIS:  Left shoulder biceps tendinitis and rotator cuff tear.  PROCEDURE:  Left shoulder arthroscopy with limited debridement of the superior labrum with mini open rotator cuff tear repair and biceps tenodesis.  SURGEON:  Graylin Shiver. August Saucer, MD  ASSISTANT:  Karenann Cai, PA.  INDICATIONS:  The patient is an 81 year old with rotator cuff tear following trauma in March.  Failed conservative management, presents now for operative management after explanation of risks and benefits.  DESCRIPTION OF PROCEDURE:  The patient was brought to the operating room where general anesthetic was induced.  Preoperative antibiotics administered.  Timeout was called.  The patient was placed in the beach chair position with the head in neutral  position.  Left arm examined under anesthesia, found to have excellent range of motion passively of 75/110/180.  Shoulder was stable anteriorly and posteriorly.  Left shoulder was then prescrubbed with alcohol and Betadine, allowed to air dry, prepped  with DuraPrep solution and draped in sterile manner.  Ioban used to seal the operative field and cover the axilla.  Posterior portal was created 2 cm medial and inferior to the posterolateral margin of acromion.  Diagnostic arthroscopy was performed.   Anterior portal created under direct visualization.  The patient had generally intact glenohumeral joint.  A little bit of mild arthritis in the center of the glenoid around an 8 mm diameter area, but otherwise intact.  There was rotator cuff tear of the  supraspinatus about 1.5 x 1.5 cm, which was bigger once the devitalized appearing edges were debrided.  The biceps  tendon had significant tendinitis.  Subscap intra-articular was intact.  Anterior inferior, posterior inferior glenohumeral ligaments  intact.  The biceps tendon was released.  Superior labrum was debrided with the Arthrocare wand.  Instruments were removed.  Portals were closed using 3-0 nylon.  Ioban used to cover the operative field.  The incision made off the anterolateral margin of  the acromion. Skin and subcutaneous tissue sharply divided.  The raphae between the anterior middle deltoid was developed marked with #1 Vicryl suture, 4 cm from the anterolateral margin of the acromion.  Bursectomy performed acromioplasty performed  with a rasp.  Biceps tendon was then tenodesed into the bicipital groove using Arthrex knotless suture anchor distally and 1 proximally, which was also a knotless suture anchor with 2 limbs.  This gave very good suture tension on the biceps tendon, which  had been wrapped with a FiberLoop suture.  Good fixation achieved and oversewn x2 with 0 Vicryl suture.  Attention was then directed towards the rotator cuff tear.  Leading edge of supraspinatus was torn.  This was the size approximately 2 cm x 1.5 cm,  after debriding the devitalized appearing tendon edges margin convergence was performed with three 2-0 FiberWire sutures and they were incorporated into the SwiveLocks laterally.  Next, 2 knotless suture tapes were placed at the junction of greater  tuberosity and articular surface. These 8 suture tapes were then passed with the Scorpion suture passer posterior to anterior.  Next, the convergence sutures were tied.  Three 0 Vicryl sutures were then placed in the leading edge of the tendon in order  to facilitate watertight repair.  The suture tapes tied and the suture  tapes were crossed.  Next, the Vicryl sutures were split along with the FiberWire sutures and all the sutures were placed into the SwiveLocks anteriorly and posteriorly with  watertight repair achieved.  Arm was  taken through range of motion, found to have no crepitus.  Very stable repair.  Bone quality was good enough for the SwiveLocks, thorough irrigation was performed.  Vancomycin powder placed.  The deltoid split was  closed using #1 Vicryl suture followed by interrupted inverted 0 Vicryl suture, 2-0 Vicryl suture, and 3-0 Monocryl.  Steri-Strips and impervious dressings placed along with the shoulder sling.  The patient tolerated the procedure well without immediate  complications, transferred to recovery room in stable condition.  Luke's assistance was required at all times for retraction, opening, closing, mobilization of tissue, anchor placement, suture management.  His assistance was a medical necessity.   PUS D: 03/13/2022 3:51:41 pm T: 03/13/2022 6:53:00 pm  JOB: [id number]/ [id number]

## 2022-03-13 NOTE — Anesthesia Procedure Notes (Signed)
Anesthesia Procedure Image    

## 2022-03-14 ENCOUNTER — Encounter (HOSPITAL_COMMUNITY): Payer: Self-pay | Admitting: Orthopedic Surgery

## 2022-03-14 DIAGNOSIS — M7522 Bicipital tendinitis, left shoulder: Secondary | ICD-10-CM

## 2022-03-14 DIAGNOSIS — M75122 Complete rotator cuff tear or rupture of left shoulder, not specified as traumatic: Secondary | ICD-10-CM

## 2022-03-14 DIAGNOSIS — M65912 Unspecified synovitis and tenosynovitis, left shoulder: Secondary | ICD-10-CM

## 2022-03-14 DIAGNOSIS — M65812 Other synovitis and tenosynovitis, left shoulder: Secondary | ICD-10-CM

## 2022-03-14 NOTE — Anesthesia Postprocedure Evaluation (Signed)
Anesthesia Post Note  Patient: Victoria Trevino  Procedure(s) Performed: LEFT SHOULDER ARTHROSCOPY, WITH MINI OPEN ROTATOR CUFF TEAR REPAIR & BICEPS TENODESIS (Left)     Patient location during evaluation: PACU Anesthesia Type: General and Regional Level of consciousness: awake and alert Pain management: pain level controlled Vital Signs Assessment: post-procedure vital signs reviewed and stable Respiratory status: spontaneous breathing, nonlabored ventilation, respiratory function stable and patient connected to nasal cannula oxygen Cardiovascular status: blood pressure returned to baseline and stable Postop Assessment: no apparent nausea or vomiting Anesthetic complications: no   No notable events documented.  Last Vitals:  Vitals:   03/13/22 1634 03/13/22 1649  BP: (!) 109/53 (!) 117/54  Pulse:    Resp:    Temp:    SpO2:      Last Pain:  Vitals:   03/13/22 1604  TempSrc:   PainSc: 0-No pain                 Victoria Trevino

## 2022-03-21 ENCOUNTER — Ambulatory Visit (INDEPENDENT_AMBULATORY_CARE_PROVIDER_SITE_OTHER): Payer: PPO | Admitting: Surgical

## 2022-03-21 ENCOUNTER — Encounter: Payer: Self-pay | Admitting: Orthopedic Surgery

## 2022-03-21 DIAGNOSIS — M75102 Unspecified rotator cuff tear or rupture of left shoulder, not specified as traumatic: Secondary | ICD-10-CM

## 2022-03-21 NOTE — Progress Notes (Signed)
Post-Op Visit Note   Patient: Victoria Trevino           Date of Birth: [birth year]           MRN: [id number] Visit Date: 03/21/2022 PCP: Merlene Laughter, MD   Assessment & Plan:  Chief Complaint:  Chief Complaint  Patient presents with   Left Shoulder - Routine Post Op   Visit Diagnoses:  1. Tear of left supraspinatus tendon     Plan: Victoria Trevino is a 81 y.o. female who presents s/p left shoulder rotator cuff repair and biceps tenodesis on 03/13/2022.  Patient is doing well and pain is overall controlled.  Up to 82 degrees on CPM machine.  Denies any chest pain, SOB, fevers, chills.  Has not taken pain medication for several days.  No fevers, chills, drainage from the incisions.  In a recliner.  Bowel movements are improving.  On exam, patient has range of motion 10 degrees external rotation, 80 degrees of abduction, 90 degrees forward flexion.  Intact EPL, FPL, finger abduction, finger adduction, pronation/supination, bicep, tricep, deltoid of operative extremity; she does have a lot of stiffness and weakness of her left hand and wrist and elbow which is likely due to her not really using this since surgery.Marland Kitchen  Axillary nerve intact with deltoid firing.  Incisions are healing well without evidence of infection or dehiscence.  Sutures removed and replaced with Steri-Strips today.  2+ radial pulse of the operative extremity  Plan is continue with CPM machine.  Follow-up in 2 weeks for clinical recheck.  Start physical therapy at that time.  Cautioned her against any lifting or active range of motion of the shoulder.  Did strongly encourage her to work on active range of motion of her fingers, hand, wrist, elbow as she is already becoming stiff.  She will use the CPM machine until 3 weeks postop and then return it.   Follow-Up Instructions: No follow-ups on file.   Orders:  No orders of the defined types were placed in this encounter.  No orders of the defined types were placed in this  encounter.   Imaging: No results found.  PMFS History: Patient Active Problem List   Diagnosis Date Noted   Complete tear of left rotator cuff    Biceps tendonitis on left    Synovitis of left shoulder    Age-related memory disorder 11/27/2021   Acute cough 08/04/2021   Not well controlled mild persistent asthma 08/04/2021   Other allergic rhinitis 08/04/2021   MCI (mild cognitive impairment) 01/12/2021   Asthma, well controlled 10/31/2017   Thrush, oral 10/31/2017   Left wrist injury 01/18/2017   Hereditary and idiopathic peripheral neuropathy 02/16/2016   LPRD (laryngopharyngeal reflux disease) 08/02/2015   Allergic rhinoconjunctivitis 08/02/2015   Moderate persistent asthma 08/02/2015   Past Medical History:  Diagnosis Date   Allergic rhinoconjunctivitis 08/02/2015   Allergies    Arthritis    hands   Asthma    Dr Laurette Schimke   Asthma, well controlled 10/31/2017   Blepharitis    Coronary artery disease 06/21/2021   GERD (gastroesophageal reflux disease)    Glaucoma    Hereditary and idiopathic peripheral neuropathy 02/16/2016   Hx of fracture    Left wrist 2018; right patella (separate incident) 2018; used braces   Left wrist injury 01/18/2017   LPRD (laryngopharyngeal reflux disease) 08/02/2015   Macular degeneration    Memory loss    in study at [place], [facility] 02/2020  Migraine    Moderate persistent asthma 08/02/2015   Osteopenia    Sensorimotor neuropathy    moderate-severe with chronic denervation- Dr Cyndi Lennert, oral 10/31/2017   Vertigo    occasional positional    Family History  Problem Relation Age of Onset   Alzheimer's disease Mother        Deceased, 28   Hypercholesterolemia Mother    Heart attack Father    Congestive Heart Failure Father        Deceased, 59   Emphysema Father        smoker   CAD Father    Benign prostatic hyperplasia Father    Skin cancer Sister    Allergic rhinitis Sister    Hypertension Sister     Hypercholesterolemia Sister    Obesity Sister    Irritable bowel syndrome Sister    Aneurysm Brother    Skin cancer Brother        basal cell   Memory loss Brother    COPD Brother    Congestive Heart Failure Brother    Emphysema Brother    Stroke Maternal Grandmother    Dementia Maternal Grandmother    Prostate cancer Maternal Grandfather    Dementia Maternal Grandfather    Diabetes Son    Hypertension Son    Diabetes Daughter    Asthma Daughter    Stroke Paternal Grandmother    CAD Paternal Grandfather     Past Surgical History:  Procedure Laterality Date   BUNIONECTOMY Right    Right great toe and little toe, pins in both, [place], [place]   CATARACT EXTRACTION, BILATERAL     with implants; R eye 08/2017 and L eye 09/2017   Cervical decompression and fusion  1997   COLONOSCOPY     2003, 2008, 2013   ECTOPIC PREGNANCY SURGERY     injection laryngeal plasty 2008     per notes from [facility]   KNEE SURGERY Left    meniscus @ [employer], [place], [place]   nissan infundibulm     SALPINGECTOMY Right    [place], [place]   SHOULDER ARTHROSCOPY WITH OPEN ROTATOR CUFF REPAIR AND DISTAL CLAVICLE ACROMINECTOMY Left 03/13/2022   Procedure: LEFT SHOULDER ARTHROSCOPY, WITH MINI OPEN ROTATOR CUFF TEAR REPAIR & BICEPS TENODESIS;  Surgeon: Cammy Copa, MD;  Location: [facility] OR;  Service: Orthopedics;  Laterality: Left;   THROAT SURGERY     vocal cords injected with liquid bone  2005   [facility] Panning, [place]   Social History   Occupational History   Not on file  Tobacco Use   Smoking status: Never   Smokeless tobacco: Never  Vaping Use   Vaping Use: Never used  Substance and Sexual Activity   Alcohol use: Not Currently    Comment: was a glass of wine one time per year   Drug use: Never   Sexual activity: Yes    Birth control/protection: Condom

## 2022-03-26 ENCOUNTER — Telehealth: Payer: Self-pay | Admitting: Orthopedic Surgery

## 2022-03-26 NOTE — Telephone Encounter (Signed)
Patient called. She would like to know how far she should be moving her arm in the machine that she has. Her call back number is [phone]

## 2022-03-26 NOTE — Telephone Encounter (Signed)
Per Dr August Saucer ok for 1hr 3x/day up to 90 IC advised patient.

## 2022-04-09 ENCOUNTER — Ambulatory Visit (INDEPENDENT_AMBULATORY_CARE_PROVIDER_SITE_OTHER): Payer: PPO | Admitting: Orthopedic Surgery

## 2022-04-09 ENCOUNTER — Encounter: Payer: Self-pay | Admitting: Orthopedic Surgery

## 2022-04-09 DIAGNOSIS — M75102 Unspecified rotator cuff tear or rupture of left shoulder, not specified as traumatic: Secondary | ICD-10-CM

## 2022-04-09 NOTE — Progress Notes (Signed)
Post-Op Visit Note   Patient: Victoria Trevino           Date of Birth: 05/24/1941           MRN: [id number] Visit Date: 04/09/2022 PCP: Merlene Laughter, MD   Assessment & Plan:  Chief Complaint:  Chief Complaint  Patient presents with   Left Shoulder - Routine Post Op     left shoulder rotator cuff repair and biceps tenodesis on 03/13/2022   Visit Diagnoses:  1. Tear of left supraspinatus tendon     Plan: Victoria Trevino is an 81 year old patient who is now 4 weeks out left shoulder rotator cuff repair.  On examination she is little bit on the stiff side.  Rotator cuff strength is improving.  Prescription for physical therapy given.  Come back in 4 weeks.  DC sling.  No lifting out in front of her body.  Follow-Up Instructions: No follow-ups on file.   Orders:  No orders of the defined types were placed in this encounter.  No orders of the defined types were placed in this encounter.   Imaging: No results found.  PMFS History: Patient Active Problem List   Diagnosis Date Noted   Complete tear of left rotator cuff    Biceps tendonitis on left    Synovitis of left shoulder    Age-related memory disorder 11/27/2021   Acute cough 08/04/2021   Not well controlled mild persistent asthma 08/04/2021   Other allergic rhinitis 08/04/2021   MCI (mild cognitive impairment) 01/12/2021   Asthma, well controlled 10/31/2017   Thrush, oral 10/31/2017   Left wrist injury 01/18/2017   Hereditary and idiopathic peripheral neuropathy 02/16/2016   LPRD (laryngopharyngeal reflux disease) 08/02/2015   Allergic rhinoconjunctivitis 08/02/2015   Moderate persistent asthma 08/02/2015   Past Medical History:  Diagnosis Date   Allergic rhinoconjunctivitis 08/02/2015   Allergies    Arthritis    hands   Asthma    Dr Laurette Schimke   Asthma, well controlled 10/31/2017   Blepharitis    Coronary artery disease 06/21/2021   GERD (gastroesophageal reflux disease)    Glaucoma    Hereditary and idiopathic  peripheral neuropathy 02/16/2016   Hx of fracture    Left wrist 2018; right patella (separate incident) 2018; used braces   Left wrist injury 01/18/2017   LPRD (laryngopharyngeal reflux disease) 08/02/2015   Macular degeneration    Memory loss    in study at [place], [place] 02/2020   Migraine    Moderate persistent asthma 08/02/2015   Osteopenia    Sensorimotor neuropathy    moderate-severe with chronic denervation- Dr Cyndi Lennert, oral 10/31/2017   Vertigo    occasional positional    Family History  Problem Relation Age of Onset   Alzheimer's disease Mother        Deceased, 44   Hypercholesterolemia Mother    Heart attack Father    Congestive Heart Failure Father        Deceased, 4   Emphysema Father        smoker   CAD Father    Benign prostatic hyperplasia Father    Skin cancer Sister    Allergic rhinitis Sister    Hypertension Sister    Hypercholesterolemia Sister    Obesity Sister    Irritable bowel syndrome Sister    Aneurysm Brother    Skin cancer Brother        basal cell   Memory loss Brother    COPD Brother  Congestive Heart Failure Brother    Emphysema Brother    Stroke Maternal Grandmother    Dementia Maternal Grandmother    Prostate cancer Maternal Grandfather    Dementia Maternal Grandfather    Diabetes Son    Hypertension Son    Diabetes Daughter    Asthma Daughter    Stroke Paternal Grandmother    CAD Paternal Grandfather     Past Surgical History:  Procedure Laterality Date   BUNIONECTOMY Right    Right great toe and little toe, pins in both, [place], [place]   CATARACT EXTRACTION, BILATERAL     with implants; R eye 08/2017 and L eye 09/2017   Cervical decompression and fusion  1997   COLONOSCOPY     2003, 2008, 2013   ECTOPIC PREGNANCY SURGERY     injection laryngeal plasty 2008     per notes from [facility]   KNEE SURGERY Left    meniscus @ [employer], [place], [place]   nissan infundibulm     SALPINGECTOMY  Right    [place], [place]   SHOULDER ARTHROSCOPY WITH OPEN ROTATOR CUFF REPAIR AND DISTAL CLAVICLE ACROMINECTOMY Left 03/13/2022   Procedure: LEFT SHOULDER ARTHROSCOPY, WITH MINI OPEN ROTATOR CUFF TEAR REPAIR & BICEPS TENODESIS;  Surgeon: Cammy Copa, MD;  Location: [facility] OR;  Service: Orthopedics;  Laterality: Left;   THROAT SURGERY     vocal cords injected with liquid bone  2005   [facility]cy Panning, [place]   Social History   Occupational History   Not on file  Tobacco Use   Smoking status: Never   Smokeless tobacco: Never  Vaping Use   Vaping Use: Never used  Substance and Sexual Activity   Alcohol use: Not Currently    Comment: was a glass of wine one time per year   Drug use: Never   Sexual activity: Yes    Birth control/protection: Condom

## 2022-04-12 DIAGNOSIS — M25512 Pain in left shoulder: Secondary | ICD-10-CM | POA: Diagnosis not present

## 2022-04-16 DIAGNOSIS — M25512 Pain in left shoulder: Secondary | ICD-10-CM | POA: Diagnosis not present

## 2022-04-18 DIAGNOSIS — M25512 Pain in left shoulder: Secondary | ICD-10-CM | POA: Diagnosis not present

## 2022-04-20 DIAGNOSIS — E441 Mild protein-calorie malnutrition: Secondary | ICD-10-CM | POA: Diagnosis not present

## 2022-04-20 DIAGNOSIS — M8588 Other specified disorders of bone density and structure, other site: Secondary | ICD-10-CM | POA: Diagnosis not present

## 2022-04-20 DIAGNOSIS — M25512 Pain in left shoulder: Secondary | ICD-10-CM | POA: Diagnosis not present

## 2022-04-20 DIAGNOSIS — J4530 Mild persistent asthma, uncomplicated: Secondary | ICD-10-CM | POA: Diagnosis not present

## 2022-04-20 DIAGNOSIS — Z0000 Encounter for general adult medical examination without abnormal findings: Secondary | ICD-10-CM | POA: Diagnosis not present

## 2022-04-20 DIAGNOSIS — G250 Essential tremor: Secondary | ICD-10-CM | POA: Diagnosis not present

## 2022-04-20 DIAGNOSIS — G629 Polyneuropathy, unspecified: Secondary | ICD-10-CM | POA: Diagnosis not present

## 2022-04-20 DIAGNOSIS — G3184 Mild cognitive impairment, so stated: Secondary | ICD-10-CM | POA: Diagnosis not present

## 2022-04-20 DIAGNOSIS — Z79899 Other long term (current) drug therapy: Secondary | ICD-10-CM | POA: Diagnosis not present

## 2022-04-20 DIAGNOSIS — K9089 Other intestinal malabsorption: Secondary | ICD-10-CM | POA: Diagnosis not present

## 2022-04-20 DIAGNOSIS — D692 Other nonthrombocytopenic purpura: Secondary | ICD-10-CM | POA: Diagnosis not present

## 2022-04-23 ENCOUNTER — Other Ambulatory Visit: Payer: Self-pay | Admitting: Internal Medicine

## 2022-04-23 ENCOUNTER — Other Ambulatory Visit: Payer: Self-pay | Admitting: Geriatric Medicine

## 2022-04-23 DIAGNOSIS — M25512 Pain in left shoulder: Secondary | ICD-10-CM | POA: Diagnosis not present

## 2022-04-23 DIAGNOSIS — Z1231 Encounter for screening mammogram for malignant neoplasm of breast: Secondary | ICD-10-CM

## 2022-04-25 DIAGNOSIS — M25512 Pain in left shoulder: Secondary | ICD-10-CM | POA: Diagnosis not present

## 2022-04-27 DIAGNOSIS — M25512 Pain in left shoulder: Secondary | ICD-10-CM | POA: Diagnosis not present

## 2022-04-30 DIAGNOSIS — M25512 Pain in left shoulder: Secondary | ICD-10-CM | POA: Diagnosis not present

## 2022-05-02 ENCOUNTER — Encounter: Payer: PPO | Attending: Psychology | Admitting: Psychology

## 2022-05-02 ENCOUNTER — Encounter: Payer: Self-pay | Admitting: Psychology

## 2022-05-02 DIAGNOSIS — G3184 Mild cognitive impairment, so stated: Secondary | ICD-10-CM | POA: Diagnosis not present

## 2022-05-02 DIAGNOSIS — M25512 Pain in left shoulder: Secondary | ICD-10-CM | POA: Diagnosis not present

## 2022-05-02 NOTE — Progress Notes (Signed)
05/02/2022 10 AM-11 AM:  Today's visit was an in person visit that was conducted in my outpatient clinic office.  The patient myself were present.  The patient had a friend drive her to this appointment but did not come in for the appointment itself.  This was due to the patient recently having left shoulder surgery and having driving restrictions.  The patient has continued to drive and has done so without incident and plans to returning to driving once her shoulder heals and she finishes physical therapy.  The previous neuropsychological evaluation that can be found in the patient's EMR dated 06/26/2021 indicated that there were no consistent findings suggestive of a progressive cortical dementia such as Alzheimer's etc.  The patient did have issues and changes related to some changes in memory functions that were mild in nature.  Most of these memory weaknesses were due to retrieval of new information rather than an inability to effectively encode, store and organize new information.  These findings were true for both auditory and visual memory.  During today's clinical interview the patient reports that she is feeling pretty good and feels like she is not "failing herself."  The patient reports that she is effectively getting through her days, weeks and months without major problems.  The patient reports that she continues to have some difficulty learning new people's names or recalling names of others or recalling information in the moment.  She reports that as before they still tend to come back later when this is an issue.  The patient reports that there has been no change in her memory capacity and no worsening of symptoms.  The patient reports that she is having no changes in geographic orientation, visual-spatial functioning, executive functioning including reasoning and problem-solving.  The patient reports that she has continued to drive with a current break due to recent left shoulder surgery.  The  patient reports that she is going to rehab well and benefiting from these efforts.  The patient denies any significant depression or anxiety type symptoms and she is adapting to returning back to normal life post COVID.  The patient has made some significant psychosocial changes over the past couple of years including changing churches which initially was difficult due to COVID restrictions but these are improving and she is developing various coping resources for both developing new friends and connections as well as managing and coping with the types of memory difficulties that she has.  With the information provided today I do not think that it is particularly needed to do repeat testing.  We do have the baseline measures that were derived in 2022 that remain in her EMR if needed.  We will not do repeat testing at this point but I will remain available if there are any specific changes and we talked about issues where repeat testing would be warranted.  The patient has been working on maintaining some foundational issues and we we discussed the need for maintaining good dietary habits, maintaining good sleep patterns and good sleep hygiene and the need for sustained physical activity on a daily basis.  The patient reports that she is either doing these are working on trying to improve these various aspects.  Her recent physical activity has been limited due to shoulder surgery.

## 2022-05-04 DIAGNOSIS — M25512 Pain in left shoulder: Secondary | ICD-10-CM | POA: Diagnosis not present

## 2022-05-08 DIAGNOSIS — M25512 Pain in left shoulder: Secondary | ICD-10-CM | POA: Diagnosis not present

## 2022-05-09 ENCOUNTER — Encounter: Payer: Self-pay | Admitting: Orthopedic Surgery

## 2022-05-09 ENCOUNTER — Ambulatory Visit (INDEPENDENT_AMBULATORY_CARE_PROVIDER_SITE_OTHER): Payer: PPO | Admitting: Orthopedic Surgery

## 2022-05-09 DIAGNOSIS — M75102 Unspecified rotator cuff tear or rupture of left shoulder, not specified as traumatic: Secondary | ICD-10-CM

## 2022-05-09 NOTE — Progress Notes (Signed)
Post-Op Visit Note   Patient: Victoria Trevino           Date of Birth: 1941/07/28           MRN: [id number] Visit Date: 05/09/2022 PCP: Merlene Laughter, MD   Assessment & Plan:  Chief Complaint:  Chief Complaint  Patient presents with   Left Shoulder - Follow-up    left shoulder rotator cuff repair and biceps tenodesis on 03/13/2022   Visit Diagnoses:  1. Tear of left supraspinatus tendon     Plan: Victoria Trevino is an 81 year old patient who is now about 7 weeks out left shoulder rotator cuff repair and biceps tenodesis.  Making progress.  On examination she has passive range of motion 30/90/120.  Therapy notes are reviewed.  Overall she is making good progress with range of motion.  Still slightly on the stiff side but heading in the right direction.  Would like her to focus most of her physical therapy efforts over the next 6 weeks on range of motion and then in 6 weeks we will give her clinical recheck and evaluate for strengthening.  Follow-Up Instructions: Return in about 6 weeks (around 06/20/2022).   Orders:  No orders of the defined types were placed in this encounter.  No orders of the defined types were placed in this encounter.   Imaging: No results found.  PMFS History: Patient Active Problem List   Diagnosis Date Noted   Complete tear of left rotator cuff    Biceps tendonitis on left    Synovitis of left shoulder    Age-related memory disorder 11/27/2021   Acute cough 08/04/2021   Not well controlled mild persistent asthma 08/04/2021   Other allergic rhinitis 08/04/2021   MCI (mild cognitive impairment) 01/12/2021   Asthma, well controlled 10/31/2017   Thrush, oral 10/31/2017   Left wrist injury 01/18/2017   Hereditary and idiopathic peripheral neuropathy 02/16/2016   LPRD (laryngopharyngeal reflux disease) 08/02/2015   Allergic rhinoconjunctivitis 08/02/2015   Moderate persistent asthma 08/02/2015   Past Medical History:  Diagnosis Date   Allergic  rhinoconjunctivitis 08/02/2015   Allergies    Arthritis    hands   Asthma    Dr Laurette Schimke   Asthma, well controlled 10/31/2017   Blepharitis    Coronary artery disease 06/21/2021   GERD (gastroesophageal reflux disease)    Glaucoma    Hereditary and idiopathic peripheral neuropathy 02/16/2016   Hx of fracture    Left wrist 2018; right patella (separate incident) 2018; used braces   Left wrist injury 01/18/2017   LPRD (laryngopharyngeal reflux disease) 08/02/2015   Macular degeneration    Memory loss    in study at [place], [place] 02/2020   Migraine    Moderate persistent asthma 08/02/2015   Osteopenia    Sensorimotor neuropathy    moderate-severe with chronic denervation- Dr Cyndi Lennert, oral 10/31/2017   Vertigo    occasional positional    Family History  Problem Relation Age of Onset   Alzheimer's disease Mother        Deceased, 72   Hypercholesterolemia Mother    Heart attack Father    Congestive Heart Failure Father        Deceased, 27   Emphysema Father        smoker   CAD Father    Benign prostatic hyperplasia Father    Skin cancer Sister    Allergic rhinitis Sister    Hypertension Sister    Hypercholesterolemia Sister  Obesity Sister    Irritable bowel syndrome Sister    Aneurysm Brother    Skin cancer Brother        basal cell   Memory loss Brother    COPD Brother    Congestive Heart Failure Brother    Emphysema Brother    Stroke Maternal Grandmother    Dementia Maternal Grandmother    Prostate cancer Maternal Grandfather    Dementia Maternal Grandfather    Diabetes Son    Hypertension Son    Diabetes Daughter    Asthma Daughter    Stroke Paternal Grandmother    CAD Paternal Grandfather     Past Surgical History:  Procedure Laterality Date   BUNIONECTOMY Right    Right great toe and little toe, pins in both, [place], [place]   CATARACT EXTRACTION, BILATERAL     with implants; R eye 08/2017 and L eye 09/2017   Cervical  decompression and fusion  1997   COLONOSCOPY     2003, 2008, 2013   ECTOPIC PREGNANCY SURGERY     injection laryngeal plasty 2008     per notes from [facility]   KNEE SURGERY Left    meniscus @ [employer], [place], [place]   nissan infundibulm     SALPINGECTOMY Right    [place], [place]   SHOULDER ARTHROSCOPY WITH OPEN ROTATOR CUFF REPAIR AND DISTAL CLAVICLE ACROMINECTOMY Left 03/13/2022   Procedure: LEFT SHOULDER ARTHROSCOPY, WITH MINI OPEN ROTATOR CUFF TEAR REPAIR & BICEPS TENODESIS;  Surgeon: Cammy Copa, MD;  Location: [facility] OR;  Service: Orthopedics;  Laterality: Left;   THROAT SURGERY     vocal cords injected with liquid bone  2005   [facility], [place]   Social History   Occupational History   Not on file  Tobacco Use   Smoking status: Never   Smokeless tobacco: Never  Vaping Use   Vaping Use: Never used  Substance and Sexual Activity   Alcohol use: Not Currently    Comment: was a glass of wine one time per year   Drug use: Never   Sexual activity: Yes    Birth control/protection: Condom

## 2022-05-10 DIAGNOSIS — M25512 Pain in left shoulder: Secondary | ICD-10-CM | POA: Diagnosis not present

## 2022-05-14 DIAGNOSIS — M25512 Pain in left shoulder: Secondary | ICD-10-CM | POA: Diagnosis not present

## 2022-05-17 DIAGNOSIS — M25512 Pain in left shoulder: Secondary | ICD-10-CM | POA: Diagnosis not present

## 2022-05-21 DIAGNOSIS — M25512 Pain in left shoulder: Secondary | ICD-10-CM | POA: Diagnosis not present

## 2022-05-23 DIAGNOSIS — M25512 Pain in left shoulder: Secondary | ICD-10-CM | POA: Diagnosis not present

## 2022-05-28 DIAGNOSIS — M25512 Pain in left shoulder: Secondary | ICD-10-CM | POA: Diagnosis not present

## 2022-05-30 DIAGNOSIS — M25512 Pain in left shoulder: Secondary | ICD-10-CM | POA: Diagnosis not present

## 2022-06-05 DIAGNOSIS — M25512 Pain in left shoulder: Secondary | ICD-10-CM | POA: Diagnosis not present

## 2022-06-06 ENCOUNTER — Ambulatory Visit
Admission: RE | Admit: 2022-06-06 | Discharge: 2022-06-06 | Disposition: A | Discharge status: Home / Self Care | Payer: PPO | Source: Ambulatory Visit | Attending: Internal Medicine | Admitting: Internal Medicine

## 2022-06-06 DIAGNOSIS — Z1231 Encounter for screening mammogram for malignant neoplasm of breast: Secondary | ICD-10-CM | POA: Diagnosis not present

## 2022-06-07 DIAGNOSIS — M25512 Pain in left shoulder: Secondary | ICD-10-CM | POA: Diagnosis not present

## 2022-06-11 ENCOUNTER — Other Ambulatory Visit: Payer: Self-pay | Admitting: Neurology

## 2022-06-11 DIAGNOSIS — M25512 Pain in left shoulder: Secondary | ICD-10-CM | POA: Diagnosis not present

## 2022-06-13 DIAGNOSIS — H0100B Unspecified blepharitis left eye, upper and lower eyelids: Secondary | ICD-10-CM | POA: Diagnosis not present

## 2022-06-13 DIAGNOSIS — H0100A Unspecified blepharitis right eye, upper and lower eyelids: Secondary | ICD-10-CM | POA: Diagnosis not present

## 2022-06-13 DIAGNOSIS — H401213 Low-tension glaucoma, right eye, severe stage: Secondary | ICD-10-CM | POA: Diagnosis not present

## 2022-06-14 DIAGNOSIS — M25512 Pain in left shoulder: Secondary | ICD-10-CM | POA: Diagnosis not present

## 2022-06-18 DIAGNOSIS — M25512 Pain in left shoulder: Secondary | ICD-10-CM | POA: Diagnosis not present

## 2022-06-20 ENCOUNTER — Ambulatory Visit: Payer: PPO | Admitting: Surgical

## 2022-06-20 ENCOUNTER — Encounter: Payer: Self-pay | Admitting: Orthopedic Surgery

## 2022-06-20 DIAGNOSIS — M75102 Unspecified rotator cuff tear or rupture of left shoulder, not specified as traumatic: Secondary | ICD-10-CM

## 2022-06-20 NOTE — Progress Notes (Signed)
Post-Op Visit Note   Patient: Victoria Trevino           Date of Birth: [birth year]           MRN: [id number] Visit Date: 06/20/2022 PCP: Lajean Manes, MD   Assessment & Plan:  Chief Complaint:  Chief Complaint  Patient presents with   Left Shoulder - Routine Post Op    left shoulder rotator cuff repair and biceps tenodesis on 03/13/2022   Visit Diagnoses:  1. Tear of left supraspinatus tendon     Plan: Patient is a 81 year old female who presents s/p left shoulder rotator cuff tear repair and biceps tenodesis on 03/13/2022.  Doing very well overall with minimal pain.  She is going to physical therapy.  She is lifting up to 7 pound weights and using resistance bands.  She is planning on doing physical therapy for 4 more weeks to work primarily on internal rotation range of motion which is the primary thing she feels like she is lacking especially with reaching behind her.  She is sleeping well at night.  No major mechanical symptoms.  On exam she has 40 degrees X rotation, 75 degrees abduction, 150 degrees forward flexion passively and actively.  Incisions are well-healed.  No coarse grinding or crepitus noted.  She has excellent rotator cuff strength of supra, infra, subscap.  Axillary nerve is intact with deltoid firing.  Plan is to follow-up with the office for final check in 2 months but if she is feeling well at that time she may call and cancel her appointment.  Did caution her against any significant amount of lifting out away from her body or overhead.  Follow-Up Instructions: No follow-ups on file.   Orders:  No orders of the defined types were placed in this encounter.  No orders of the defined types were placed in this encounter.   Imaging: No results found.  PMFS History: Patient Active Problem List   Diagnosis Date Noted   Complete tear of left rotator cuff    Biceps tendonitis on left    Synovitis of left shoulder    Age-related memory disorder 11/27/2021   Acute  cough 08/04/2021   Not well controlled mild persistent asthma 08/04/2021   Other allergic rhinitis 08/04/2021   MCI (mild cognitive impairment) 01/12/2021   Asthma, well controlled 10/31/2017   Thrush, oral 10/31/2017   Left wrist injury 01/18/2017   Hereditary and idiopathic peripheral neuropathy 02/16/2016   LPRD (laryngopharyngeal reflux disease) 08/02/2015   Allergic rhinoconjunctivitis 08/02/2015   Moderate persistent asthma 08/02/2015   Past Medical History:  Diagnosis Date   Allergic rhinoconjunctivitis 08/02/2015   Allergies    Arthritis    hands   Asthma    Dr Allena Katz   Asthma, well controlled 10/31/2017   Blepharitis    Coronary artery disease 06/21/2021   GERD (gastroesophageal reflux disease)    Glaucoma    Hereditary and idiopathic peripheral neuropathy 02/16/2016   Hx of fracture    Left wrist 2018; right patella (separate incident) 2018; used braces   Left wrist injury 01/18/2017   LPRD (laryngopharyngeal reflux disease) 08/02/2015   Macular degeneration    Memory loss    in study at [place], [place] 02/2020   Migraine    Moderate persistent asthma 08/02/2015   Osteopenia    Sensorimotor neuropathy    moderate-severe with chronic denervation- Dr Amedeo Gory, oral 10/31/2017   Vertigo    occasional positional    Family  History  Problem Relation Age of Onset   Alzheimer's disease Mother        Deceased, 64   Hypercholesterolemia Mother    Heart attack Father    Congestive Heart Failure Father        Deceased, 20   Emphysema Father        smoker   CAD Father    Benign prostatic hyperplasia Father    Skin cancer Sister    Allergic rhinitis Sister    Hypertension Sister    Hypercholesterolemia Sister    Obesity Sister    Irritable bowel syndrome Sister    Aneurysm Brother    Skin cancer Brother        basal cell   Memory loss Brother    COPD Brother    Congestive Heart Failure Brother    Emphysema Brother    Stroke Maternal Grandmother     Dementia Maternal Grandmother    Prostate cancer Maternal Grandfather    Dementia Maternal Grandfather    Diabetes Son    Hypertension Son    Diabetes Daughter    Asthma Daughter    Stroke Paternal Grandmother    CAD Paternal Grandfather     Past Surgical History:  Procedure Laterality Date   BUNIONECTOMY Right    Right great toe and little toe, pins in both, [place], [place]   CATARACT EXTRACTION, BILATERAL     with implants; R eye 08/2017 and L eye 09/2017   Cervical decompression and fusion  1997   COLONOSCOPY     2003, 2008, 2013   ECTOPIC PREGNANCY SURGERY     injection laryngeal plasty 2008     per notes from [facility]   KNEE SURGERY Left    meniscus @ [employer], [place], [place]   nissan infundibulm     SALPINGECTOMY Right    [place], [place]   SHOULDER ARTHROSCOPY WITH OPEN ROTATOR CUFF REPAIR AND DISTAL CLAVICLE ACROMINECTOMY Left 03/13/2022   Procedure: LEFT SHOULDER ARTHROSCOPY, WITH MINI OPEN ROTATOR CUFF TEAR REPAIR & BICEPS TENODESIS;  Surgeon: Cammy Copa, MD;  Location: [facility] OR;  Service: Orthopedics;  Laterality: Left;   THROAT SURGERY     vocal cords injected with liquid bone  2005   [facility], [place]   Social History   Occupational History   Not on file  Tobacco Use   Smoking status: Never   Smokeless tobacco: Never  Vaping Use   Vaping Use: Never used  Substance and Sexual Activity   Alcohol use: Not Currently    Comment: was a glass of wine one time per year   Drug use: Never   Sexual activity: Yes    Birth control/protection: Condom

## 2022-06-26 ENCOUNTER — Other Ambulatory Visit: Payer: Self-pay | Admitting: Neurology

## 2022-07-16 DIAGNOSIS — M25512 Pain in left shoulder: Secondary | ICD-10-CM | POA: Diagnosis not present

## 2022-07-18 ENCOUNTER — Telehealth: Payer: Self-pay | Admitting: Neurology

## 2022-07-18 NOTE — Telephone Encounter (Signed)
LVM and sent mychart msg informing pt of reschedule- MD template change.

## 2022-07-18 NOTE — Telephone Encounter (Signed)
noted 

## 2022-07-18 NOTE — Telephone Encounter (Signed)
Pt called back and confirmed that she is ok with the schedule change.

## 2022-08-07 ENCOUNTER — Ambulatory Visit (INDEPENDENT_AMBULATORY_CARE_PROVIDER_SITE_OTHER): Payer: PPO | Admitting: Allergy and Immunology

## 2022-08-07 VITALS — BP 118/76 | HR 64 | Temp 98.1°F | Resp 16 | Ht 68.0 in | Wt 117.8 lb

## 2022-08-07 DIAGNOSIS — J988 Other specified respiratory disorders: Secondary | ICD-10-CM | POA: Diagnosis not present

## 2022-08-07 DIAGNOSIS — B9789 Other viral agents as the cause of diseases classified elsewhere: Secondary | ICD-10-CM

## 2022-08-07 DIAGNOSIS — B370 Candidal stomatitis: Secondary | ICD-10-CM

## 2022-08-07 DIAGNOSIS — K219 Gastro-esophageal reflux disease without esophagitis: Secondary | ICD-10-CM | POA: Diagnosis not present

## 2022-08-07 DIAGNOSIS — J4540 Moderate persistent asthma, uncomplicated: Secondary | ICD-10-CM

## 2022-08-07 DIAGNOSIS — J3089 Other allergic rhinitis: Secondary | ICD-10-CM | POA: Diagnosis not present

## 2022-08-07 MED ORDER — OMEPRAZOLE 40 MG PO CPDR: 80.0000 mg | DELAYED_RELEASE_CAPSULE | Freq: Two times a day (BID) | ORAL | 5 refills | Status: DC

## 2022-08-07 MED ORDER — FLUTICASONE PROPIONATE 50 MCG/ACT NA SUSP: NASAL | 1 refills | Status: DC

## 2022-08-07 MED ORDER — ALBUTEROL SULFATE HFA 108 (90 BASE) MCG/ACT IN AERS: 2.0000 | INHALATION_SPRAY | Freq: Four times a day (QID) | RESPIRATORY_TRACT | 0 refills | Status: DC | PRN

## 2022-08-07 MED ORDER — FAMOTIDINE 20 MG PO TABS: 20.0000 mg | ORAL_TABLET | Freq: Every evening | ORAL | 3 refills | Status: DC

## 2022-08-07 MED ORDER — BREZTRI AEROSPHERE 160-9-4.8 MCG/ACT IN AERO: INHALATION_SPRAY | RESPIRATORY_TRACT | 5 refills | Status: DC

## 2022-08-07 NOTE — Progress Notes (Signed)
[place] - [place] - [place] - [place] - [place]   Follow-up Note  Referring Provider: Merlene Laughter, MD Primary Provider: Merlene Laughter, MD Date of Office Visit: 08/07/2022  Subjective:   Victoria Trevino (DOB: 12-Aug-1941) is a 81 y.o. female who [facility] in re-evaluation of the following:  HPI: Victoria Trevino returns to this clinic in evaluation of asthma, allergic rhinitis, reflux, and a history of recurrent thrush.  I last saw her in this clinic on 13 February 2022.  Soon after her last visit she contracted a viral respiratory tract infection and then had a fair amount of respiratory tract inflammation and required a systemic steroid.  Other than that event to her respiratory tract she has really done very well up until the past 48 hours.  Over the course of the past 48 hours she developed acute onset of postnasal drip that is green and cough and a headache without any other nasal symptoms or chest symptoms or fever or aches or chills.  She is actually much better today.  Other than that recent event she has done well and rarely uses a short acting bronchodilator while she continues on a triple inhaler twice a day.  She also continues to use nasal fluticasone which has resulted in very good control of her upper airway disease.  She believes that her reflux is under very good control at this point in time while utilizing omeprazole.  She varies the dose of omeprazole depending on reflux activity and she also continues to use famotidine.  She uses nystatin in the morning and Diflucan 150 mg every other week which is working quite well for her thrush.  She has obtained a flu vaccine, RSV vaccine, and COVID-vaccine.  Allergies as of 08/07/2022       Reactions   Valium [diazepam]    Hypersensitivity - over sedated         Medication List    acetaminophen 500 MG tablet Commonly known as: TYLENOL Take 1,000 mg by mouth in the morning and  at bedtime.   albuterol 108 (90 Base) MCG/ACT inhaler Commonly known as: Proventil HFA Inhale 2 puffs into the lungs every 6 (six) hours as needed for wheezing or shortness of breath.   aspirin EC 81 MG tablet Take 81 mg by mouth daily. Swallow whole.   azelastine 0.1 % nasal spray Commonly known as: ASTELIN Place 2 sprays into both nostrils 2 (two) times daily.   Breztri Aerosphere 160-9-4.8 MCG/ACT Aero Generic drug: Budeson-Glycopyrrol-Formoterol INHALE TWO PUFFS BY MOUTH INTO LUNGS in THE morning AND AT bedtime   Calcium Carbonate-Vitamin D 600-200 MG-UNIT Tabs Take 1 tablet by mouth daily.   donepezil 10 MG tablet Commonly known as: ARICEPT TAKE ONE TABLET BY MOUTH EVERYDAY AT BEDTIME   famotidine 20 MG tablet Commonly known as: Pepcid Take 1 tablet (20 mg total) by mouth at bedtime.   fluconazole 100 MG tablet Commonly known as: DIFLUCAN TAKE ONE TABLET BY MOUTH EVERY 14 DAYS   fluticasone 50 MCG/ACT nasal spray Commonly known as: FLONASE USE ONE SPRAY IN EACH NOSTRIL ONCE DAILY   HYDROcodone-acetaminophen 5-325 MG tablet Commonly known as: NORCO/VICODIN Take 1 tablet by mouth every 4 (four) hours as needed for moderate pain.   JUICE PLUS FIBRE PO Take 2 capsules by mouth daily. Berry Blend   JUICE PLUS FIBRE PO Take 2 capsules by mouth daily. Vegetable Blend   Lumigan 0.01 % Soln Generic drug: bimatoprost Place  1 drop into both eyes at bedtime.   methocarbamol 500 MG tablet Commonly known as: ROBAXIN Take 1 tablet (500 mg total) by mouth every 8 (eight) hours as needed for muscle spasms.   nystatin 100000 UNIT/ML suspension Commonly known as: MYCOSTATIN Take 5 mLs (500,000 Units total) by mouth daily as needed.   omeprazole 40 MG capsule Commonly known as: PRILOSEC TAKE TWO CAPSULES BY MOUTH BEFORE BREAKFAST AND TAKE TWO CAPSULES BEFORE evening meal   PRESERVISION AREDS 2+MULTI VIT PO Take 1 capsule by mouth in the morning and at bedtime.    rosuvastatin 5 MG tablet Commonly known as: CRESTOR TAKE ONE TABLET BY MOUTH ONCE DAILY (biocon brand)   SIMPLY SALINE NA Place 1 spray into the nose 2 (two) times daily.   SYSTANE OP Place 1 drop into both eyes as needed (dry eyes).   Vitamin D 50 MCG (2000 UT) tablet Take 2,000 Units by mouth daily.    Past Medical History:  Diagnosis Date   Allergic rhinoconjunctivitis 08/02/2015   Allergies    Arthritis    hands   Asthma    Dr Laurette Schimke   Asthma, well controlled 10/31/2017   Blepharitis    Coronary artery disease 06/21/2021   GERD (gastroesophageal reflux disease)    Glaucoma    Hereditary and idiopathic peripheral neuropathy 02/16/2016   Hx of fracture    Left wrist 2018; right patella (separate incident) 2018; used braces   Left wrist injury 01/18/2017   LPRD (laryngopharyngeal reflux disease) 08/02/2015   Macular degeneration    Memory loss    in study at [place], [place] 02/2020   Migraine    Moderate persistent asthma 08/02/2015   Osteopenia    Sensorimotor neuropathy    moderate-severe with chronic denervation- Dr Cyndi Lennert, oral 10/31/2017   Vertigo    occasional positional    Past Surgical History:  Procedure Laterality Date   BUNIONECTOMY Right    Right great toe and little toe, pins in both, [place], [place]   CATARACT EXTRACTION, BILATERAL     with implants; R eye 08/2017 and L eye 09/2017   Cervical decompression and fusion  1997   COLONOSCOPY     2003, 2008, 2013   ECTOPIC PREGNANCY SURGERY     injection laryngeal plasty 2008     per notes from [facility]   KNEE SURGERY Left    meniscus @ [employer], [place], [place]   nissan infundibulm     SALPINGECTOMY Right    [place], [place]   SHOULDER ARTHROSCOPY WITH OPEN ROTATOR CUFF REPAIR AND DISTAL CLAVICLE ACROMINECTOMY Left 03/13/2022   Procedure: LEFT SHOULDER ARTHROSCOPY, WITH MINI OPEN ROTATOR CUFF TEAR REPAIR & BICEPS TENODESIS;  Surgeon: Cammy Copa, MD;   Location: [facility] OR;  Service: Orthopedics;  Laterality: Left;   THROAT SURGERY     vocal cords injected with liquid bone  2005   [facility], [place]    Review of systems negative except as noted in HPI / PMHx or noted below:  Review of Systems  Constitutional: Negative.   HENT: Negative.    Eyes: Negative.   Respiratory: Negative.    Cardiovascular: Negative.   Gastrointestinal: Negative.   Genitourinary: Negative.   Musculoskeletal: Negative.   Skin: Negative.   Neurological: Negative.   Endo/Heme/Allergies: Negative.   Psychiatric/Behavioral: Negative.       Objective:   Vitals:   08/07/22 1741  BP: 118/76  Pulse: 64  Resp: 16  Temp: 98.1  F (36.7 C)  SpO2: 99%   Height: 5\' 8"  (172.7 cm)  Weight: 117 lb 12.8 oz (53.4 kg)   Physical Exam Constitutional:      Appearance: She is not diaphoretic.     Comments: Slightly raspy voice  HENT:     Head: Normocephalic.     Right Ear: Tympanic membrane, ear canal and external ear normal.     Left Ear: Tympanic membrane, ear canal and external ear normal.     Nose: Nose normal. No mucosal edema or rhinorrhea.     Mouth/Throat:     Pharynx: Uvula midline. No oropharyngeal exudate.  Eyes:     Conjunctiva/sclera: Conjunctivae normal.  Neck:     Thyroid: No thyromegaly.     Trachea: Trachea normal. No tracheal tenderness or tracheal deviation.  Cardiovascular:     Rate and Rhythm: Normal rate and regular rhythm.     Heart sounds: Normal heart sounds, S1 normal and S2 normal. No murmur heard. Pulmonary:     Effort: No respiratory distress.     Breath sounds: Normal breath sounds. No stridor. No wheezing or rales.  Lymphadenopathy:     Head:     Right side of head: No tonsillar adenopathy.     Left side of head: No tonsillar adenopathy.     Cervical: No cervical adenopathy.  Skin:    Findings: No erythema or rash.     Nails: There is no clubbing.  Neurological:     Mental Status: She is alert.     Diagnostics:     Spirometry was performed and demonstrated an FEV1 of 1.84 at 80 % of predicted.  Assessment and Plan:   1. Asthma, moderate persistent, well-controlled   2. Other allergic rhinitis   3. LPRD (laryngopharyngeal reflux disease)   4. Thrush, oral   5. Viral respiratory illness     1. Continue Breztri - 2 inhalations 1-2 times per day   2. Continue omeprazole 80 mg in AM + 80 mg in PM + famotidine 20 mg in evening  3. Continue nasal fluticasone 1-2 spray each nostril 1 time per day  4. Continue nystatin oral solution 1 teaspoon swish and swallow every morning + Diflucan 150 mg tablet every other week  5. May use OTC Mucinex DM, azelastine, and ProAir HFA as needed  6. Return to clinic in 6 months or earlier if problem  7.  Check COVID swab  Song appears to be doing quite well while she continues on her anti-inflammatory medications for her airway, aggressive therapy directed against her reflux, and therapy directed against her chronic thrush.  I think that she contracted some form of viral respiratory tract infection recently but she already appears to be improving and were not going to give her any specific therapy for this issue unless of course this becomes worse.  I did make the recommendation that she consider checking a COVID swab so we have a plan available if she does get worse with this issue.  If she does well with this plan I will see her back in this clinic in 6 months or earlier if there is a problem  Laurette Schimke, MD Allergy / Immunology [facility]

## 2022-08-07 NOTE — Patient Instructions (Addendum)
   1. Continue Breztri - 2 inhalations 1-2 times per day   2. Continue omeprazole 80 mg in AM + 80 mg in PM + famotidine 20 mg in evening  3. Continue nasal fluticasone 1-2 spray each nostril 1 time per day  4. Continue nystatin oral solution 1 teaspoon swish and swallow every morning + Diflucan 150 mg tablet every other week  5. May use OTC Mucinex DM, azelastine, and ProAir HFA as needed  6. Return to clinic in 6 months or earlier if problem  7.  Check COVID swab

## 2022-08-10 ENCOUNTER — Other Ambulatory Visit: Payer: Self-pay | Admitting: Allergy and Immunology

## 2022-08-15 ENCOUNTER — Other Ambulatory Visit: Payer: Self-pay

## 2022-08-15 MED ORDER — FLUCONAZOLE 150 MG PO TABS: 150.0000 mg | ORAL_TABLET | ORAL | 1 refills | Status: DC

## 2022-08-17 ENCOUNTER — Ambulatory Visit: Payer: PPO | Admitting: Orthopedic Surgery

## 2022-08-20 ENCOUNTER — Other Ambulatory Visit: Payer: Self-pay

## 2022-08-20 ENCOUNTER — Encounter: Payer: Self-pay | Admitting: Family Medicine

## 2022-08-20 ENCOUNTER — Ambulatory Visit: Payer: PPO | Admitting: Family Medicine

## 2022-08-20 VITALS — BP 118/62 | HR 61 | Temp 97.9°F | Resp 20 | Ht 68.0 in | Wt 116.6 lb

## 2022-08-20 DIAGNOSIS — J3089 Other allergic rhinitis: Secondary | ICD-10-CM

## 2022-08-20 DIAGNOSIS — B370 Candidal stomatitis: Secondary | ICD-10-CM

## 2022-08-20 DIAGNOSIS — J4540 Moderate persistent asthma, uncomplicated: Secondary | ICD-10-CM | POA: Diagnosis not present

## 2022-08-20 DIAGNOSIS — K219 Gastro-esophageal reflux disease without esophagitis: Secondary | ICD-10-CM

## 2022-08-20 DIAGNOSIS — J4541 Moderate persistent asthma with (acute) exacerbation: Secondary | ICD-10-CM

## 2022-08-20 MED ORDER — PREDNISONE 10 MG PO TABS: ORAL_TABLET | ORAL | 0 refills | Status: DC

## 2022-08-20 MED ORDER — AMOXICILLIN-POT CLAVULANATE 875-125 MG PO TABS
1.0000 | ORAL_TABLET | Freq: Two times a day (BID) | ORAL | 0 refills | Status: DC
Start: 2022-08-20 — End: 2022-10-02

## 2022-08-20 NOTE — Patient Instructions (Addendum)
Asthma with acute exacerbation  Begin prednisone 10 mg tablets. Take 2 tablets twice a day for 3 days, then take 2 tablets once a day for 1 day, then take 1 tablet on the 5th day, then stop Continue Breztri 2 puffs twice a day with a spacer to prevent cough or wheeze Continue albuterol 2 puffs once every 4 hours as needed for cough or wheeze You may use albuterol 2 puffs 5 to 10 minutes before activity to decrease cough or wheeze  Allergic rhinitis with possible sinusitis Continue allergen avoidance measures  Continue cetirizine 10 mg once a day as needed for runny nose or itch Continue Flonase 2 sprays in each nostril once a day Consider saline nasal rinses as needed for nasal symptoms. Use this before any medicated nasal sprays for best result If no relief after aggressive nasal spray and saline rinses, then begin Augmentin 875 twice a day for 7 days, then stop  Reflux Continue dietary and lifestyle modifications as listed below Continue omeprazole 80 mg twice a day and famotidine 20 mg once a day  Thrush Continue nystatin 5 mg once every morning Continue Diflucan 150 mg once every other week  Call the clinic if this treatment plan is not working well for you.  Follow up in 1 month or sooner if needed   Lifestyle Changes for Controlling GERD When you have GERD, stomach acid feels as if it's backing up toward your mouth. Whether or not you take medication to control your GERD, your symptoms can often be improved with lifestyle changes.   Raise Your Head Reflux is more likely to strike when you're lying down flat, because stomach fluid can flow backward more easily. Raising the head of your bed 4-6 inches can help. To do this: Slide blocks or books under the legs at the head of your bed. Or, place a wedge under the mattress. Many foam stores can make a suitable wedge for you. The wedge should run from your waist to the top of your head. Don't just prop your head on several  pillows. This increases pressure on your stomach. It can make GERD worse.  Watch Your Eating Habits Certain foods may increase the acid in your stomach or relax the lower esophageal sphincter, making GERD more likely. It's best to avoid the following: Coffee, tea, and carbonated drinks (with and without caffeine) Fatty, fried, or spicy food Mint, chocolate, onions, and tomatoes Any other foods that seem to irritate your stomach or cause you pain  Relieve the Pressure Eat smaller meals, even if you have to eat more often. Don't lie down right after you eat. Wait a few hours for your stomach to empty. Avoid tight belts and tight-fitting clothes. Lose excess weight.  Tobacco and Alcohol Avoid smoking tobacco and drinking alcohol. They can make GERD symptoms worse.

## 2022-08-20 NOTE — Progress Notes (Signed)
[address] [place] [address] Dept: [phone]  FOLLOW UP NOTE  Patient ID: Victoria Trevino, female    DOB: 1941-03-16  Age: 81 y.o. MRN: [id number] Date of Office Visit: 08/20/2022  Assessment  Chief Complaint: Cough, Nasal Congestion, and Headache (Hast lost voice coughing headaches and congestion.)  HPI Victoria Trevino is an 81 year old female who presents to the clinic for follow-up visit.  She was last seen in this clinic on 08/07/2022 by Dr. Lucie Leather for evaluation of asthma, allergic rhinitis, reflux, and persistent thrush.  At today's visit, she reports that her symptoms of asthma had improved after she saw Dr. Lucie Leather in our office, however, have worsened over the last week with symptoms including shortness of breath which is worse with activity and cough that was productive and now is mostly dry.  She continues Breztri 2 puffs twice a day with a spacer and rarely uses albuterol.  Allergic rhinitis is reported as poorly controlled with symptoms that began about a week ago including clear rhinorrhea, nasal congestion, headache around both eyes, sneezing, and copious postnasal drainage with frequent throat clearing.  She continues cetirizine 10 mg once a day, saline nasal rinses followed by Flonase daily.  Reflux is reported as well-controlled with no symptoms including heartburn or vomiting.  She continues omeprazole 80 mg twice a day and famotidine 20 mg once a day.  She continues to experience frequent incidences of thrush despite using a spacer and rinsing after each use of her asthma maintenance inhaler.  She continues nystatin 5 mL once a day and Diflucan 150 mg once every other week with relief of symptoms.  Her current medications are listed in the chart.   Drug Allergies:  Allergies  Allergen Reactions   Valium [Diazepam]     Hypersensitivity - over sedated     Physical Exam: BP 118/62   Pulse 61   Temp 97.9 F (36.6 C)   Resp 20   Ht 5\' 8"  (1.727 m)   Wt 116 lb 9.6 oz  (52.9 kg)   SpO2 98%   BMI 17.73 kg/m    Physical Exam Vitals reviewed.  Constitutional:      Appearance: Normal appearance. She is well-developed.  HENT:     Head: Normocephalic and atraumatic.     Right Ear: Tympanic membrane normal.     Left Ear: Tympanic membrane normal.     Nose:     Comments: Bilateral nares slightly erythematous with clear nasal drainage noted.  Pharynx erythematous with no exudate.  Ears normal.  Eyes normal. Eyes:     Conjunctiva/sclera: Conjunctivae normal.  Cardiovascular:     Rate and Rhythm: Normal rate and regular rhythm.     Heart sounds: Normal heart sounds. No murmur heard. Pulmonary:     Effort: Pulmonary effort is normal.     Breath sounds: Normal breath sounds.     Comments: Lungs clear to auscultation Musculoskeletal:        General: Normal range of motion.     Cervical back: Normal range of motion and neck supple.  Skin:    General: Skin is warm and dry.  Neurological:     Mental Status: She is alert and oriented to person, place, and time.  Psychiatric:        Mood and Affect: Mood normal.        Behavior: Behavior normal.        Thought Content: Thought content normal.  Judgment: Judgment normal.     Diagnostics: FVC 1.34, FEV1 1.19.  Predicted FVC 3.09, predicted FEV1 2.31.  Spirometry indicates restriction.  Assessment and Plan: 1. Moderate persistent asthma with acute exacerbation   2. Other allergic rhinitis   3. LPRD (laryngopharyngeal reflux disease)   4. Thrush, oral     Meds ordered this encounter  Medications   predniSONE (DELTASONE) 10 MG tablet    Sig: Begin prednisone 10 mg tablets. Take 2 tablets twice a day for 3 days, then take 2 tablets once a day for 1 day, then take 1 tablet on the 5th day, then stop    Dispense:  15 tablet    Refill:  0   amoxicillin-clavulanate (AUGMENTIN) 875-125 MG tablet    Sig: Take 1 tablet by mouth 2 (two) times daily.    Dispense:  14 tablet    Refill:  0    Patient  Instructions  Asthma with acute exacerbation  Begin prednisone 10 mg tablets. Take 2 tablets twice a day for 3 days, then take 2 tablets once a day for 1 day, then take 1 tablet on the 5th day, then stop Continue Breztri 2 puffs twice a day with a spacer to prevent cough or wheeze Continue albuterol 2 puffs once every 4 hours as needed for cough or wheeze You may use albuterol 2 puffs 5 to 10 minutes before activity to decrease cough or wheeze  Allergic rhinitis with possible sinusitis Continue allergen avoidance measures  Continue cetirizine 10 mg once a day as needed for runny nose or itch Continue Flonase 2 sprays in each nostril once a day Consider saline nasal rinses as needed for nasal symptoms. Use this before any medicated nasal sprays for best result If no relief after aggressive nasal spray and saline rinses, then begin Augmentin 875 twice a day for 7 days, then stop  Reflux Continue dietary and lifestyle modifications as listed below Continue omeprazole 80 mg twice a day and famotidine 20 mg once a day  Thrush Continue nystatin 5 mg once every morning Continue Diflucan 150 mg once every other week  Call the clinic if this treatment plan is not working well for you.  Follow up in 1 month or sooner if needed   Return in about 4 weeks (around 09/17/2022), or if symptoms worsen or fail to improve.    Thank you for the opportunity to care for this patient.  Please do not hesitate to contact me with questions.  Thermon Leyland, FNP [facility]

## 2022-08-24 ENCOUNTER — Other Ambulatory Visit: Payer: Self-pay | Admitting: Cardiology

## 2022-09-18 ENCOUNTER — Other Ambulatory Visit: Payer: Self-pay | Admitting: Allergy and Immunology

## 2022-10-02 ENCOUNTER — Ambulatory Visit (INDEPENDENT_AMBULATORY_CARE_PROVIDER_SITE_OTHER): Payer: PPO | Admitting: Allergy and Immunology

## 2022-10-02 VITALS — BP 110/60 | HR 76 | Temp 98.0°F | Resp 20

## 2022-10-02 DIAGNOSIS — K219 Gastro-esophageal reflux disease without esophagitis: Secondary | ICD-10-CM

## 2022-10-02 DIAGNOSIS — B370 Candidal stomatitis: Secondary | ICD-10-CM

## 2022-10-02 DIAGNOSIS — J040 Acute laryngitis: Secondary | ICD-10-CM

## 2022-10-02 DIAGNOSIS — J3089 Other allergic rhinitis: Secondary | ICD-10-CM

## 2022-10-02 DIAGNOSIS — J4540 Moderate persistent asthma, uncomplicated: Secondary | ICD-10-CM | POA: Diagnosis not present

## 2022-10-02 MED ORDER — FLUTICASONE PROPIONATE 50 MCG/ACT NA SUSP: 2.0000 | Freq: Every day | NASAL | 1 refills | Status: DC

## 2022-10-02 MED ORDER — NYSTATIN 100000 UNIT/ML MT SUSP: 5.0000 mL | Freq: Every day | OROMUCOSAL | 5 refills | Status: DC | PRN

## 2022-10-02 MED ORDER — IPRATROPIUM BROMIDE 0.06 % NA SOLN
2.0000 | NASAL | 5 refills | Status: DC | PRN
Start: 2022-10-02 — End: 2023-02-12

## 2022-10-02 MED ORDER — FAMOTIDINE 20 MG PO TABS: 20.0000 mg | ORAL_TABLET | Freq: Every evening | ORAL | 1 refills | Status: DC

## 2022-10-02 MED ORDER — AZELASTINE HCL 137 MCG/SPRAY NA SOLN: 1.0000 | Freq: Two times a day (BID) | NASAL | 5 refills | Status: DC

## 2022-10-02 MED ORDER — BREZTRI AEROSPHERE 160-9-4.8 MCG/ACT IN AERO: 2.0000 | INHALATION_SPRAY | Freq: Two times a day (BID) | RESPIRATORY_TRACT | 5 refills | Status: DC

## 2022-10-02 MED ORDER — FLUCONAZOLE 150 MG PO TABS: 150.0000 mg | ORAL_TABLET | ORAL | 1 refills | Status: DC

## 2022-10-02 MED ORDER — ALBUTEROL SULFATE HFA 108 (90 BASE) MCG/ACT IN AERS: 2.0000 | INHALATION_SPRAY | Freq: Four times a day (QID) | RESPIRATORY_TRACT | 0 refills | Status: DC | PRN

## 2022-10-02 NOTE — Progress Notes (Unsigned)
[facility] [place]   Follow-up Note  Referring Provider: Lajean Manes, MD Primary Provider: Lajean Manes, MD Date of Office Visit: 10/02/2022  Subjective:   Victoria Trevino (DOB: February 22, 1941) is a 82 y.o. female who returns to the [facility] on 10/02/2022 in re-evaluation of the following:  HPI: Victoria Trevino returns to this clinic in evaluation of asthma and allergic rhinitis and reflux and recurrent/persistent thrush.  I last saw her in this clinic 07 August 2022.  She visited with our nurse practitioner on 20 August 2022 with an acute viral respiratory tract infection that fortunately appeared to have resolved.  She no longer has any coughing and no nasal symptoms and overall feels pretty good but she is left with persistent laryngitis.  Overall her asthma is under very good control and she does not have any shortness of breath and she does not require any albuterol when she uses her Breztri twice a day.  Overall her nose is doing very well while using Flonase on a consistent basis although she does have a persistent runny nose.  Her reflux is under good control on her current plan.  She has not had any problems with thrush while using her Diflucan and nystatin.  Allergies as of 10/02/2022       Reactions   Valium [diazepam]    Hypersensitivity - over sedated         Medication List    acetaminophen 500 MG tablet Commonly known as: TYLENOL Take 1,000 mg by mouth in the morning and at bedtime.   albuterol 108 (90 Base) MCG/ACT inhaler Commonly known as: Proventil HFA Inhale 2 puffs into the lungs every 6 (six) hours as needed for wheezing or shortness of breath.   aspirin EC 81 MG tablet Take 81 mg by mouth daily. Swallow whole.   Azelastine HCl 137 MCG/SPRAY Soln Place 2 sprays into both nostrils 2 (two) times daily.   Breztri Aerosphere 160-9-4.8 MCG/ACT Aero Generic drug: Budeson-Glycopyrrol-Formoterol 2  inhalations 1-2 times per day   Calcium Carbonate-Vitamin D 600-200 MG-UNIT Tabs Take 1 tablet by mouth daily.   donepezil 10 MG tablet Commonly known as: ARICEPT TAKE ONE TABLET BY MOUTH EVERYDAY AT BEDTIME   famotidine 20 MG tablet Commonly known as: Pepcid Take 1 tablet (20 mg total) by mouth at bedtime.   fluconazole 150 MG tablet Commonly known as: Diflucan Take 1 tablet (150 mg total) by mouth every 14 (fourteen) days.   fluticasone 50 MCG/ACT nasal spray Commonly known as: FLONASE USE ONE SPRAY IN EACH NOSTRIL ONCE DAILY   Fluzone High-Dose Quadrivalent 0.7 ML Susy Generic drug: Influenza Vac High-Dose Quad Inject 0.7 mLs into the skin once.   HYDROcodone-acetaminophen 5-325 MG tablet Commonly known as: NORCO/VICODIN Take 1 tablet by mouth every 4 (four) hours as needed for moderate pain.   JUICE PLUS FIBRE PO Take 2 capsules by mouth daily. Berry Blend   JUICE PLUS FIBRE PO Take 2 capsules by mouth daily. Vegetable Blend   Lumigan 0.01 % Soln Generic drug: bimatoprost Place 1 drop into both eyes at bedtime.   methocarbamol 500 MG tablet Commonly known as: ROBAXIN Take 1 tablet (500 mg total) by mouth every 8 (eight) hours as needed for muscle spasms.   nystatin 100000 UNIT/ML suspension Commonly known as: MYCOSTATIN Take 5 mLs (500,000 Units total) by mouth daily as needed.   omeprazole 40 MG capsule Commonly known as: PRILOSEC Take 2 capsules (80 mg  total) by mouth 2 (two) times daily.   predniSONE 10 MG tablet Commonly known as: DELTASONE Begin prednisone 10 mg tablets. Take 2 tablets twice a day for 3 days, then take 2 tablets once a day for 1 day, then take 1 tablet on the 5th day, then stop   PRESERVISION AREDS 2+MULTI VIT PO Take 1 capsule by mouth in the morning and at bedtime.   rosuvastatin 5 MG tablet Commonly known as: CRESTOR TAKE ONE TABLET BY MOUTH ONCE DAILY   SIMPLY SALINE NA Place 1 spray into the nose 2 (two) times daily.    SYSTANE OP Place 1 drop into both eyes as needed (dry eyes).   Vitamin D 50 MCG (2000 UT) tablet Take 2,000 Units by mouth daily.    Past Medical History:  Diagnosis Date   Allergic rhinoconjunctivitis 08/02/2015   Allergies    Arthritis    hands   Asthma    Dr Allena Katz   Asthma, well controlled 10/31/2017   Blepharitis    Coronary artery disease 06/21/2021   GERD (gastroesophageal reflux disease)    Glaucoma    Hereditary and idiopathic peripheral neuropathy 02/16/2016   Hx of fracture    Left wrist 2018; right patella (separate incident) 2018; used braces   Left wrist injury 01/18/2017   LPRD (laryngopharyngeal reflux disease) 08/02/2015   Macular degeneration    Memory loss    in study at [place], [place] 02/2020   Migraine    Moderate persistent asthma 08/02/2015   Osteopenia    Sensorimotor neuropathy    moderate-severe with chronic denervation- Dr Amedeo Gory, oral 10/31/2017   Vertigo    occasional positional    Past Surgical History:  Procedure Laterality Date   BUNIONECTOMY Right    Right great toe and little toe, pins in both, [place], [place]   CATARACT EXTRACTION, BILATERAL     with implants; R eye 08/2017 and L eye 09/2017   Cervical decompression and fusion  1997   COLONOSCOPY     2003, 2008, 2013   ECTOPIC PREGNANCY SURGERY     injection laryngeal plasty 2008     per notes from [facility]   KNEE SURGERY Left    meniscus @ [employer], [place], [place]   nissan infundibulm     SALPINGECTOMY Right    [place], [place]   SHOULDER ARTHROSCOPY WITH OPEN ROTATOR CUFF REPAIR AND DISTAL CLAVICLE ACROMINECTOMY Left 03/13/2022   Procedure: LEFT SHOULDER ARTHROSCOPY, WITH MINI OPEN ROTATOR CUFF TEAR REPAIR & BICEPS TENODESIS;  Surgeon: Meredith Pel, MD;  Location: [facility];  Service: Orthopedics;  Laterality: Left;   THROAT SURGERY     vocal cords injected with liquid bone  2005   [facility], [place]    Review of systems negative  except as noted in HPI / PMHx or noted below:  Review of Systems  Constitutional: Negative.   HENT: Negative.    Eyes: Negative.   Respiratory: Negative.    Cardiovascular: Negative.   Gastrointestinal: Negative.   Genitourinary: Negative.   Musculoskeletal: Negative.   Skin: Negative.   Neurological: Negative.   Endo/Heme/Allergies: Negative.   Psychiatric/Behavioral: Negative.       Objective:   Vitals:   10/02/22 0917  BP: 110/60  Pulse: 76  Resp: 20  Temp: 98 F (36.7 C)  SpO2: 98%          Physical Exam Constitutional:      Appearance: She is not diaphoretic.  HENT:  Head: Normocephalic.     Right Ear: Tympanic membrane, ear canal and external ear normal.     Left Ear: Tympanic membrane, ear canal and external ear normal.     Nose: Nose normal. No mucosal edema or rhinorrhea.     Mouth/Throat:     Pharynx: Uvula midline. No oropharyngeal exudate.  Eyes:     Conjunctiva/sclera: Conjunctivae normal.  Neck:     Thyroid: No thyromegaly.     Trachea: Trachea normal. No tracheal tenderness or tracheal deviation.  Cardiovascular:     Rate and Rhythm: Normal rate and regular rhythm.     Heart sounds: Normal heart sounds, S1 normal and S2 normal. No murmur heard. Pulmonary:     Effort: No respiratory distress.     Breath sounds: Normal breath sounds. No stridor. No wheezing or rales.  Lymphadenopathy:     Head:     Right side of head: No tonsillar adenopathy.     Left side of head: No tonsillar adenopathy.     Cervical: No cervical adenopathy.  Skin:    Findings: No erythema or rash.     Nails: There is no clubbing.  Neurological:     Mental Status: She is alert.     Diagnostics:    Spirometry was performed and demonstrated an FEV1 of 1.98 at 86 % of predicted.  Assessment and Plan:   1. Asthma, moderate persistent, well-controlled   2. Other allergic rhinitis   3. LPRD (laryngopharyngeal reflux disease)   4. Thrush, oral   5. Laryngitis     1. Continue Breztri - 2 inhalations 1-2 times per day   2. Continue omeprazole 80 mg in AM + 80 mg in PM + famotidine 20 mg in evening  3. Continue nasal fluticasone 1-2 spray each nostril 1 time per day  4. Continue nystatin oral solution 1 teaspoon swish and swallow every morning + Diflucan 150 mg tablet every other week  5. May use OTC Mucinex DM, azelastine, and ProAir HFA as needed  6. Can use nasal ipratropium 0.06% - 2 sprays each nostril every 6 hours to dry nose  7. For laryngitis:   A. Decrease Breztri to 1 time a day use.  B. Contact clinic with update in 2 weeks  C. Evaluation of vocal cords and chest X-ray???  8. Return to clinic in 6 months or earlier if problem  [place] appears to have some persistent laryngitis after her viral respiratory tract infection and the question at hand is whether or not she has damaged her vocal cords with her coughing episodes or she is having some myopathy from the use of her Breztri twice a day in conjunction with that recent viral or event.  We will have her decrease her Breztri to 1 time per day for the next 2 weeks and she will contact this clinic and we will make a decision about pursuing further evaluation of her laryngitis with rhinoscopy and chest x-ray.  Overall she has done really well other than her viral respiratory tract infections on her current plan of addressing inflammation of her airway and reflux.  Will see her back in this clinic in 6 months or earlier if there is a problem.  Allena Katz, MD Allergy / Immunology [place]

## 2022-10-02 NOTE — Patient Instructions (Addendum)
   1. Continue Breztri - 2 inhalations 1-2 times per day   2. Continue omeprazole 80 mg in AM + 80 mg in PM + famotidine 20 mg in evening  3. Continue nasal fluticasone 1-2 spray each nostril 1 time per day  4. Continue nystatin oral solution 1 teaspoon swish and swallow every morning + Diflucan 150 mg tablet every other week  5. May use OTC Mucinex DM, azelastine, and ProAir HFA as needed  6. Can use nasal ipratropium 0.06% - 2 sprays each nostril every 6 hours to dry nose  7. For laryngitis:   A. Decrease Breztri to 1 time a day use.  B. Contact clinic with update in 2 weeks  C. Evaluation of vocal cords and chest X-ray???  8. Return to clinic in 6 months or earlier if problem

## 2022-10-03 ENCOUNTER — Encounter: Payer: Self-pay | Admitting: Allergy and Immunology

## 2022-10-09 ENCOUNTER — Other Ambulatory Visit: Payer: Self-pay | Admitting: Cardiology

## 2022-10-17 ENCOUNTER — Telehealth: Payer: Self-pay | Admitting: Allergy and Immunology

## 2022-10-17 NOTE — Telephone Encounter (Signed)
Pt called to give an update: Pt states she has a voice, it is not white what it normally is but her voice is coming back.   Best contact number: [phone]

## 2022-10-17 NOTE — Telephone Encounter (Signed)
Called and informed patient, patient verbalized understanding.

## 2022-10-19 ENCOUNTER — Encounter: Payer: Self-pay | Admitting: Cardiology

## 2022-10-19 ENCOUNTER — Ambulatory Visit: Payer: PPO | Attending: Cardiology | Admitting: Cardiology

## 2022-10-19 VITALS — BP 108/60 | HR 62 | Ht 68.0 in | Wt 118.2 lb

## 2022-10-19 DIAGNOSIS — E785 Hyperlipidemia, unspecified: Secondary | ICD-10-CM | POA: Diagnosis not present

## 2022-10-19 DIAGNOSIS — I2510 Atherosclerotic heart disease of native coronary artery without angina pectoris: Secondary | ICD-10-CM

## 2022-10-19 DIAGNOSIS — K219 Gastro-esophageal reflux disease without esophagitis: Secondary | ICD-10-CM | POA: Diagnosis not present

## 2022-10-19 NOTE — Patient Instructions (Signed)
Medication Instructions:   Your physician recommends that you continue on your current medications as directed. Please refer to the Current Medication list given to you today.   *If you need a refill on your cardiac medications before your next appointment, please call your pharmacy*   Lab Work:  None ordered.  If you have labs (blood work) drawn today and your tests are completely normal, you will receive your results only by: [place] (if you have MyChart) OR A paper copy in the mail If you have any lab test that is abnormal or we need to change your treatment, we will call you to review the results.   Testing/Procedures:  None ordered.   Follow-Up: At [facility], you and your health needs are our priority.  As part of our continuing mission to provide you with exceptional heart care, we have created designated Provider Care Teams.  These Care Teams include your primary Cardiologist (physician) and Advanced Practice Providers (APPs -  Physician Assistants and Nurse Practitioners) who all work together to provide you with the care you need, when you need it.  We recommend signing up for the patient portal called "MyChart".  Sign up information is provided on this After Visit Summary.  MyChart is used to connect with patients for Virtual Visits (Telemedicine).  Patients are able to view lab/test results, encounter notes, upcoming appointments, etc.  Non-urgent messages can be sent to your provider as well.   To learn more about what you can do with MyChart, go to [email or web address].    Your next appointment:   1 year(s)  Provider:   Candee Furbish, MD     Other Instructions  Your physician wants you to follow-up in: 1 year with Dr. Marlou Porch. You will receive a reminder letter in the mail two months in advance. If you don't receive a letter, please call our office to schedule the follow-up appointment.

## 2022-10-19 NOTE — Progress Notes (Signed)
Date:  10/19/2022   ID:  Victoria Trevino, DOB Feb 06, 1941, MRN [id number] PCP:  Charlane Ferretti, MD  Cardiologist:  Candee Furbish, MD  Electrophysiologist:  None   Evaluation Performed: Follow-up visit   History of Present Illness:    Victoria Trevino is a 82 y.o. female with asthma, vocal cord dysfunction under good control with Symbicort here for follow-up of mild coronary artery disease seen on CT scan in 11-28-18.  Rosuvastatin 5 mg initiated.  Plaque stabilization.  Excellent LDL.   No DM, no tobacco. Mother had Alzheimers, 105. Doing [employer]. Father had heart issues, died at 6. MI 48.   Her husband died in Nov 28, 2015, heart attack.  We also discussed her brother who died of lung cancer.  In early her age she asked him to quit smoking.  He said he could not.  She was with him at his bedside in [place] when he died.  Has been seen in neurology, most recently 01/05/2019 via telemedicine with idiopathic peripheral neuropathy affecting the feet and lower legs treated with lidocaine ointment.  Physical activity.  Prior carotid Dopplers were normal Denies any fevers chills nausea vomiting syncope bleeding.  Still battling with the hoarseness of her voice.  She is trying to decrease some of the inhalers.  She also takes Prilosec.  Past Medical History:  Diagnosis Date   Allergic rhinoconjunctivitis 08/02/2015   Allergies    Arthritis    hands   Asthma    Dr Allena Katz   Asthma, well controlled 10/31/2017   Blepharitis    Coronary artery disease 06/21/2021   GERD (gastroesophageal reflux disease)    Glaucoma    Hereditary and idiopathic peripheral neuropathy 02/16/2016   Hx of fracture    Left wrist Nov 27, 2016; right patella (separate incident) 11-27-2016; used braces   Left wrist injury 01/18/2017   LPRD (laryngopharyngeal reflux disease) 08/02/2015   Macular degeneration    Memory loss    in study at [place], [place] 02/2020   Migraine    Moderate persistent asthma 08/02/2015    Osteopenia    Sensorimotor neuropathy    moderate-severe with chronic denervation- Dr Amedeo Gory, oral 10/31/2017   Vertigo    occasional positional   Past Surgical History:  Procedure Laterality Date   BUNIONECTOMY Right    Right great toe and little toe, pins in both, [place], [place]   CATARACT EXTRACTION, BILATERAL     with implants; R eye 08/2017 and L eye 09/2017   Cervical decompression and fusion  1997   COLONOSCOPY     2003, 2008, 11/28/2011   ECTOPIC PREGNANCY SURGERY     injection laryngeal plasty Nov 28, 2006     per notes from [facility]   KNEE SURGERY Left    meniscus @ [employer], [place], [place]   nissan infundibulm     SALPINGECTOMY Right    [place], [place]   SHOULDER ARTHROSCOPY WITH OPEN ROTATOR CUFF REPAIR AND DISTAL CLAVICLE ACROMINECTOMY Left 03/13/2022   Procedure: LEFT SHOULDER ARTHROSCOPY, WITH MINI OPEN ROTATOR CUFF TEAR REPAIR & BICEPS TENODESIS;  Surgeon: Meredith Pel, MD;  Location: [facility];  Service: Orthopedics;  Laterality: Left;   THROAT SURGERY     vocal cords injected with liquid bone  11-28-03   [facility], [place]     No outpatient medications have been marked as taking for the 10/19/22 encounter (Office Visit) with Jerline Pain, MD.     Allergies:  Valium [diazepam]   Social History   Tobacco Use   Smoking status: Never   Smokeless tobacco: Never  Vaping Use   Vaping Use: Never used  Substance Use Topics   Alcohol use: Not Currently    Comment: was a glass of wine one time per year   Drug use: Never     Family Hx: The patient's family history includes Allergic rhinitis in her sister; Alzheimer's disease in her mother; Aneurysm in her brother; Asthma in her daughter; Benign prostatic hyperplasia in her father; CAD in her father and paternal grandfather; COPD in her brother; Congestive Heart Failure in her brother and father; Dementia in her maternal grandfather and maternal grandmother; Diabetes in her daughter and son;  Emphysema in her brother and father; Heart attack in her father; Hypercholesterolemia in her mother and sister; Hypertension in her sister and son; Irritable bowel syndrome in her sister; Memory loss in her brother; Obesity in her sister; Prostate cancer in her maternal grandfather; Skin cancer in her brother and sister; Stroke in her maternal grandmother and paternal grandmother.  ROS:   Please see the history of present illness.    No CP All other systems reviewed and are negative.   Prior CV studies:   The following studies were reviewed today:  Carotid Dopplers 10/27/2018: -No carotid artery disease.  Coronary CT 2020: 1. Coronary calcium score of 74. This was 73 percentile for age and sex matched control.   2. Normal coronary origin with right dominance.   3. Mild non-obstructive CAD. Risk factor modification is recommended. Labs/Other Tests and Data Reviewed:    EKG: 10/19/22 NSR 62    Recent Labs: 03/13/2022: BUN 24; Creatinine, Ser 0.89; Hemoglobin 13.8; Platelets 156; Potassium 4.3; Sodium 140   Recent Lipid Panel Lab Results  Component Value Date/Time   CHOL 129 05/28/2019 11:00 AM   TRIG 71 05/28/2019 11:00 AM   HDL 76 05/28/2019 11:00 AM   CHOLHDL 1.7 05/28/2019 11:00 AM   LDLCALC 39 05/28/2019 11:00 AM    Wt Readings from Last 3 Encounters:  10/19/22 118 lb 3.2 oz (53.6 kg)  08/20/22 116 lb 9.6 oz (52.9 kg)  08/07/22 117 lb 12.8 oz (53.4 kg)     Objective:    Vital Signs:  BP 108/60   Pulse 62   Ht 5' 8"$  (1.727 m)   Wt 118 lb 3.2 oz (53.6 kg)   SpO2 99%   BMI 17.97 kg/m    VITAL SIGNS:  reviewed GEN:  no acute distress EYES:  sclerae anicteric, EOMI - Extraocular Movements Intact RESPIRATORY:  normal respiratory effort, symmetric expansion SKIN:  no rash, lesions or ulcers. MUSCULOSKELETAL:  no obvious deformities. NEURO:  alert and oriented x 3, no obvious focal deficit PSYCH:  normal affect  ASSESSMENT & PLAN:    Mild coronary artery  disease/plaque -Coronary CT reviewed as above 2020.  Continue with low-dose Crestor 5 mg once a day.  Excellent LDL at goal currently 40.  Continue with low-dose aspirin as well.  No anginal symptoms.  Family history of CAD -Father with myocardial infarction in his 27s.  Reviewed  Asthma - Her symptoms are different from her asthma.  Her asthma seems to be under good control with Dr. Neldon Mc.  Working on vocal cord dysfunction/hoarseness.  On Prilosec as well.    Medication Adjustments/Labs and Tests Ordered: Current medicines are reviewed at length with the patient today.  Concerns regarding medicines are outlined above.   Tests Ordered: Orders Placed  This Encounter  Procedures   EKG 12-Lead    Medication Changes: No orders of the defined types were placed in this encounter.   Disposition: 1 year follow-up  Signed, Candee Furbish, MD  10/19/2022 8:59 AM    [facility]

## 2022-10-30 DIAGNOSIS — Z1211 Encounter for screening for malignant neoplasm of colon: Secondary | ICD-10-CM | POA: Diagnosis not present

## 2022-10-30 DIAGNOSIS — Z1212 Encounter for screening for malignant neoplasm of rectum: Secondary | ICD-10-CM | POA: Diagnosis not present

## 2022-10-31 LAB — COLOGUARD: Cologuard: NEGATIVE

## 2022-11-08 ENCOUNTER — Other Ambulatory Visit: Payer: Self-pay | Admitting: Cardiology

## 2022-11-08 DIAGNOSIS — E441 Mild protein-calorie malnutrition: Secondary | ICD-10-CM | POA: Diagnosis not present

## 2022-11-08 DIAGNOSIS — K9089 Other intestinal malabsorption: Secondary | ICD-10-CM | POA: Diagnosis not present

## 2022-11-14 DIAGNOSIS — R21 Rash and other nonspecific skin eruption: Secondary | ICD-10-CM | POA: Diagnosis not present

## 2022-11-14 DIAGNOSIS — L570 Actinic keratosis: Secondary | ICD-10-CM | POA: Diagnosis not present

## 2022-11-28 ENCOUNTER — Ambulatory Visit: Payer: PPO | Admitting: Neurology

## 2022-11-28 ENCOUNTER — Encounter: Payer: Self-pay | Admitting: Neurology

## 2022-11-28 VITALS — BP 123/62 | HR 64 | Ht 67.0 in | Wt 119.4 lb

## 2022-11-28 DIAGNOSIS — R4189 Other symptoms and signs involving cognitive functions and awareness: Secondary | ICD-10-CM

## 2022-11-28 NOTE — Progress Notes (Signed)
[id number]    Provider:  Dr Jaynee Eagles Requesting Provider: Lajean Manes, MD Primary Care Provider:  Charlane Ferretti, MD  CC:  Memory loss  11/28/2022: She feels she is stable. She forgets people's names. She has been very active in [employer]. Very social. No depression or anxiety. No accidents in the home or in her car. Still doing all her IADLs or ADLS. Not missing any bills. She does most of her things online. She lives alone. Son lives behind her.   Patient complains of symptoms per HPI as well as the following symptoms: none . Pertinent negatives and positives per HPI. All others negative    11/23/2021: Spent 20 minutes reviewing Dr. Ferne Coe notes, essentially he did not contact neurodegenerative disorder, overall the results were quite positive, patient's MMSE is stable, we discussed in detail, I reassured patient, she is to see Dr. Sima Matas again in 9 months, I spent 12 additional minutes with patient answering any questions, reassuring her, she was very quite happy.  At this time she can follow-up as needed, I will continue to follow when she has update with Dr. Sima Matas.  Dr. Ferne Coe notes can be read in epic.  Patient complains of symptoms per HPI as well as the following symptoms: memory loss . Pertinent negatives and positives per HPI. All others negative   05/25/2021: She has an appointment with Dr. Sima Matas next week. She is concerned about memory. She puts her cell phone down and forgets where she put it so she bought a tool belt to put her phone in it. Patient is stable today, I tried to reassure her that people with dementia progress and she does not seem to be progressing.Marland Kitchen  Here independently, she performs all her IADls and ADLs. 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 Her MMSE is stable. She lives right next door to her son who she says is wonderful  and she is enjoying life and gardening and loves being outdoors. She loves to read, she watches TV, she loves [employer], she is active, no depression or anxiety.   HPI:  Victoria Trevino is a 82 y.o. female here as requested by Lajean Manes, MD for memory loss.  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 Posey Pronto in the past, benign essential tremor, memory lost diagnosed with mild cognitive impairment June 2021 at [facility], I reviewed "care everywhere" but any research notes are not available for me to review.  I reviewed Dr. Carlyle Lipa notes, she has a history of Alzheimer's disease, she is in a study at [facility] and they have told her she has some concerning memory loss, she still independent in all of her activities of daily living, she is under some stress at home as she is caring for a family member who has definite cognitive limitations for dementia and this has been a difficult management problem for her. She has been with [facility] since 2015 participating in drug studies.   Patient is here with son. She has had a lot of work up at [facility]. Her memory is not as good as it once was. They have not said anything to her about the MRIs, the amyloid testing, she doesn't get to know that. She has had extensive testing at [facility]. They have  told her she has progressed to the point she has Alzheimer's disease. I discussed going back to [facility] because I do not have access to any of their research results. She is having difficulty with dates. Here with her son. She can compensate. She doesn't miss any of her medications. She uses a calendar. 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 Her son says she has a routine. She doesn't get lost. She  performs her own ADLs and IADLs. She lives independently, her son lives close by and checks on her daily. She gardens, she has a Firefighter garden. She is worried that she doesn't get prevention. Her mother died at 57. Mother had Alzheimer,  her grandmother was in her 41s with Alzheimers. Still driving, still doing well, more short-term memory. Good insight and judgement. 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 She has only fallen twice that she knows of, mechanical fall and once when she had the flu, she works very hard due to her imbalance (she has neuropathy). She eats very healthy.   Reviewed notes, labs and imaging from outside physicians, which showed:   Vitamin D was 85.2, CMP was normal with BUN 17 and creatinine 0.81, CBC was normal, B12 472, magnesium 2.3, February 05, 2020.   Review of Systems: Patient complains of symptoms per HPI as well as the following symptoms Alzheimer's. Pertinent negatives and positives per HPI. All others negative.   Social History   Socioeconomic History   Marital status: Widowed    Spouse name: Not on file   Number of children: Not on file   Years of education: 3 yrs nursing school after H.S.   Highest education level: Not on file  Occupational History   Not on file  Tobacco Use   Smoking status: Never   Smokeless tobacco: Never  Vaping Use   Vaping Use: Never used  Substance and Sexual Activity   Alcohol use: Not Currently    Comment: was a glass of wine one time per year   Drug use: Never   Sexual activity: Yes    Birth control/protection: Condom  Other Topics Concern   Not on file  Social History Narrative   Lives alone in a one story home.  Has 2 children.  Retired Therapist, sports.  Husband passed away in 05-Nov-2016.        Son lives next to her   Caffeine: NONE since 2000, was previously 1 cup coffee/day from 1969-2000   Social Determinants of Health   Financial Resource Strain: Not on file  Food Insecurity: Not on file   Transportation Needs: Not on file  Physical Activity: Not on file  Stress: Not on file  Social Connections: Not on file  Intimate Partner Violence: Not on file    Family History  Problem Relation Age of Onset   Alzheimer's disease Mother        Deceased, 13   Hypercholesterolemia Mother    Heart attack Father    Congestive Heart Failure Father        Deceased, 58   Emphysema Father        smoker   CAD Father    Benign prostatic hyperplasia Father    Skin cancer Sister    Allergic rhinitis Sister    Hypertension Sister    Hypercholesterolemia Sister    Obesity Sister  Irritable bowel syndrome Sister    Aneurysm Brother    Skin cancer Brother        basal cell   Memory loss Brother    COPD Brother    Congestive Heart Failure Brother    Emphysema Brother    Stroke Maternal Grandmother    Dementia Maternal Grandmother    Prostate cancer Maternal Grandfather    Dementia Maternal Grandfather    Diabetes Son    Hypertension Son    Diabetes Daughter    Asthma Daughter    Stroke Paternal Grandmother    CAD Paternal Grandfather     Past Medical History:  Diagnosis Date   Allergic rhinoconjunctivitis 08/02/2015   Allergies    Arthritis    hands   Asthma    Dr Allena Katz   Asthma, well controlled 10/31/2017   Blepharitis    Coronary artery disease 06/21/2021   GERD (gastroesophageal reflux disease)    Glaucoma    Hereditary and idiopathic peripheral neuropathy 02/16/2016   Hx of fracture    Left wrist 2018; right patella (separate incident) 2018; used braces   Left wrist injury 01/18/2017   LPRD (laryngopharyngeal reflux disease) 08/02/2015   Macular degeneration    Memory loss    in study at [place], [place] 02/2020   Migraine    Moderate persistent asthma 08/02/2015   Osteopenia    Sensorimotor neuropathy    moderate-severe with chronic denervation- Dr Amedeo Gory, oral 10/31/2017   Vertigo    occasional positional    Patient Active Problem List    Diagnosis Date Noted   Complete tear of left rotator cuff    Biceps tendonitis on left    Synovitis of left shoulder    Age-related memory disorder 11/27/2021   Acute cough 08/04/2021   Not well controlled mild persistent asthma 08/04/2021   Other allergic rhinitis 08/04/2021   MCI (mild cognitive impairment) 01/12/2021   Asthma, well controlled 10/31/2017   Thrush, oral 10/31/2017   Left wrist injury 01/18/2017   Hereditary and idiopathic peripheral neuropathy 02/16/2016   LPRD (laryngopharyngeal reflux disease) 08/02/2015   Allergic rhinoconjunctivitis 08/02/2015   Moderate persistent asthma 08/02/2015    Past Surgical History:  Procedure Laterality Date   BUNIONECTOMY Right    Right great toe and little toe, pins in both, [place], [place]   CATARACT EXTRACTION, BILATERAL     with implants; R eye 08/2017 and L eye 09/2017   Cervical decompression and fusion  1997   COLONOSCOPY     2003, 2008, 2013   ECTOPIC PREGNANCY SURGERY     injection laryngeal plasty 2008     per notes from [facility]   KNEE SURGERY Left    meniscus @ [employer], [place], [place]   nissan infundibulm     SALPINGECTOMY Right    [place], [place]   SHOULDER ARTHROSCOPY WITH OPEN ROTATOR CUFF REPAIR AND DISTAL CLAVICLE ACROMINECTOMY Left 03/13/2022   Procedure: LEFT SHOULDER ARTHROSCOPY, WITH MINI OPEN ROTATOR CUFF TEAR REPAIR & BICEPS TENODESIS;  Surgeon: Meredith Pel, MD;  Location: [facility];  Service: Orthopedics;  Laterality: Left;   THROAT SURGERY     vocal cords injected with liquid bone  2005   [place], [place]    Current Outpatient Medications  Medication Sig Dispense Refill   acetaminophen (TYLENOL) 500 MG tablet Take 500 mg by mouth in the morning and at bedtime.     albuterol (PROVENTIL HFA) 108 (90 Base) MCG/ACT inhaler Inhale  2 puffs into the lungs every 6 (six) hours as needed for wheezing or shortness of breath. 54 g 0   aspirin EC 81 MG tablet Take 81 mg by mouth  daily. Swallow whole.     Azelastine HCl 137 MCG/SPRAY SOLN Place 1 spray into both nostrils 2 (two) times daily. 30 mL 5   bimatoprost (LUMIGAN) 0.01 % SOLN Place 1 drop into both eyes at bedtime.     Budeson-Glycopyrrol-Formoterol (BREZTRI AEROSPHERE) 160-9-4.8 MCG/ACT AERO Inhale 2 puffs into the lungs in the morning and at bedtime. 2 inhalations 1-2 times per day (Patient taking differently: Inhale 2 puffs into the lungs in the morning and at bedtime. 2 inhalations 1 times per day) 10.7 g 5   Calcium Carbonate-Vitamin D 600-200 MG-UNIT TABS Take 1 tablet by mouth daily.     Cholecalciferol (VITAMIN D) 50 MCG (2000 UT) tablet Take 2,000 Units by mouth daily.     donepezil (ARICEPT) 10 MG tablet TAKE ONE TABLET BY MOUTH EVERYDAY AT BEDTIME 90 tablet 0   famotidine (PEPCID) 20 MG tablet Take 1 tablet (20 mg total) by mouth at bedtime. 90 tablet 1   fluconazole (DIFLUCAN) 150 MG tablet Take 1 tablet (150 mg total) by mouth every 14 (fourteen) days. 30 tablet 1   fluticasone (FLONASE) 50 MCG/ACT nasal spray Place 2 sprays into both nostrils daily. USE ONE SPRAY IN EACH NOSTRIL ONCE DAILY 48 g 1   ipratropium (ATROVENT) 0.06 % nasal spray Place 2 sprays into both nostrils every 4 (four) hours as needed for rhinitis. 15 mL 5   Multiple Vitamins-Minerals (PRESERVISION AREDS 2+MULTI VIT PO) Take 1 capsule by mouth in the morning and at bedtime.     omeprazole (PRILOSEC) 40 MG capsule Take 2 capsules (80 mg total) by mouth 2 (two) times daily. 120 capsule 5   Polyethyl Glycol-Propyl Glycol (SYSTANE OP) Place 1 drop into both eyes as needed (dry eyes).     rosuvastatin (CRESTOR) 5 MG tablet TAKE ONE TABLET BY MOUTH ONCE DAILY 90 tablet 3   SIMPLY SALINE NA Place 1 spray into the nose 2 (two) times daily.     No current facility-administered medications for this visit.    Allergies as of 11/28/2022 - Review Complete 11/28/2022  Allergen Reaction Noted   Valium [diazepam]  11/25/2019    Vitals: BP  123/62   Pulse 64   Ht 5\' 7"  (1.702 m)   Wt 119 lb 6.4 oz (54.2 kg)   BMI 18.70 kg/m  Last Weight:  Wt Readings from Last 1 Encounters:  11/28/22 119 lb 6.4 oz (54.2 kg)   Last Height:   Ht Readings from Last 1 Encounters:  11/28/22 5\' 7"  (1.702 m)     Physical exam: Exam: Gen: NAD, conversant, well nourised, thin, well groomed                     CV: RRR, no MRG. No Carotid Bruits. No peripheral edema, warm, nontender Eyes: Conjunctivae clear without exudates or hemorrhage  Neuro: Detailed Neurologic Exam  Speech:    Speech is normal; fluent and spontaneous with normal comprehension.  Cognition:     11/28/2022    1:12 PM 11/23/2021    1:28 PM 05/25/2021   12:58 PM  MMSE - Mini Mental State Exam  Orientation to time 4 4 4   Orientation to Place 5 5 5   Registration 3 3 3   Attention/ Calculation 5 5 4   Recall 3 2 3   Language-  name 2 objects 2 2 2   Language- repeat 1 1 1   Language- follow 3 step command 3 3 3   Language- read & follow direction 1 1 1   Write a sentence 1 1 1   Copy design 1 1 1   Total score 29 28 28    Exam: NAD, pleasant                  Speech:    Speech is normal; fluent and spontaneous with normal comprehension.  Cognition:    The patient is oriented to person, place, and time;     recent and remote memory intact;     language fluent;    Cranial Nerves:    The pupils are equal, round, and reactive to light.Trigeminal sensation is intact and the muscles of mastication are normal. The face is symmetric. The palate elevates in the midline. Hearing intact. Voice is normal. Shoulder shrug is normal. The tongue has normal motion without fasciculations.   Coordination:  No dysmetria  Motor Observation:    No asymmetry, no atrophy, and no involuntary movements noted. Tone:    Normal muscle tone.     Strength:    Strength is V/V in the upper and lower limbs.      Sensation: intact to LT    Assessment/Plan:   Extremely lovely 82 y.o. female  here as requested by Lajean Manes, MD for memory loss.  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 Posey Pronto in the past, benign essential tremor, memory loss.Extensive FHx on both sides of alzheimers.The [facility] team told her to seek care because they are concerned with her progression.  MMSE is stable. 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 She has etensive FHx of dementia and was worried. Dr. Ferne Coe evaluation did not detect a neurodegenerative disorder, reassured patient.  She will follow-up with Dr. Sima Matas.  - She has been involved with the Healthy Brain Study since 2015 and had extensive testing. Since that is a research protocol I do not have access to all the testing and I discussed this with her and son at last visit, she could go to [facility] and see if they can release more information.I did call and discuss with research team, see below  - She has excellent judgement and insight and she is performing all her own ADLs and IADLs. She is just lovely.  Again, reviewed Dr. Cloyde Reams those findings, reassured patient, she can follow-up as needed.  - She has had MRIs, amyloid scans, lab testing. None further.   -Recommended "The XX Brain" Lattie Haw Mosconi  - Continue Aricept 10mg .  She feels is helping.  - Follow up as needed  Addendum: 01/12/2021: I spoke to Merleen Nicely who is the Geophysicist/field seismologist [facility] [id number].  Patient had a amyloid PET scan which showed positive amyloid frontal lateral medial October 2021.  She did not have any information on MRI of the brain but she did have formal memory testing in January 2022 which showed mild cognitive impairment.  However due to research protocol I do not think I can  be provided with the scans on CD or the full reports, patient can bring the results that she received in the mail which are basically the above.  Much appreciate our  colleagues at [facility].  Patient did say when she last had an appointment she was told its not mild cognitive impairment but that it is Alzheimer's, I do think at this time we should proceed with formal neurocognitive testing so that we have our own baseline and patient can continue to follow with [place] instead of [facility].I spoke to patient and explained the above. She was relieved they told me MCI and I explained MCi does not have to progress to Alzheimers. Lovely patient.      Cc: Lajean Manes, MD,  Charlane Ferretti, MD  Sarina Ill, MD  [facility] [address] [place], [place] [address]  Phone [phone] Fax [phone]  I spent 30 minutes of face-to-face and non-face-to-face time with patient on the  1. Subjective memory complaints     diagnosis.  This included previsit chart review, lab review, study review, order entry, electronic health record documentation, patient education on the different diagnostic and therapeutic options, counseling and coordination of care, risks and benefits of management, compliance, or risk factor reduction

## 2022-11-29 ENCOUNTER — Ambulatory Visit: Payer: PPO | Admitting: Neurology

## 2022-12-11 DIAGNOSIS — H26491 Other secondary cataract, right eye: Secondary | ICD-10-CM | POA: Diagnosis not present

## 2022-12-11 DIAGNOSIS — H18593 Other hereditary corneal dystrophies, bilateral: Secondary | ICD-10-CM | POA: Diagnosis not present

## 2022-12-11 DIAGNOSIS — H0100A Unspecified blepharitis right eye, upper and lower eyelids: Secondary | ICD-10-CM | POA: Diagnosis not present

## 2022-12-11 DIAGNOSIS — H52203 Unspecified astigmatism, bilateral: Secondary | ICD-10-CM | POA: Diagnosis not present

## 2022-12-11 DIAGNOSIS — H524 Presbyopia: Secondary | ICD-10-CM | POA: Diagnosis not present

## 2022-12-11 DIAGNOSIS — H5203 Hypermetropia, bilateral: Secondary | ICD-10-CM | POA: Diagnosis not present

## 2022-12-11 DIAGNOSIS — H401213 Low-tension glaucoma, right eye, severe stage: Secondary | ICD-10-CM | POA: Diagnosis not present

## 2022-12-11 DIAGNOSIS — H353132 Nonexudative age-related macular degeneration, bilateral, intermediate dry stage: Secondary | ICD-10-CM | POA: Diagnosis not present

## 2022-12-11 DIAGNOSIS — H0100B Unspecified blepharitis left eye, upper and lower eyelids: Secondary | ICD-10-CM | POA: Diagnosis not present

## 2023-02-12 ENCOUNTER — Ambulatory Visit (INDEPENDENT_AMBULATORY_CARE_PROVIDER_SITE_OTHER): Payer: PPO | Admitting: Allergy and Immunology

## 2023-02-12 VITALS — BP 104/60 | HR 58 | Temp 98.0°F | Resp 18 | Ht 67.25 in | Wt 117.8 lb

## 2023-02-12 DIAGNOSIS — J3089 Other allergic rhinitis: Secondary | ICD-10-CM | POA: Diagnosis not present

## 2023-02-12 DIAGNOSIS — K219 Gastro-esophageal reflux disease without esophagitis: Secondary | ICD-10-CM | POA: Diagnosis not present

## 2023-02-12 DIAGNOSIS — J4540 Moderate persistent asthma, uncomplicated: Secondary | ICD-10-CM | POA: Diagnosis not present

## 2023-02-12 DIAGNOSIS — B370 Candidal stomatitis: Secondary | ICD-10-CM | POA: Diagnosis not present

## 2023-02-12 MED ORDER — FLUTICASONE PROPIONATE 50 MCG/ACT NA SUSP: 1.0000 | Freq: Every day | NASAL | 1 refills | Status: DC

## 2023-02-12 MED ORDER — OMEPRAZOLE 40 MG PO CPDR: 80.0000 mg | DELAYED_RELEASE_CAPSULE | Freq: Two times a day (BID) | ORAL | 5 refills | Status: DC

## 2023-02-12 MED ORDER — IPRATROPIUM BROMIDE 0.06 % NA SOLN: NASAL | 1 refills | Status: DC

## 2023-02-12 MED ORDER — FLUCONAZOLE 150 MG PO TABS: ORAL_TABLET | ORAL | 1 refills | Status: DC

## 2023-02-12 MED ORDER — NYSTATIN 100000 UNIT/ML MT SUSP: OROMUCOSAL | 5 refills | Status: DC

## 2023-02-12 MED ORDER — BREZTRI AEROSPHERE 160-9-4.8 MCG/ACT IN AERO: INHALATION_SPRAY | RESPIRATORY_TRACT | 5 refills | Status: DC

## 2023-02-12 MED ORDER — ALBUTEROL SULFATE HFA 108 (90 BASE) MCG/ACT IN AERS: 2.0000 | INHALATION_SPRAY | Freq: Four times a day (QID) | RESPIRATORY_TRACT | 1 refills | Status: DC | PRN

## 2023-02-12 NOTE — Progress Notes (Unsigned)
[place] - [place] - [place] - [place] - [place]   Follow-up Note  Referring Provider: Merlene Laughter, MD Primary Provider: Thana Ates, MD Date of Office Visit: 02/12/2023  Subjective:   Victoria Trevino (DOB: [birth year]) is a 82 y.o. female who [facility] in re-evaluation of the following:  HPI: Victoria Trevino presents to this clinic in evaluation of asthma and allergic rhinitis and reflux and recurrent thrush and Breztri induced laryngitis.  I last saw her in this clinic 02 October 2022.  During her last visit she appeared to develop an acute viral respiratory tract infection in December 2023 that we treated with good resolution but she continued to have persistent laryngitis and we had her decrease her Breztri to 1 time per day use.  While using Breztri at 1 time per day she has had improvement regarding her voice and she can go through most of the day without any problems with her voice although she uses her voice a lot she does get some voice fatigue later in the day.  And she has had good control of her asthma while using her Breztri once a day and does not need to use any short acting bronchodilator.  She had very little problems with her nose while using Flonase.  She had very little problems with her reflux while using a very aggressive plan directed against reflux.  She had very little problems with recurrent thrush on a combination of Diflucan and nystatin use chronically.  Allergies as of 02/12/2023       Reactions   Valium [diazepam]    Hypersensitivity - over sedated         Medication List    acetaminophen 500 MG tablet Commonly known as: TYLENOL Take 500 mg by mouth in the morning and at bedtime.   albuterol 108 (90 Base) MCG/ACT inhaler Commonly known as: Proventil HFA Inhale 2 puffs into the lungs every 6 (six) hours as needed for wheezing or shortness of breath.   aspirin EC 81 MG tablet Take 81 mg by mouth  daily. Swallow whole.   Azelastine HCl 137 MCG/SPRAY Soln Place 1 spray into both nostrils 2 (two) times daily.   Breztri Aerosphere 160-9-4.8 MCG/ACT Aero Generic drug: Budeson-Glycopyrrol-Formoterol Inhale 2 puffs into the lungs in the morning and at bedtime. 2 inhalations 1-2 times per day   Calcium Carbonate-Vitamin D 600-200 MG-UNIT Tabs Take 1 tablet by mouth daily.   donepezil 10 MG tablet Commonly known as: ARICEPT TAKE ONE TABLET BY MOUTH EVERYDAY AT BEDTIME   famotidine 20 MG tablet Commonly known as: Pepcid Take 1 tablet (20 mg total) by mouth at bedtime.   fluconazole 150 MG tablet Commonly known as: Diflucan Take 1 tablet (150 mg total) by mouth every 14 (fourteen) days.   fluticasone 50 MCG/ACT nasal spray Commonly known as: FLONASE Place 2 sprays into both nostrils daily. USE ONE SPRAY IN EACH NOSTRIL ONCE DAILY   Lumigan 0.01 % Soln Generic drug: bimatoprost Place 1 drop into both eyes at bedtime.   omeprazole 40 MG capsule Commonly known as: PRILOSEC Take 2 capsules (80 mg total) by mouth 2 (two) times daily.   PRESERVISION AREDS 2+MULTI VIT PO Take 1 capsule by mouth in the morning and at bedtime.   rosuvastatin 5 MG tablet Commonly known as: CRESTOR TAKE ONE TABLET BY MOUTH ONCE DAILY   SIMPLY SALINE NA Place 1 spray into the nose 2 (two) times daily.  SYSTANE OP Place 1 drop into both eyes as needed (dry eyes).   Vitamin D 50 MCG (2000 UT) tablet Take 2,000 Units by mouth daily.    Past Medical History:  Diagnosis Date   Allergic rhinoconjunctivitis 08/02/2015   Allergies    Arthritis    hands   Asthma    Dr Laurette Schimke   Asthma, well controlled 10/31/2017   Blepharitis    Coronary artery disease 06/21/2021   GERD (gastroesophageal reflux disease)    Glaucoma    Hereditary and idiopathic peripheral neuropathy 02/16/2016   Hx of fracture    Left wrist 2018; right patella (separate incident) 2018; used braces   Left wrist injury  01/18/2017   LPRD (laryngopharyngeal reflux disease) 08/02/2015   Macular degeneration    Memory loss    in study at [place], [place] 02/2020   Migraine    Moderate persistent asthma 08/02/2015   Osteopenia    Sensorimotor neuropathy    moderate-severe with chronic denervation- Dr Cyndi Lennert, oral 10/31/2017   Vertigo    occasional positional    Past Surgical History:  Procedure Laterality Date   BUNIONECTOMY Right    Right great toe and little toe, pins in both, [place], [place]   CATARACT EXTRACTION, BILATERAL     with implants; R eye 08/2017 and L eye 09/2017   Cervical decompression and fusion  1997   COLONOSCOPY     2003, 2008, 2013   ECTOPIC PREGNANCY SURGERY     injection laryngeal plasty 2008     per notes from [facility]   KNEE SURGERY Left    meniscus @ [employer], [place], [place]   nissan infundibulm     SALPINGECTOMY Right    [place], [place]   SHOULDER ARTHROSCOPY WITH OPEN ROTATOR CUFF REPAIR AND DISTAL CLAVICLE ACROMINECTOMY Left 03/13/2022   Procedure: LEFT SHOULDER ARTHROSCOPY, WITH MINI OPEN ROTATOR CUFF TEAR REPAIR & BICEPS TENODESIS;  Surgeon: Cammy Copa, MD;  Location: [facility] OR;  Service: Orthopedics;  Laterality: Left;   THROAT SURGERY     vocal cords injected with liquid bone  2005   [facility], [place]    Review of systems negative except as noted in HPI / PMHx or noted below:  Review of Systems  Constitutional: Negative.   HENT: Negative.    Eyes: Negative.   Respiratory: Negative.    Cardiovascular: Negative.   Gastrointestinal: Negative.   Genitourinary: Negative.   Musculoskeletal: Negative.   Skin: Negative.   Neurological: Negative.   Endo/Heme/Allergies: Negative.   Psychiatric/Behavioral: Negative.       Objective:   Vitals:   02/12/23 1100  BP: 104/60  Pulse: (!) 58  Resp: 18  Temp: 98 F (36.7 C)  SpO2: 99%   Height: 5' 7.25" (170.8 cm)  Weight: 117 lb 12.8 oz (53.4 kg)   Physical  Exam Constitutional:      Appearance: She is not diaphoretic.  HENT:     Head: Normocephalic.     Right Ear: Tympanic membrane, ear canal and external ear normal.     Left Ear: Tympanic membrane, ear canal and external ear normal.     Nose: Nose normal. No mucosal edema or rhinorrhea.     Mouth/Throat:     Pharynx: Uvula midline. No oropharyngeal exudate.  Eyes:     Conjunctiva/sclera: Conjunctivae normal.  Neck:     Thyroid: No thyromegaly.     Trachea: Trachea normal. No tracheal tenderness or tracheal deviation.  Cardiovascular:  Rate and Rhythm: Normal rate and regular rhythm.     Heart sounds: Normal heart sounds, S1 normal and S2 normal. No murmur heard. Pulmonary:     Effort: No respiratory distress.     Breath sounds: Normal breath sounds. No stridor. No wheezing or rales.  Lymphadenopathy:     Head:     Right side of head: No tonsillar adenopathy.     Left side of head: No tonsillar adenopathy.     Cervical: No cervical adenopathy.  Skin:    Findings: No erythema or rash.     Nails: There is no clubbing.  Neurological:     Mental Status: She is alert.     Diagnostics:    Spirometry was performed and demonstrated an FEV1 of 1.88 at 91 % of predicted.  Assessment and Plan:   1. Asthma, moderate persistent, well-controlled   2. Other allergic rhinitis   3. LPRD (laryngopharyngeal reflux disease)   4. Thrush, oral      1. Continue Breztri - 2 inhalations 1-2 times per day   2. Continue omeprazole 80 mg in AM + 80 mg in PM + famotidine 20 mg in evening  3. Continue nasal fluticasone 1-2 spray each nostril 1 time per day  4. Continue nystatin oral solution 1 teaspoon swish and swallow every morning + Diflucan 150 mg tablet every other week  5. If needed:  A. OTC Mucinex DM B. Azelastine C. ProAir HFA  D. nasal ipratropium 0.06% - 2 sprays each nostril every 6 hours    7. Return to clinic in 6 months or earlier if problem  8. Plan for fall flu  vaccine  Leaner appears to be doing very well on her current plan which includes Breztri just 1 time per day which is resulting in good control of her asthma and she has definitely had improvement regarding her throat issue while decreasing her Breztri from twice a day to once a day.  Her reflux is under good control with her plan.  Her nose is doing well as his her chronic thrush on her current plan.  Assuming she does well with this plan we will see her back in this clinic in 6 months or earlier if there is a problem.     Laurette Schimke, MD Allergy / Immunology [facility]

## 2023-02-12 NOTE — Patient Instructions (Signed)
   1. Continue Breztri - 2 inhalations 1-2 times per day   2. Continue omeprazole 80 mg in AM + 80 mg in PM + famotidine 20 mg in evening  3. Continue nasal fluticasone 1-2 spray each nostril 1 time per day  4. Continue nystatin oral solution 1 teaspoon swish and swallow every morning + Diflucan 150 mg tablet every other week  5. If needed:  A. OTC Mucinex DM B. Azelastine C. ProAir HFA  D. nasal ipratropium 0.06% - 2 sprays each nostril every 6 hours    7. Return to clinic in 6 months or earlier if problem  8. Plan for fall flu vaccine

## 2023-02-13 ENCOUNTER — Encounter: Payer: Self-pay | Admitting: Allergy and Immunology

## 2023-03-07 ENCOUNTER — Ambulatory Visit (INDEPENDENT_AMBULATORY_CARE_PROVIDER_SITE_OTHER): Payer: PPO

## 2023-03-07 ENCOUNTER — Ambulatory Visit
Admission: EM | Admit: 2023-03-07 | Discharge: 2023-03-07 | Disposition: A | Discharge status: Home / Self Care | Payer: PPO | Attending: Internal Medicine | Admitting: Internal Medicine

## 2023-03-07 DIAGNOSIS — S82044A Nondisplaced comminuted fracture of right patella, initial encounter for closed fracture: Secondary | ICD-10-CM

## 2023-03-07 DIAGNOSIS — M25461 Effusion, right knee: Secondary | ICD-10-CM | POA: Diagnosis not present

## 2023-03-07 NOTE — ED Triage Notes (Signed)
Pt states that she fell and injured her right knee. X1 day

## 2023-03-07 NOTE — ED Provider Notes (Signed)
[facility]    CSN: [id number] Arrival date & time: 03/07/23  1103      History   Chief Complaint Chief Complaint  Patient presents with   Knee Injury    Right knee injury x1 day    HPI Victoria Trevino is a 82 y.o. female.   Patient presents with right knee pain after a fall that occurred about 45 minutes prior to arrival to urgent care.  Patient reports that she stood up from her chair and her foot slipped on some magazines that were in front of her.  She states that she fell down landing directly on her right knee.  Denies any other pain or any injury to the head.  Denies loss of consciousness.  She does not take any blood thinning medications.  She has not yet taken anything for pain.  Reports difficulty bending the knee.  Denies numbness or tingling.     Past Medical History:  Diagnosis Date   Allergic rhinoconjunctivitis 08/02/2015   Allergies    Arthritis    hands   Asthma    Dr Laurette Schimke   Asthma, well controlled 10/31/2017   Blepharitis    Coronary artery disease 06/21/2021   GERD (gastroesophageal reflux disease)    Glaucoma    Hereditary and idiopathic peripheral neuropathy 02/16/2016   Hx of fracture    Left wrist 2018; right patella (separate incident) 2018; used braces   Left wrist injury 01/18/2017   LPRD (laryngopharyngeal reflux disease) 08/02/2015   Macular degeneration    Memory loss    in study at [place], [place] 02/2020   Migraine    Moderate persistent asthma 08/02/2015   Osteopenia    Sensorimotor neuropathy    moderate-severe with chronic denervation- Dr Cyndi Lennert, oral 10/31/2017   Vertigo    occasional positional    Patient Active Problem List   Diagnosis Date Noted   Complete tear of left rotator cuff    Biceps tendonitis on left    Synovitis of left shoulder    Age-related memory disorder 11/27/2021   Acute cough 08/04/2021   Not well controlled mild persistent asthma 08/04/2021   Other allergic rhinitis  08/04/2021   MCI (mild cognitive impairment) 01/12/2021   Asthma, well controlled 10/31/2017   Thrush, oral 10/31/2017   Left wrist injury 01/18/2017   Hereditary and idiopathic peripheral neuropathy 02/16/2016   LPRD (laryngopharyngeal reflux disease) 08/02/2015   Allergic rhinoconjunctivitis 08/02/2015   Moderate persistent asthma 08/02/2015    Past Surgical History:  Procedure Laterality Date   BUNIONECTOMY Right    Right great toe and little toe, pins in both, [place], [place]   CATARACT EXTRACTION, BILATERAL     with implants; R eye 08/2017 and L eye 09/2017   Cervical decompression and fusion  1997   COLONOSCOPY     2003, 2008, 2013   ECTOPIC PREGNANCY SURGERY     injection laryngeal plasty 2008     per notes from [facility]   KNEE SURGERY Left    meniscus @ [employer], [place], [place]   nissan infundibulm     SALPINGECTOMY Right    [place], [place]   SHOULDER ARTHROSCOPY WITH OPEN ROTATOR CUFF REPAIR AND DISTAL CLAVICLE ACROMINECTOMY Left 03/13/2022   Procedure: LEFT SHOULDER ARTHROSCOPY, WITH MINI OPEN ROTATOR CUFF TEAR REPAIR & BICEPS TENODESIS;  Surgeon: Cammy Copa, MD;  Location: [facility] OR;  Service: Orthopedics;  Laterality: Left;   THROAT SURGERY  vocal cords injected with liquid bone  [address] [place], [place]    OB History   No obstetric history on file.      Home Medications    Prior to Admission medications   Medication Sig Start Date End Date Taking? Authorizing Provider  acetaminophen (TYLENOL) 500 MG tablet Take 500 mg by mouth in the morning and at bedtime.   Yes [provider]  albuterol (PROVENTIL HFA) 108 (90 Base) MCG/ACT inhaler Inhale 2 puffs into the lungs every 6 (six) hours as needed for wheezing or shortness of breath. 02/12/23  Yes Kozlow, Alvira Philips, MD  aspirin EC 81 MG tablet Take 81 mg by mouth daily. Swallow whole.   Yes [provider]  Azelastine HCl 137 MCG/SPRAY SOLN Place 1 spray into both  nostrils 2 (two) times daily. 10/02/22  Yes Kozlow, Alvira Philips, MD  bimatoprost (LUMIGAN) 0.01 % SOLN Place 1 drop into both eyes at bedtime.   Yes [provider]  Budeson-Glycopyrrol-Formoterol (BREZTRI AEROSPHERE) 160-9-4.8 MCG/ACT AERO 2 puffs 1-2 times per day. 02/12/23  Yes Kozlow, Alvira Philips, MD  Calcium Carbonate-Vitamin D 600-200 MG-UNIT TABS Take 1 tablet by mouth daily.   Yes [provider]  Cholecalciferol (VITAMIN D) 50 MCG (2000 UT) tablet Take 2,000 Units by mouth daily.   Yes [provider]  donepezil (ARICEPT) 10 MG tablet TAKE ONE TABLET BY MOUTH EVERYDAY AT BEDTIME 03/12/22  Yes Anson Fret, MD  famotidine (PEPCID) 20 MG tablet Take 1 tablet (20 mg total) by mouth at bedtime. 10/02/22  Yes Kozlow, Alvira Philips, MD  fluconazole (DIFLUCAN) 150 MG tablet Take 1 tablet (150 mg total) by mouth every 14 (fourteen) days. 10/02/22  Yes Kozlow, Alvira Philips, MD  fluconazole (DIFLUCAN) 150 MG tablet 1 tablet every other week 02/12/23  Yes Kozlow, Alvira Philips, MD  fluticasone (FLONASE) 50 MCG/ACT nasal spray Place 1-2 sprays into both nostrils daily. USE ONE SPRAY IN EACH NOSTRIL ONCE DAILY 02/12/23  Yes Kozlow, Alvira Philips, MD  ipratropium (ATROVENT) 0.06 % nasal spray 2 sprays each nostril every 6 hours if needed. 02/12/23  Yes Kozlow, Alvira Philips, MD  Multiple Vitamins-Minerals (PRESERVISION AREDS 2+MULTI VIT PO) Take 1 capsule by mouth in the morning and at bedtime.   Yes [provider]  nystatin (MYCOSTATIN) 100000 UNIT/ML suspension Take 1 teaspoon swish and swallow every morning. 02/12/23  Yes Kozlow, Alvira Philips, MD  omeprazole (PRILOSEC) 40 MG capsule Take 2 capsules (80 mg total) by mouth 2 (two) times daily. 02/12/23  Yes Kozlow, Alvira Philips, MD  Polyethyl Glycol-Propyl Glycol (SYSTANE OP) Place 1 drop into both eyes as needed (dry eyes).   Yes [provider]  rosuvastatin (CRESTOR) 5 MG tablet TAKE ONE TABLET BY MOUTH ONCE DAILY 11/08/22  Yes Jake Bathe, MD  SIMPLY SALINE NA  Place 1 spray into the nose 2 (two) times daily.   Yes [provider]    Family History Family History  Problem Relation Age of Onset   Alzheimer's disease Mother        Deceased, 49   Hypercholesterolemia Mother    Heart attack Father    Congestive Heart Failure Father        Deceased, 62   Emphysema Father        smoker   CAD Father    Benign prostatic hyperplasia Father    Skin cancer Sister    Allergic rhinitis Sister    Hypertension Sister  Hypercholesterolemia Sister    Obesity Sister    Irritable bowel syndrome Sister    Aneurysm Brother    Skin cancer Brother        basal cell   Memory loss Brother    COPD Brother    Congestive Heart Failure Brother    Emphysema Brother    Stroke Maternal Grandmother    Dementia Maternal Grandmother    Prostate cancer Maternal Grandfather    Dementia Maternal Grandfather    Diabetes Son    Hypertension Son    Diabetes Daughter    Asthma Daughter    Stroke Paternal Grandmother    CAD Paternal Grandfather     Social History Social History   Tobacco Use   Smoking status: Never   Smokeless tobacco: Never  Vaping Use   Vaping Use: Never used  Substance Use Topics   Alcohol use: Not Currently    Comment: was a glass of wine one time per year   Drug use: Never     Allergies   Valium [diazepam]   Review of Systems Review of Systems Per HPI  Physical Exam Triage Vital Signs ED Triage Vitals  Enc Vitals Group     BP 03/07/23 1124 115/60     Pulse Rate 03/07/23 1124 (!) 59     Resp 03/07/23 1124 17     Temp 03/07/23 1124 97.8 F (36.6 C)     Temp Source 03/07/23 1124 Oral     SpO2 03/07/23 1124 97 %     Weight 03/07/23 1121 114 lb (51.7 kg)     Height 03/07/23 1121 5\' 7"  (1.702 m)     Head Circumference --      Peak Flow --      Pain Score 03/07/23 1121 4     Pain Loc --      Pain Edu? --      Excl. in GC? --    No data found.  Updated Vital Signs BP 115/60 (BP Location: Right Arm)    Pulse (!) 59   Temp 97.8 F (36.6 C) (Oral)   Resp 17   Ht 5\' 7"  (1.702 m)   Wt 114 lb (51.7 kg)   SpO2 97%   BMI 17.85 kg/m   Visual Acuity Right Eye Distance:   Left Eye Distance:   Bilateral Distance:    Right Eye Near:   Left Eye Near:    Bilateral Near:     Physical Exam Constitutional:      General: She is not in acute distress.    Appearance: Normal appearance. She is not toxic-appearing or diaphoretic.  HENT:     Head: Normocephalic and atraumatic.  Eyes:     Extraocular Movements: Extraocular movements intact.     Conjunctiva/sclera: Conjunctivae normal.  Pulmonary:     Effort: Pulmonary effort is normal.  Musculoskeletal:     Comments: Patient has tenderness to palpation throughout the patella especially to the lower patellar region.  No obvious crepitus noted.  Patient is not able to flex knee for examination due to pain.  Mild bruising noted to the left lower area but no abrasions or lacerations.  Patient appears neurovascularly intact.  Neurological:     General: No focal deficit present.     Mental Status: She is alert and oriented to person, place, and time. Mental status is at baseline.  Psychiatric:        Mood and Affect: Mood normal.  Behavior: Behavior normal.        Thought Content: Thought content normal.        Judgment: Judgment normal.      UC Treatments / Results  Labs (all labs ordered are listed, but only abnormal results are displayed) Labs Reviewed - No data to display  EKG   Radiology DG Knee AP/LAT W/Sunrise Right  Result Date: 03/07/2023 CLINICAL DATA:  Right knee pain after fall EXAM: RIGHT KNEE 3 VIEWS COMPARISON:  Right knee radiographs March 05, 2019 FINDINGS: There is a comminuted nondisplaced fracture of the patella. No additional acute fracture or dislocation visualized. Small joint effusion. Chondrocalcinosis. The joint spaces are otherwise well-maintained. Mild diffuse soft tissue swelling. IMPRESSION: Comminuted  nondisplaced fracture of the patella. Electronically Signed   By: Jacob Moores M.D.   On: 03/07/2023 12:06    Procedures Procedures (including critical care time)  Medications Ordered in UC Medications - No data to display  Initial Impression / Assessment and Plan / UC Course  I have reviewed the triage vital signs and the nursing notes.  Pertinent labs & imaging results that were available during my care of the patient were reviewed by me and considered in my medical decision making (see chart for details).     She has a comminuted nondisplaced fracture of the right patella.  Spoke with on-call orthopedist who advised knee immobilizer, weightbearing as tolerated, and walking with a walker.  He also advised having her follow-up with them next week for further evaluation and management.  Knee immobilizer placed by clinical staff.  Advised patient to keep knee straight.  Patient already has walker here with her in urgent care so encouraged her to use this as well.  Advised ice application and supportive care.  Patient offered pain medication but she declined.  Provided patient with contact information for [facility].  Patient verbalized understanding and was agreeable with plan. Final Clinical Impressions(s) / UC Diagnoses   Final diagnoses:  Closed nondisplaced comminuted fracture of right patella, initial encounter     Discharge Instructions      You have a fracture of your kneecap.  Knee immobilizer has been placed.  You may bear weight as tolerated.  Walk with a walker.  Apply ice.  Follow-up with orthopedist as soon as possible to schedule an appointment for next week.     ED Prescriptions   None    I have reviewed the PDMP during this encounter.   Gustavus Bryant, [place] 03/07/23 1300

## 2023-03-07 NOTE — Discharge Instructions (Addendum)
You have a fracture of your kneecap.  Knee immobilizer has been placed.  You may bear weight as tolerated.  Walk with a walker.  Apply ice.  Follow-up with orthopedist as soon as possible to schedule an appointment for next week.

## 2023-03-11 ENCOUNTER — Other Ambulatory Visit: Payer: Self-pay | Admitting: Family Medicine

## 2023-03-11 ENCOUNTER — Ambulatory Visit
Admission: RE | Admit: 2023-03-11 | Discharge: 2023-03-11 | Disposition: A | Discharge status: Home / Self Care | Payer: PPO | Source: Ambulatory Visit | Attending: Family Medicine | Admitting: Family Medicine

## 2023-03-11 DIAGNOSIS — M25561 Pain in right knee: Secondary | ICD-10-CM

## 2023-03-11 DIAGNOSIS — M25061 Hemarthrosis, right knee: Secondary | ICD-10-CM | POA: Diagnosis not present

## 2023-03-11 DIAGNOSIS — S82041A Displaced comminuted fracture of right patella, initial encounter for closed fracture: Secondary | ICD-10-CM | POA: Diagnosis not present

## 2023-03-11 DIAGNOSIS — M1711 Unilateral primary osteoarthritis, right knee: Secondary | ICD-10-CM | POA: Diagnosis not present

## 2023-03-27 DIAGNOSIS — M25561 Pain in right knee: Secondary | ICD-10-CM | POA: Diagnosis not present

## 2023-04-10 DIAGNOSIS — M25561 Pain in right knee: Secondary | ICD-10-CM | POA: Diagnosis not present

## 2023-04-15 ENCOUNTER — Other Ambulatory Visit: Payer: Self-pay | Admitting: Allergy and Immunology

## 2023-04-25 DIAGNOSIS — J029 Acute pharyngitis, unspecified: Secondary | ICD-10-CM | POA: Diagnosis not present

## 2023-05-01 ENCOUNTER — Telehealth: Payer: Self-pay | Admitting: Allergy and Immunology

## 2023-05-01 NOTE — Telephone Encounter (Signed)
Patient called to inform that her pharmacy is closing down and her new one is [facility] [place]. If any questions or concerns patient would like a call back @ [phone].

## 2023-05-01 NOTE — Telephone Encounter (Signed)
Spoke with patient to see if she needed any of her medications sent to her new pharmacy. Patient stated she didn't as her old pharmacy refilled them already. Patient stated she will contact her new pharmacy [facility] when she is needing refills.

## 2023-05-13 DIAGNOSIS — M25561 Pain in right knee: Secondary | ICD-10-CM | POA: Diagnosis not present

## 2023-05-17 ENCOUNTER — Other Ambulatory Visit: Payer: Self-pay | Admitting: Internal Medicine

## 2023-05-17 DIAGNOSIS — Z1231 Encounter for screening mammogram for malignant neoplasm of breast: Secondary | ICD-10-CM

## 2023-06-11 ENCOUNTER — Ambulatory Visit
Admission: RE | Admit: 2023-06-11 | Discharge: 2023-06-11 | Disposition: A | Discharge status: Home / Self Care | Payer: PPO | Source: Ambulatory Visit | Attending: Internal Medicine | Admitting: Internal Medicine

## 2023-06-11 DIAGNOSIS — Z1231 Encounter for screening mammogram for malignant neoplasm of breast: Secondary | ICD-10-CM | POA: Diagnosis not present

## 2023-06-12 DIAGNOSIS — H401213 Low-tension glaucoma, right eye, severe stage: Secondary | ICD-10-CM | POA: Diagnosis not present

## 2023-06-17 ENCOUNTER — Telehealth: Payer: Self-pay | Admitting: Neurology

## 2023-06-17 DIAGNOSIS — E441 Mild protein-calorie malnutrition: Secondary | ICD-10-CM | POA: Diagnosis not present

## 2023-06-17 DIAGNOSIS — E7800 Pure hypercholesterolemia, unspecified: Secondary | ICD-10-CM | POA: Diagnosis not present

## 2023-06-17 DIAGNOSIS — J4530 Mild persistent asthma, uncomplicated: Secondary | ICD-10-CM | POA: Diagnosis not present

## 2023-06-17 DIAGNOSIS — Z9181 History of falling: Secondary | ICD-10-CM | POA: Diagnosis not present

## 2023-06-17 DIAGNOSIS — Z79899 Other long term (current) drug therapy: Secondary | ICD-10-CM | POA: Diagnosis not present

## 2023-06-17 DIAGNOSIS — K9089 Other intestinal malabsorption: Secondary | ICD-10-CM | POA: Diagnosis not present

## 2023-06-17 DIAGNOSIS — G3184 Mild cognitive impairment, so stated: Secondary | ICD-10-CM | POA: Diagnosis not present

## 2023-06-17 DIAGNOSIS — G629 Polyneuropathy, unspecified: Secondary | ICD-10-CM | POA: Diagnosis not present

## 2023-06-17 DIAGNOSIS — Z0000 Encounter for general adult medical examination without abnormal findings: Secondary | ICD-10-CM | POA: Diagnosis not present

## 2023-06-17 DIAGNOSIS — D692 Other nonthrombocytopenic purpura: Secondary | ICD-10-CM | POA: Diagnosis not present

## 2023-06-17 DIAGNOSIS — Z1331 Encounter for screening for depression: Secondary | ICD-10-CM | POA: Diagnosis not present

## 2023-06-17 DIAGNOSIS — G250 Essential tremor: Secondary | ICD-10-CM | POA: Diagnosis not present

## 2023-06-17 MED ORDER — DONEPEZIL HCL 10 MG PO TABS: 10.0000 mg | ORAL_TABLET | Freq: Every day | ORAL | 0 refills | Status: DC

## 2023-06-17 NOTE — Telephone Encounter (Signed)
Pt request refill for donepezil (ARICEPT) 10 MG tablet. Send to  [facility]

## 2023-06-17 NOTE — Telephone Encounter (Addendum)
I spoke with [facility].  They have not filled this for the patient before.  [facility]  I called the patient and she states she consistently takes donepezil 10 mg daily and she has been filling [facility]. [facility] is [facility]. I sent a refill to [facility]. Next appt here at on 11/28/23.

## 2023-07-16 ENCOUNTER — Other Ambulatory Visit: Payer: Self-pay | Admitting: Allergy and Immunology

## 2023-08-13 ENCOUNTER — Ambulatory Visit: Payer: PPO | Admitting: Allergy and Immunology

## 2023-08-13 VITALS — BP 102/62 | HR 61 | Temp 98.2°F | Resp 14 | Ht 66.93 in | Wt 117.8 lb

## 2023-08-13 DIAGNOSIS — J4540 Moderate persistent asthma, uncomplicated: Secondary | ICD-10-CM | POA: Diagnosis not present

## 2023-08-13 DIAGNOSIS — B370 Candidal stomatitis: Secondary | ICD-10-CM | POA: Diagnosis not present

## 2023-08-13 DIAGNOSIS — K219 Gastro-esophageal reflux disease without esophagitis: Secondary | ICD-10-CM

## 2023-08-13 DIAGNOSIS — R0609 Other forms of dyspnea: Secondary | ICD-10-CM

## 2023-08-13 DIAGNOSIS — J3089 Other allergic rhinitis: Secondary | ICD-10-CM

## 2023-08-13 MED ORDER — FAMOTIDINE 20 MG PO TABS: 20.0000 mg | ORAL_TABLET | Freq: Every evening | ORAL | 1 refills | Status: DC

## 2023-08-13 MED ORDER — FLUTICASONE PROPIONATE 50 MCG/ACT NA SUSP: 1.0000 | Freq: Every day | NASAL | 1 refills | Status: DC

## 2023-08-13 MED ORDER — BREZTRI AEROSPHERE 160-9-4.8 MCG/ACT IN AERO: INHALATION_SPRAY | RESPIRATORY_TRACT | 5 refills | Status: DC

## 2023-08-13 MED ORDER — OMEPRAZOLE 40 MG PO CPDR: 80.0000 mg | DELAYED_RELEASE_CAPSULE | Freq: Two times a day (BID) | ORAL | 1 refills | Status: DC

## 2023-08-13 NOTE — Progress Notes (Unsigned)
[place] - [place] - [place] - [place] - [place]   Follow-up Note  Referring Provider: Thana Ates, MD Primary Provider: Thana Ates, MD Date of Office Visit: 08/13/2023  Subjective:   Victoria Trevino (DOB: May 22, 1941) is a 82 y.o. female who [facility] in re-evaluation of the following:  HPI: Daleiza presents to this clinic in reevaluation of asthma, allergic rhinitis, reflux, recurrent thrush, and Breztri induced laryngitis.  I last saw her in this clinic 12 February 2023.  Although she has not really developed much coughing or wheezing while using her Breztri once a day, with this dose being limited by the fact that she cannot use it twice a day without development of laryngitis, she has developed significant dyspnea on exertion.  She cannot really walk very much at all without getting very short of breath.  She was walking 1/4 mile with no problem and now she can not even go to 100 feet at this point.  Her reflux is also been somewhat active.  She has had lots of "churning" in her abdomen and some reflux while using her current therapy directed against her reflux which includes a very aggressive treatment including high-dose proton pump inhibitor.  She thinks that her thrush is under good control on her current therapy which includes Diflucan every other week and nystatin.  She has had very little problems with her nose.  She did obtain this years flu vaccine.  She is here today with her son and her son does relate this history of her arising from a sitting position and getting a little bit dizzy and almost falling over on 1 occasion.  Allergies as of 08/13/2023       Reactions   Atorvastatin Other (See Comments)   Dizziness   Diazepam Other (See Comments)   Hypersensitivity - over sedated        Medication List    acetaminophen 500 MG tablet Commonly known as: TYLENOL Take 500 mg by mouth in the morning and at  bedtime.   albuterol 108 (90 Base) MCG/ACT inhaler Commonly known as: Proventil HFA Inhale 2 puffs into the lungs every 6 (six) hours as needed for wheezing or shortness of breath.   antiseptic oral rinse Liqd 15 mLs by Mouth Rinse route as needed.   aspirin EC 81 MG tablet Take 81 mg by mouth daily. Swallow whole.   Azelastine HCl 137 MCG/SPRAY Soln Place 1 spray into both nostrils 2 (two) times daily.   Breztri Aerosphere 160-9-4.8 MCG/ACT Aero Generic drug: Budeson-Glycopyrrol-Formoterol 2 puffs 1-2 times per day.   Calcium Carbonate-Vitamin D 600-200 MG-UNIT Tabs Take 1 tablet by mouth daily.   donepezil 10 MG tablet Commonly known as: ARICEPT Take 1 tablet (10 mg total) by mouth at bedtime.   famotidine 20 MG tablet Commonly known as: PEPCID TAKE ONE TABLET BY MOUTH EVERYDAY AT BEDTIME   fluconazole 150 MG tablet Commonly known as: Diflucan Take 1 tablet (150 mg total) by mouth every 14 (fourteen) days.   fluticasone 50 MCG/ACT nasal spray Commonly known as: FLONASE Place 1-2 sprays into both nostrils daily. USE ONE SPRAY IN EACH NOSTRIL ONCE DAILY   Fluzone High-Dose 0.5 ML injection Generic drug: Influenza vac split quadrivalent PF Inject 0.5 mLs into the muscle once.   ipratropium 0.06 % nasal spray Commonly known as: ATROVENT 2 sprays each nostril every 6 hours if needed.   Lumigan 0.01 % Soln Generic drug: bimatoprost Place  1 drop into both eyes at bedtime.   nystatin 100000 UNIT/ML suspension Commonly known as: MYCOSTATIN SWISH AND SWALLOW 5 MLS BY MOUTH EVERY MORNING   omeprazole 40 MG capsule Commonly known as: PRILOSEC Take 2 capsules (80 mg total) by mouth 2 (two) times daily.   PRESERVISION AREDS 2+MULTI VIT PO Take 1 capsule by mouth in the morning and at bedtime.   rosuvastatin 5 MG tablet Commonly known as: CRESTOR TAKE ONE TABLET BY MOUTH ONCE DAILY   SIMPLY SALINE NA Place 1 spray into the nose 2 (two) times daily.   Spikevax  syringe Generic drug: COVID-19 mRNA vaccine Inject 0.5 mLs into the muscle once.   SYSTANE OP Place 1 drop into both eyes as needed (dry eyes).   Vitamin D 50 MCG (2000 UT) tablet Take 2,000 Units by mouth daily.    Past Medical History:  Diagnosis Date   Allergic rhinoconjunctivitis 08/02/2015   Allergies    Arthritis    hands   Asthma    Dr Laurette Schimke   Asthma, well controlled 10/31/2017   Blepharitis    Coronary artery disease 06/21/2021   GERD (gastroesophageal reflux disease)    Glaucoma    Hereditary and idiopathic peripheral neuropathy 02/16/2016   Hx of fracture    Left wrist 2018; right patella (separate incident) 2018; used braces   Left wrist injury 01/18/2017   LPRD (laryngopharyngeal reflux disease) 08/02/2015   Macular degeneration    Memory loss    in study at [place], [place] 02/2020   Migraine    Moderate persistent asthma 08/02/2015   Osteopenia    Sensorimotor neuropathy    moderate-severe with chronic denervation- Dr Cyndi Lennert, oral 10/31/2017   Vertigo    occasional positional    Past Surgical History:  Procedure Laterality Date   BUNIONECTOMY Right    Right great toe and little toe, pins in both, [place], [place]   CATARACT EXTRACTION, BILATERAL     with implants; R eye 08/2017 and L eye 09/2017   Cervical decompression and fusion  1997   COLONOSCOPY     2003, 2008, 2013   ECTOPIC PREGNANCY SURGERY     injection laryngeal plasty 2008     per notes from [facility]   KNEE SURGERY Left    meniscus @ [employer], [place], [place]   nissan infundibulm     SALPINGECTOMY Right    [place], [place]   SHOULDER ARTHROSCOPY WITH OPEN ROTATOR CUFF REPAIR AND DISTAL CLAVICLE ACROMINECTOMY Left 03/13/2022   Procedure: LEFT SHOULDER ARTHROSCOPY, WITH MINI OPEN ROTATOR CUFF TEAR REPAIR & BICEPS TENODESIS;  Surgeon: Cammy Copa, MD;  Location: [facility] OR;  Service: Orthopedics;  Laterality: Left;   THROAT SURGERY     vocal cords  injected with liquid bone  2005   [facility], [place]    Review of systems negative except as noted in HPI / PMHx or noted below:  Review of Systems  Constitutional: Negative.   HENT: Negative.    Eyes: Negative.   Respiratory: Negative.    Cardiovascular: Negative.   Gastrointestinal: Negative.   Genitourinary: Negative.   Musculoskeletal: Negative.   Skin: Negative.   Neurological: Negative.   Endo/Heme/Allergies: Negative.   Psychiatric/Behavioral: Negative.       Objective:   Vitals:   08/13/23 1041  BP: 102/62  Pulse: 61  Resp: 14  Temp: 98.2 F (36.8 C)  SpO2: 98%   Height: 5' 6.93" (170 cm)  Weight: 117 lb  12.8 oz (53.4 kg)   Physical Exam Constitutional:      Appearance: She is not diaphoretic.  HENT:     Head: Normocephalic.     Right Ear: Tympanic membrane, ear canal and external ear normal.     Left Ear: Tympanic membrane, ear canal and external ear normal.     Nose: Nose normal. No mucosal edema or rhinorrhea.     Mouth/Throat:     Pharynx: Uvula midline. No oropharyngeal exudate.  Eyes:     Conjunctiva/sclera: Conjunctivae normal.  Neck:     Thyroid: No thyromegaly.     Trachea: Trachea normal. No tracheal tenderness or tracheal deviation.  Cardiovascular:     Rate and Rhythm: Normal rate and regular rhythm.     Heart sounds: Normal heart sounds, S1 normal and S2 normal. No murmur heard. Pulmonary:     Effort: No respiratory distress.     Breath sounds: Normal breath sounds. No stridor. No wheezing or rales.  Lymphadenopathy:     Head:     Right side of head: No tonsillar adenopathy.     Left side of head: No tonsillar adenopathy.     Cervical: No cervical adenopathy.  Skin:    Findings: No erythema or rash.     Nails: There is no clubbing.  Neurological:     Mental Status: She is alert.     Diagnostics: Spirometry was performed and demonstrated an FEV1 of 1.83 at 92 % of predicted.  Oxygen saturation on room air at rest was  98%.  Oxygen saturation on room air while walking the hallway was 97%.   Assessment and Plan:   1. Asthma, moderate persistent, well-controlled   2. Other allergic rhinitis   3. LPRD (laryngopharyngeal reflux disease)   4. Thrush, oral   5. DOE (dyspnea on exertion)     1. Continue Breztri - 2 inhalations 1-2 times per day   2. Continue omeprazole 80 mg in AM + 80 mg in PM + famotidine 20 mg in evening  3. Continue nasal fluticasone 1-2 spray each nostril 1 time per day  4. Continue nystatin oral solution 1 teaspoon swish and swallow every morning + Diflucan 150 mg tablet every other week  5. If needed:  A. OTC Mucinex DM B. Azelastine C. ProAir HFA  D. nasal ipratropium 0.06% - 2 sprays each nostril every 6 hours    6. Obtain 2D echo w/doppler for DOE  7. Obtain Chest X-ray  8. Visit with GI doctor about GI issue  9. Return to clinic in 4 weeks or earlier if problem  Indria has pretty significant dyspnea on exertion and we will obtain a echo and a chest x-ray and make a decision about how to proceed with further evaluation and treatment of this issue based upon the results of those studies.  And she is having significant GI issues as well going to refer her back to her GI doctor.  She will remain on anti-inflammatory agents for airway and therapy directed against both reflux and thrush as noted above.   Laurette Schimke, MD Allergy / Immunology [facility]

## 2023-08-13 NOTE — Patient Instructions (Addendum)
   1. Continue Breztri - 2 inhalations 1-2 times per day   2. Continue omeprazole 80 mg in AM + 80 mg in PM + famotidine 20 mg in evening  3. Continue nasal fluticasone 1-2 spray each nostril 1 time per day  4. Continue nystatin oral solution 1 teaspoon swish and swallow every morning + Diflucan 150 mg tablet every other week  5. If needed:  A. OTC Mucinex DM B. Azelastine C. ProAir HFA  D. nasal ipratropium 0.06% - 2 sprays each nostril every 6 hours    6. Obtain 2D echo w/doppler for DOE  7. Obtain Chest X-ray  8. Visit with GI doctor about GI issue  9. Return to clinic in 4 weeks or earlier if problem

## 2023-08-14 ENCOUNTER — Encounter: Payer: Self-pay | Admitting: Allergy and Immunology

## 2023-08-14 ENCOUNTER — Ambulatory Visit
Admission: RE | Admit: 2023-08-14 | Discharge: 2023-08-14 | Disposition: A | Discharge status: Home / Self Care | Payer: PPO | Source: Ambulatory Visit | Attending: Allergy and Immunology | Admitting: Allergy and Immunology

## 2023-08-14 ENCOUNTER — Encounter: Payer: Self-pay | Admitting: Gastroenterology

## 2023-08-14 DIAGNOSIS — J45909 Unspecified asthma, uncomplicated: Secondary | ICD-10-CM | POA: Diagnosis not present

## 2023-08-22 ENCOUNTER — Other Ambulatory Visit: Payer: Self-pay | Admitting: Allergy and Immunology

## 2023-09-17 ENCOUNTER — Ambulatory Visit: Payer: PPO | Admitting: Allergy and Immunology

## 2023-09-17 ENCOUNTER — Encounter: Payer: Self-pay | Admitting: Allergy and Immunology

## 2023-09-17 ENCOUNTER — Other Ambulatory Visit: Payer: Self-pay

## 2023-09-17 VITALS — BP 116/66 | HR 60 | Temp 97.8°F | Resp 18 | Ht 66.0 in | Wt 121.8 lb

## 2023-09-17 DIAGNOSIS — B370 Candidal stomatitis: Secondary | ICD-10-CM

## 2023-09-17 DIAGNOSIS — K219 Gastro-esophageal reflux disease without esophagitis: Secondary | ICD-10-CM

## 2023-09-17 DIAGNOSIS — J3089 Other allergic rhinitis: Secondary | ICD-10-CM | POA: Diagnosis not present

## 2023-09-17 DIAGNOSIS — R0609 Other forms of dyspnea: Secondary | ICD-10-CM

## 2023-09-17 DIAGNOSIS — J4540 Moderate persistent asthma, uncomplicated: Secondary | ICD-10-CM | POA: Diagnosis not present

## 2023-09-17 MED ORDER — AZELASTINE HCL 137 MCG/SPRAY NA SOLN: 2.0000 | Freq: Two times a day (BID) | NASAL | 1 refills | Status: DC

## 2023-09-17 MED ORDER — BREZTRI AEROSPHERE 160-9-4.8 MCG/ACT IN AERO: 2.0000 | INHALATION_SPRAY | Freq: Two times a day (BID) | RESPIRATORY_TRACT | 1 refills | Status: DC

## 2023-09-17 MED ORDER — FLUTICASONE PROPIONATE 50 MCG/ACT NA SUSP: 2.0000 | Freq: Every morning | NASAL | 1 refills | Status: DC

## 2023-09-17 MED ORDER — FAMOTIDINE 20 MG PO TABS: 20.0000 mg | ORAL_TABLET | Freq: Two times a day (BID) | ORAL | 1 refills | Status: DC

## 2023-09-17 MED ORDER — ALBUTEROL SULFATE HFA 108 (90 BASE) MCG/ACT IN AERS: 2.0000 | INHALATION_SPRAY | Freq: Four times a day (QID) | RESPIRATORY_TRACT | 1 refills | Status: DC | PRN

## 2023-09-17 MED ORDER — FLUCONAZOLE 150 MG PO TABS: 150.0000 mg | ORAL_TABLET | ORAL | 0 refills | Status: DC

## 2023-09-17 NOTE — Progress Notes (Signed)
 [place] - [place] - [place]   Follow-up Note  Referring Provider: Dwight Trula SQUIBB, MD Primary Provider: Dwight Trula SQUIBB, MD Date of Office Visit: 09/17/2023  Subjective:   Victoria Trevino (DOB: [birth year]) is a 83 y.o. female who [facility]23 in re-evaluation of the following:  HPI: Victoria Trevino returns to this clinic in evaluation of asthma, dyspnea on exertion, allergic rhinitis, reflux, recurrent thrush, and history of inhaled steroid induced laryngitis.  I last saw her in this clinic 14 August 2023.  During her last visit she was having very significant dyspnea on exertion.  We obtained a chest x-ray which was okay.  She underwent a progressive aerobic exercise program and is actually much better at this point in time.  She cannot walk much further than she did in the past and overall is very satisfied with the response that she has.  She will be seeing cardiology to have an echo completed in February.  She continues on her Breztri  just 1 time per day because of her history of inhaled steroid induced laryngitis.  She rarely uses a short acting bronchodilator.  She uses nystatin  on a daily basis to prevent thrush and she also takes a Diflucan  about every other week to prevent thrush.  She has had no problems with her nose.  She still has significant problems with regurgitation and burning and stomach upset.  This does appear to correlate with the fact that her insurance company has denied her 80 mg of omeprazole  twice a day and is only giving her 40 mg twice a day.  Allergies as of 09/17/2023       Reactions   Atorvastatin  Other (See Comments)   Dizziness   Diazepam  Other (See Comments)   Hypersensitivity - over sedated        Medication List    acetaminophen  500 MG tablet Commonly known as: TYLENOL  Take 500 mg by mouth in the morning and at bedtime.   albuterol  108 (90 Base) MCG/ACT inhaler Commonly known as:  Proventil  HFA Inhale 2 puffs into the lungs every 6 (six) hours as needed for wheezing or shortness of breath.   antiseptic oral rinse Liqd 15 mLs by Mouth Rinse route as needed.   aspirin  EC 81 MG tablet Take 81 mg by mouth daily. Swallow whole.   Azelastine  HCl 137 MCG/SPRAY Soln Place 1 spray into both nostrils 2 (two) times daily.   Breztri  Aerosphere 160-9-4.8 MCG/ACT Aero Generic drug: Budeson-Glycopyrrol-Formoterol  2 puffs 1-2 times per day.   Calcium  Carbonate-Vitamin D 600-200 MG-UNIT Tabs Take 1 tablet by mouth daily.   donepezil  10 MG tablet Commonly known as: ARICEPT  Take 1 tablet (10 mg total) by mouth at bedtime.   famotidine  20 MG tablet Commonly known as: PEPCID  Take 1 tablet (20 mg total) by mouth at bedtime.   fluconazole  150 MG tablet Commonly known as: DIFLUCAN  TAKE 1 TABLET BY MOUTH EVERY OTHER WEEK   fluticasone  50 MCG/ACT nasal spray Commonly known as: FLONASE  Place 1-2 sprays into both nostrils daily. USE ONE SPRAY IN EACH NOSTRIL ONCE DAILY   Fluzone High-Dose 0.5 ML injection Generic drug: Influenza vac split quadrivalent PF Inject 0.5 mLs into the muscle once.   ipratropium 0.06 % nasal spray Commonly known as: ATROVENT  2 sprays each nostril every 6 hours if needed.   Lumigan 0.01 % Soln Generic drug: bimatoprost Place 1 drop into both eyes at bedtime.   nystatin  100000  UNIT/ML suspension Commonly known as: MYCOSTATIN  SWISH AND SWALLOW 5 MLS BY MOUTH EVERY MORNING   omeprazole  40 MG capsule Commonly known as: PRILOSEC Take 2 capsules (80 mg total) by mouth 2 (two) times daily.   PRESERVISION AREDS 2+MULTI VIT PO Take 1 capsule by mouth in the morning and at bedtime.   rosuvastatin  5 MG tablet Commonly known as: CRESTOR  TAKE ONE TABLET BY MOUTH ONCE DAILY   SIMPLY SALINE NA Place 1 spray into the nose 2 (two) times daily.   Spikevax syringe Generic drug: COVID-19 mRNA vaccine Inject 0.5 mLs into the muscle once.   SYSTANE  OP Place 1 drop into both eyes as needed (dry eyes).   Vitamin D 50 MCG (2000 UT) tablet Take 2,000 Units by mouth daily.     Past Medical History:  Diagnosis Date   Allergic rhinoconjunctivitis 08/02/2015   Allergies    Arthritis    hands   Asthma    Dr Camellia Denis   Asthma, well controlled 10/31/2017   Blepharitis    Coronary artery disease 06/21/2021   GERD (gastroesophageal reflux disease)    Glaucoma    Hereditary and idiopathic peripheral neuropathy 02/16/2016   Hx of fracture    Left wrist 2018; right patella (separate incident) 2018; used braces   Left wrist injury 01/18/2017   LPRD (laryngopharyngeal reflux disease) 08/02/2015   Macular degeneration    Memory loss    in study at [place], [place] 02/2020   Migraine    Moderate persistent asthma 08/02/2015   Osteopenia    Sensorimotor neuropathy    moderate-severe with chronic denervation- Dr Tobie Nakai, oral 10/31/2017   Vertigo    occasional positional    Past Surgical History:  Procedure Laterality Date   BUNIONECTOMY Right    Right great toe and little toe, pins in both, [place], [place]    CATARACT EXTRACTION, BILATERAL     with implants; R eye 08/2017 and L eye 09/2017   Cervical decompression and fusion  1997   COLONOSCOPY     2003, 2008, 2013   ECTOPIC PREGNANCY SURGERY     injection laryngeal plasty 2008     per notes from [facility]   KNEE SURGERY Left    meniscus @ [employer], [place], [place]   nissan infundibulm     SALPINGECTOMY Right    [place], [place]   SHOULDER ARTHROSCOPY WITH OPEN ROTATOR CUFF REPAIR AND DISTAL CLAVICLE ACROMINECTOMY Left 03/13/2022   Procedure: LEFT SHOULDER ARTHROSCOPY, WITH MINI OPEN ROTATOR CUFF TEAR REPAIR & BICEPS TENODESIS;  Surgeon: Addie Cordella Hamilton, MD;  Location: [facility] OR;  Service: Orthopedics;  Laterality: Left;   THROAT SURGERY     vocal cords injected with liquid bone  2005   [facility], [place]    Review of systems negative except as  noted in HPI / PMHx or noted below:  Review of Systems  Constitutional: Negative.   HENT: Negative.    Eyes: Negative.   Respiratory: Negative.    Cardiovascular: Negative.   Gastrointestinal: Negative.   Genitourinary: Negative.   Musculoskeletal: Negative.   Skin: Negative.   Neurological: Negative.   Endo/Heme/Allergies: Negative.   Psychiatric/Behavioral: Negative.       Objective:   Vitals:   09/17/23 1036  BP: 116/66  Pulse: 60  Resp: 18  Temp: 97.8 F (36.6 C)  SpO2: 98%   Height: 5' 6 (167.6 cm)  Weight: 121 lb 12.8 oz (55.2 kg)   Physical Exam Constitutional:  Appearance: She is not diaphoretic.  HENT:     Head: Normocephalic.     Right Ear: Tympanic membrane, ear canal and external ear normal.     Left Ear: Tympanic membrane, ear canal and external ear normal.     Nose: Nose normal. No mucosal edema or rhinorrhea.     Mouth/Throat:     Pharynx: Uvula midline. No oropharyngeal exudate.  Eyes:     Conjunctiva/sclera: Conjunctivae normal.  Neck:     Thyroid : No thyromegaly.     Trachea: Trachea normal. No tracheal tenderness or tracheal deviation.  Cardiovascular:     Rate and Rhythm: Normal rate and regular rhythm.     Heart sounds: Normal heart sounds, S1 normal and S2 normal. No murmur heard. Pulmonary:     Effort: No respiratory distress.     Breath sounds: Normal breath sounds. No stridor. No wheezing or rales.  Lymphadenopathy:     Head:     Right side of head: No tonsillar adenopathy.     Left side of head: No tonsillar adenopathy.     Cervical: No cervical adenopathy.  Skin:    Findings: No erythema or rash.     Nails: There is no clubbing.  Neurological:     Mental Status: She is alert.     Diagnostics:    Spirometry was performed and demonstrated an FEV1 of 1.70 at 85 % of predicted.  Results of a chest x-ray obtained 14 August 2023 identified the following:  Unchanged cardiac silhouette and mediastinal  contours. Redemonstrated calcified granuloma within medial aspect of the left upper lung, similar to remote examination performed in 2005. No focal airspace opacities. No pleural effusion or pneumothorax. No evidence of edema. No acute osseous abnormalities. Surgical clips overlie the left upper abdominal quadrant.  Assessment and Plan:   1. Asthma, moderate persistent, well-controlled   2. Other allergic rhinitis   3. LPRD (laryngopharyngeal reflux disease)   4. DOE (dyspnea on exertion)   5. Thrush, oral    1. Treat airway inflammation:  A. Breztri  - 2 inhalations 1-2 times per day  B. Nasal fluticasone  1-2 spray each nostril 1 time per day  2. Treat reflux:  A. Omeprazole  40 mg in AM + 40 mg in PM  B. Famotidine  20 mg in AM + 20 mg in PM C. OTC Omeprazole  20 mg - 2 in AM + 2 in PM D. Visit with GI doctor  3. Treat thrush:   A. Nystatin  solution 1 teaspoon swish and swallow every morning B. Diflucan  150 mg tablet every other week  4. If needed:  A. OTC Mucinex DM B. Azelastine  C. ProAir  HFA  D. nasal ipratropium 0.06% - 2 sprays each nostril every 6 hours    6. Keep appointment with cardiology for ECHO  7. Return to clinic in 6 months or earlier if problem  8. Influenza = Tamiflu. Covid = Paxlovid  Rolinda appears to be doing better regarding her dyspnea on exertion while she is undergoing her progressive exercise program but I think she still needs to visit with cardiology and get an echo to look at her heart function.  She will remain on anti-inflammatory agents for her airway as noted above.  Her reflux is very active and this activity appears to correlate with the fact that she is not using her usual dose of omeprazole  which is 80 mg twice a day.  Will get her to use some over-the-counter omeprazole  to add up to that number and  she can visit with her GI regarding further management of this issue.  I will see her back in this clinic in 6 months or earlier if there is a  problem.  Camellia Denis, MD Allergy / Immunology [facility]

## 2023-09-17 NOTE — Patient Instructions (Addendum)
   1. Treat airway inflammation:  A. Breztri  - 2 inhalations 1-2 times per day  B. Nasal fluticasone  1-2 spray each nostril 1 time per day  2. Treat reflux:  A. Omeprazole  40 mg in AM + 40 mg in PM  B. Famotidine  20 mg in AM + 20 mg in PM C. OTC Omeprazole  20 mg - 2 in AM + 2 in PM D. Visit with GI doctor  3. Treat thrush:   A. Nystatin  solution 1 teaspoon swish and swallow every morning B. Diflucan  150 mg tablet every other week  4. If needed:  A. OTC Mucinex DM B. Azelastine  C. ProAir  HFA  D. nasal ipratropium 0.06% - 2 sprays each nostril every 6 hours    6. Keep appointment with cardiology for ECHO  7. Return to clinic in 6 months or earlier if problem  8. Influenza = Tamiflu. Covid = Paxlovid

## 2023-09-18 ENCOUNTER — Encounter: Payer: Self-pay | Admitting: Allergy and Immunology

## 2023-10-03 ENCOUNTER — Other Ambulatory Visit: Payer: Self-pay | Admitting: Neurology

## 2023-10-03 ENCOUNTER — Other Ambulatory Visit: Payer: Self-pay | Admitting: Allergy and Immunology

## 2023-10-29 ENCOUNTER — Encounter: Payer: Self-pay | Admitting: Gastroenterology

## 2023-10-29 ENCOUNTER — Ambulatory Visit: Payer: PPO | Admitting: Gastroenterology

## 2023-10-29 ENCOUNTER — Ambulatory Visit
Admission: RE | Admit: 2023-10-29 | Discharge: 2023-10-29 | Disposition: A | Discharge status: Home / Self Care | Payer: PPO | Source: Ambulatory Visit | Attending: Gastroenterology | Admitting: Gastroenterology

## 2023-10-29 VITALS — BP 116/76 | HR 63 | Ht 66.0 in | Wt 121.6 lb

## 2023-10-29 DIAGNOSIS — R1110 Vomiting, unspecified: Secondary | ICD-10-CM

## 2023-10-29 DIAGNOSIS — R198 Other specified symptoms and signs involving the digestive system and abdomen: Secondary | ICD-10-CM | POA: Diagnosis not present

## 2023-10-29 DIAGNOSIS — R110 Nausea: Secondary | ICD-10-CM | POA: Diagnosis not present

## 2023-10-29 DIAGNOSIS — R194 Change in bowel habit: Secondary | ICD-10-CM

## 2023-10-29 DIAGNOSIS — K219 Gastro-esophageal reflux disease without esophagitis: Secondary | ICD-10-CM | POA: Diagnosis not present

## 2023-10-29 DIAGNOSIS — R140 Abdominal distension (gaseous): Secondary | ICD-10-CM | POA: Diagnosis not present

## 2023-10-29 DIAGNOSIS — R142 Eructation: Secondary | ICD-10-CM

## 2023-10-29 DIAGNOSIS — I878 Other specified disorders of veins: Secondary | ICD-10-CM | POA: Diagnosis not present

## 2023-10-29 MED ORDER — ONDANSETRON 4 MG PO TBDP: 4.0000 mg | ORAL_TABLET | Freq: Three times a day (TID) | ORAL | 0 refills | Status: DC | PRN

## 2023-10-29 MED ORDER — ONDANSETRON 4 MG PO TBDP: 4.0000 mg | ORAL_TABLET | Freq: Three times a day (TID) | ORAL | 2 refills | Status: DC | PRN

## 2023-10-29 NOTE — Patient Instructions (Signed)
 Your provider has requested that you have an abdominal x ray before leaving today. Please go to the basement floor to our Radiology department for the test.   You have been scheduled for a Barium Esophogram at [facility] (1st floor of the hospital) on Monday, 11/05/23 at 10:00 AM. Please arrive 30 minutes prior to your appointment for registration. Make certain not to have anything to eat or drink 3 hours prior to your test. If you need to reschedule for any reason, please contact radiology at [phone] to do so. __________________________________________________________________ A barium swallow is an examination that concentrates on views of the esophagus. This tends to be a double contrast exam (barium and two liquids which, when combined, create a gas to distend the wall of the oesophagus) or single contrast (non-ionic iodine based). The study is usually tailored to your symptoms so a good history is essential. Attention is paid during the study to the form, structure and configuration of the esophagus, looking for functional disorders (such as aspiration, dysphagia, achalasia, motility and reflux) EXAMINATION You may be asked to change into a gown, depending on the type of swallow being performed. A radiologist and radiographer will perform the procedure. The radiologist will advise you of the type of contrast selected for your procedure and direct you during the exam. You will be asked to stand, sit or lie in several different positions and to hold a small amount of fluid in your mouth before being asked to swallow while the imaging is performed .In some instances you may be asked to swallow barium coated marshmallows to assess the motility of a solid food bolus. The exam can be recorded as a digital or video fluoroscopy procedure. POST PROCEDURE It will take 1-2 days for the barium to pass through your system. To facilitate this, it is important, unless otherwise directed, to increase  your fluids for the next 24-48hrs and to resume your normal diet.  This test typically takes about 30 minutes to perform. __________________________________________________________________________________   We have sent the following medications to your pharmacy for you to pick up at your convenience: Zofran 4 mg every 6 hours  Please purchase the following medications over the counter and take as directed: Fiber supplement, take daily.  We will request your records [facility].  Thank you for trusting me with your gastrointestinal care!   Boone Master, PA   _______________________________________________________  If your blood pressure at your visit was 140/90 or greater, please contact your primary care physician to follow up on this.  _______________________________________________________  If you are age 83 or older, your body mass index should be between 23-30. Your Body mass index is 19.62 kg/m. If this is out of the aforementioned range listed, please consider follow up with your Primary Care Provider.  If you are age 60 or younger, your body mass index should be between 19-25. Your Body mass index is 19.62 kg/m. If this is out of the aformentioned range listed, please consider follow up with your Primary Care Provider.   ________________________________________________________  The [facility] providers would like to encourage you to use [facility] to communicate with providers for non-urgent requests or questions.  Due to long hold times on the telephone, sending your provider a message by [facility] may be a faster and more efficient way to get a response.  Please allow 48 business hours for a response.  Please remember that this is for non-urgent requests.  _______________________________________________________

## 2023-10-29 NOTE — Progress Notes (Addendum)
 Chief Complaint: Dry heaves Primary GI MD: Gentry Fitz (previously Dr. Vivien Rossetti GI)  HPI: Victoria Trevino is an 83 year old female who presents with dry heaves and altered bowel movements. She was referred by her respiratory doctor for further evaluation.  Since December, she has been experiencing dry heaves that occur at any time of the day, often accompanied by belching and occasional nausea. These symptoms are not associated with eating, and she does not typically experience heartburn. She is currently taking omeprazole 40mg  and famotidine 20mg  twice daily, which have not alleviated her symptoms but do control her longstanding history of GERD.Marland Kitchen Attempts to reduce the dosage of omeprazole have resulted in indigestion or heartburn.  She states all this started after she had a upper respiratory illness early December 2024.  Regarding her bowel movements, she describes a change from her usual pattern. Previously, she would have a small bowel movement in the morning followed by a larger one after breakfast. Since the onset of her symptoms, she experiences the urge to defecate with minimal output, sometimes resulting in pellet-like or stringy stools. She does not feel constipated and reports having two good-sized bowel movements on the day of the visit.  No weight loss; she notes weight gain. No significant changes in diet or stress levels. No difficulty swallowing or abdominal pain. She denies any family history of colon cancer and had a colonoscopy in 2022, which was up to date.     We unfortunately do not have records available from [facility]  Past Medical History:  Diagnosis Date   Allergic rhinoconjunctivitis 08/02/2015   Allergies    Arthritis    hands   Asthma    Dr Laurette Schimke   Asthma, well controlled 10/31/2017   Blepharitis    Coronary artery disease 06/21/2021   GERD (gastroesophageal reflux disease)    Glaucoma    Hereditary and idiopathic peripheral neuropathy 02/16/2016    Hx of fracture    Left wrist 2018; right patella (separate incident) 2018; used braces   Left wrist injury 01/18/2017   LPRD (laryngopharyngeal reflux disease) 08/02/2015   Macular degeneration    Memory loss    in study at [place], [place] 02/2020   Migraine    Moderate persistent asthma 08/02/2015   Osteopenia    Sensorimotor neuropathy    moderate-severe with chronic denervation- Dr Cyndi Lennert, oral 10/31/2017   Vertigo    occasional positional    Past Surgical History:  Procedure Laterality Date   BUNIONECTOMY Right    Right great toe and little toe, pins in both, [place], [place]   CATARACT EXTRACTION, BILATERAL     with implants; R eye 08/2017 and L eye 09/2017   Cervical decompression and fusion  1997   COLONOSCOPY     2003, 2008, 2013   ECTOPIC PREGNANCY SURGERY     injection laryngeal plasty 2008     per notes from [facility]   KNEE SURGERY Left    meniscus @ [employer], [place], [place]   nissan infundibulm     SALPINGECTOMY Right    [place], [place]   SHOULDER ARTHROSCOPY WITH OPEN ROTATOR CUFF REPAIR AND DISTAL CLAVICLE ACROMINECTOMY Left 03/13/2022   Procedure: LEFT SHOULDER ARTHROSCOPY, WITH MINI OPEN ROTATOR CUFF TEAR REPAIR & BICEPS TENODESIS;  Surgeon: Cammy Copa, MD;  Location: [facility] OR;  Service: Orthopedics;  Laterality: Left;   THROAT SURGERY     vocal cords injected with liquid bone  2005  [place], [place]    Current Outpatient Medications  Medication Sig Dispense Refill   acetaminophen (TYLENOL) 500 MG tablet Take 500 mg by mouth in the morning and at bedtime.     albuterol (PROVENTIL HFA) 108 (90 Base) MCG/ACT inhaler Inhale 2 puffs into the lungs every 6 (six) hours as needed for wheezing or shortness of breath. 18 g 1   antiseptic oral rinse (BIOTENE) LIQD 15 mLs by Mouth Rinse route as needed.     aspirin EC 81 MG tablet Take 81 mg by mouth daily. Swallow whole.     Azelastine HCl 137 MCG/SPRAY SOLN Place 2 sprays into  both nostrils 2 (two) times daily. 90 mL 1   bimatoprost (LUMIGAN) 0.01 % SOLN Place 1 drop into both eyes at bedtime.     Budeson-Glycopyrrol-Formoterol (BREZTRI AEROSPHERE) 160-9-4.8 MCG/ACT AERO Inhale 2 puffs into the lungs 2 (two) times daily. 2 puffs 1-2 times per day. 32.1 g 1   Calcium Carbonate-Vitamin D 600-200 MG-UNIT TABS Take 1 tablet by mouth daily.     donepezil (ARICEPT) 10 MG tablet TAKE 1 TABLET BY MOUTH AT BEDTIME 90 tablet 0   famotidine (PEPCID) 20 MG tablet Take 1 tablet (20 mg total) by mouth 2 (two) times daily. 180 tablet 1   fluconazole (DIFLUCAN) 150 MG tablet Take 1 tablet (150 mg total) by mouth every 14 (fourteen) days. 10 tablet 0   fluticasone (FLONASE) 50 MCG/ACT nasal spray Place 2 sprays into both nostrils every morning. USE ONE SPRAY IN EACH NOSTRIL ONCE DAILY (Patient taking differently: Place 2 sprays into both nostrils at bedtime. USE ONE SPRAY IN EACH NOSTRIL ONCE DAILY) 48 g 1   FLUZONE HIGH-DOSE 0.5 ML injection Inject 0.5 mLs into the muscle once.     ipratropium (ATROVENT) 0.06 % nasal spray 2 sprays each nostril every 6 hours if needed. (Patient taking differently: Place 1 spray into the nose 2 (two) times daily. 2 sprays each nostril every 6 hours if needed.) 45 mL 1   Multiple Vitamins-Minerals (PRESERVISION AREDS 2+MULTI VIT PO) Take 1 capsule by mouth in the morning and at bedtime.     nystatin (MYCOSTATIN) 100000 UNIT/ML suspension SWISH AND SWALLOW 5 MLS BY MOUTH EVERY MORNING 473 mL 1   omeprazole (PRILOSEC) 40 MG capsule Take 2 capsules (80 mg total) by mouth 2 (two) times daily. 360 capsule 1   Polyethyl Glycol-Propyl Glycol (SYSTANE OP) Place 1 drop into both eyes as needed (dry eyes).     rosuvastatin (CRESTOR) 5 MG tablet TAKE ONE TABLET BY MOUTH ONCE DAILY 90 tablet 3   SIMPLY SALINE NA Place 1 spray into the nose 2 (two) times daily.     SPIKEVAX syringe Inject 0.5 mLs into the muscle once.     VITAMIN D-VITAMIN K PO Take 1 tablet by mouth 2  (two) times daily.     ondansetron (ZOFRAN-ODT) 4 MG disintegrating tablet Take 1 tablet (4 mg total) by mouth every 8 (eight) hours as needed for nausea or vomiting. 30 tablet 2   No current facility-administered medications for this visit.    Allergies as of 10/29/2023 - Review Complete 10/29/2023  Allergen Reaction Noted   Atorvastatin Other (See Comments) 08/13/2023   Diazepam Other (See Comments) 11/25/2019    Family History  Problem Relation Age of Onset   Alzheimer's disease Mother        Deceased, 17   Hypercholesterolemia Mother    Heart attack Father    Congestive Heart Failure  Father        Deceased, 59   Emphysema Father        smoker   CAD Father    Benign prostatic hyperplasia Father    Skin cancer Sister    Allergic rhinitis Sister    Hypertension Sister    Hypercholesterolemia Sister    Obesity Sister    Irritable bowel syndrome Sister    Aneurysm Brother    Skin cancer Brother        basal cell   Memory loss Brother    COPD Brother    Congestive Heart Failure Brother    Emphysema Brother    Stroke Maternal Grandmother    Dementia Maternal Grandmother    Prostate cancer Maternal Grandfather    Dementia Maternal Grandfather    Diabetes Son    Hypertension Son    Diabetes Daughter    Asthma Daughter    Stroke Paternal Grandmother    CAD Paternal Grandfather     Social History   Socioeconomic History   Marital status: Widowed    Spouse name: Not on file   Number of children: Not on file   Years of education: 3 yrs nursing school after H.S.   Highest education level: Not on file  Occupational History   Not on file  Tobacco Use   Smoking status: Never   Smokeless tobacco: Never  Vaping Use   Vaping status: Never Used  Substance and Sexual Activity   Alcohol use: Not Currently    Comment: was a glass of wine one time per year   Drug use: Never   Sexual activity: Yes    Birth control/protection: Condom  Other Topics Concern   Not on  file  Social History Narrative   Lives alone in a one story home.  Has 2 children.  Retired Charity fundraiser.  Husband passed away in 2016-10-18.        Son lives next to her   Caffeine: NONE since 2000, was previously 1 cup coffee/day from 1969-2000   Social Drivers of Health   Financial Resource Strain: Not on file  Food Insecurity: Not on file  Transportation Needs: Not on file  Physical Activity: Not on file  Stress: Not on file  Social Connections: Not on file  Intimate Partner Violence: Not on file    Review of Systems:    Constitutional: No weight loss, fever, chills, weakness or fatigue HEENT: Eyes: No change in vision               Ears, Nose, Throat:  No change in hearing or congestion Skin: No rash or itching Cardiovascular: No chest pain, chest pressure or palpitations   Respiratory: No SOB or cough Gastrointestinal: See HPI and otherwise negative Genitourinary: No dysuria or change in urinary frequency Neurological: No headache, dizziness or syncope Musculoskeletal: No new muscle or joint pain Hematologic: No bleeding or bruising Psychiatric: No history of depression or anxiety    Physical Exam:  Vital signs: BP 116/76   Pulse 63   Ht 5\' 6"  (1.676 m)   Wt 121 lb 9 oz (55.1 kg)   BMI 19.62 kg/m   Constitutional: Thin, frail, elderly pleasant female Head:  Normocephalic and atraumatic. Eyes:   PEERL, EOMI. No icterus. Conjunctiva pink. Respiratory: Respirations even and unlabored. Lungs clear to auscultation bilaterally.   No wheezes, crackles, or rhonchi.  Cardiovascular:  Regular rate and rhythm. No peripheral edema, cyanosis or pallor.  Gastrointestinal:  Soft, nondistended, nontender. No rebound  or guarding. Normal bowel sounds. No appreciable masses or hepatomegaly. Rectal:  Not performed.  Msk:  Symmetrical without gross deformities. Without edema, no deformity or joint abnormality.  Neurologic:  Alert and  oriented x4;  grossly normal neurologically.  Skin:    Dry and intact without significant lesions or rashes. Psychiatric: Oriented to person, place and time. Demonstrates good judgement and reason without abnormal affect or behaviors.   RELEVANT LABS AND IMAGING: CBC    Component Value Date/Time   WBC 5.9 03/13/2022 1131   RBC 4.25 03/13/2022 1131   HGB 13.8 03/13/2022 1131   HCT 44.0 03/13/2022 1131   PLT 156 03/13/2022 1131   MCV 103.5 (H) 03/13/2022 1131   MCH 32.5 03/13/2022 1131   MCHC 31.4 03/13/2022 1131   RDW 13.2 03/13/2022 1131    CMP     Component Value Date/Time   NA 140 03/13/2022 1131   NA 142 02/06/2019 1019   K 4.3 03/13/2022 1131   CL 108 03/13/2022 1131   CO2 22 03/13/2022 1131   GLUCOSE 81 03/13/2022 1131   BUN 24 (H) 03/13/2022 1131   BUN 18 02/06/2019 1019   CREATININE 0.89 03/13/2022 1131   CALCIUM 9.4 03/13/2022 1131   ALT 20 05/28/2019 1100   GFRNONAA >60 03/13/2022 1131   GFRAA 68 02/06/2019 1019     Assessment/Plan:      Dry heaves Eructation Nausea Sudden onset of dry heaves, belching, and nausea since December after recovering from viral respiratory illness that involved significant coughing.  No dysphagia. She can also often have dry heaves without the nausea.  Not associated with eating or heartburn. Current treatment with omeprazole and famotidine not alleviating symptoms.  No new medications or extrinsic stressors.  No history of diabetes.  Interesting as the symptoms are not associated with eating.  No previous EGD though she does have a longstanding history of GERD.  Ideally would like to hold off on procedures due to patient's age and comorbidities.  No associated bloating. Suspect this is a sequela of her URI. -- Order barium swallow with tab to assess esophageal motility, esophagus and possible reflux -- Consider endoscopy if barium swallow shows abnormalities. -- Prescribe Zofran for nausea relief. -- Can consider baclofen if the above is negative, would have to be mindful of potential  side effects. - Also could consider SIBO testing  Altered Bowel Movements Change in bowel movements from normal to pellet-like or stringy, sometimes regular. Not feeling constipated but bowel movements not consistent.  Recent colonoscopy 2 years ago.  We will obtain these records. --Start fiber supplement to regulate bowel movements. --Consider adding Miralax if fiber supplement is not effective. --Order abdominal x-ray to rule out severe constipation. -- Obtain previous colonoscopy records  General Health Maintenance -Request previous medical records from Dr. Donavan Burnet office. -Review last colonoscopy results (performed in 2022).     Assigned to Dr. Doy Hutching today  Boone Master, PA-C [facility] 10/29/2023, 1:57 PM  Cc: Jessica Priest, MD  I have reviewed the clinic note as outlined by Boone Master, PA and agree with the assessment, plan and medical decision making.  Ms. Tencza is referred to the office for symptoms of dry heaving without vomiting as well as a change in bowel habits.  Upper GI symptoms have occurred in the setting of recent respiratory infection which is now resolving.  Symptoms have not been responsive to antacid therapy in the form of PPI or H2 blocker.  Agree with barium swallow and  limiting invasive test given age and medical comorbidities.  She also reports smaller caliber stools.  Last colonoscopy performed 2 years ago.  Will utilize fiber and laxative agents while awaiting record review.  Maren Beach, MD

## 2023-11-05 ENCOUNTER — Other Ambulatory Visit: Payer: Self-pay | Admitting: Cardiology

## 2023-11-05 ENCOUNTER — Ambulatory Visit (HOSPITAL_COMMUNITY)
Admission: RE | Admit: 2023-11-05 | Discharge: 2023-11-05 | Disposition: A | Discharge status: Home / Self Care | Payer: PPO | Source: Ambulatory Visit | Attending: Gastroenterology | Admitting: Gastroenterology

## 2023-11-05 DIAGNOSIS — R194 Change in bowel habit: Secondary | ICD-10-CM

## 2023-11-05 DIAGNOSIS — R110 Nausea: Secondary | ICD-10-CM | POA: Diagnosis present

## 2023-11-05 DIAGNOSIS — K224 Dyskinesia of esophagus: Secondary | ICD-10-CM | POA: Diagnosis not present

## 2023-11-05 DIAGNOSIS — K219 Gastro-esophageal reflux disease without esophagitis: Secondary | ICD-10-CM

## 2023-11-05 DIAGNOSIS — R1110 Vomiting, unspecified: Secondary | ICD-10-CM | POA: Diagnosis present

## 2023-11-05 DIAGNOSIS — R112 Nausea with vomiting, unspecified: Secondary | ICD-10-CM | POA: Diagnosis not present

## 2023-11-28 ENCOUNTER — Encounter: Payer: Self-pay | Admitting: Adult Health

## 2023-11-28 ENCOUNTER — Ambulatory Visit: Payer: PPO | Admitting: Adult Health

## 2023-11-28 VITALS — BP 109/53 | HR 62 | Ht 68.25 in | Wt 122.4 lb

## 2023-11-28 DIAGNOSIS — R4189 Other symptoms and signs involving cognitive functions and awareness: Secondary | ICD-10-CM

## 2023-11-28 MED ORDER — DONEPEZIL HCL 10 MG PO TABS: 10.0000 mg | ORAL_TABLET | Freq: Every day | ORAL | 3 refills | Status: AC

## 2023-11-28 NOTE — Patient Instructions (Signed)
Your Plan:  Continue Aricept 10 mg at bedtime If your symptoms worsen or you develop new symptoms please let us know.   Thank you for coming to see us at [facility]. I hope we have been able to provide you high quality care today.  You may receive a patient satisfaction survey over the next few weeks. We would appreciate your feedback and comments so that we may continue to improve ourselves and the health of our patients.  

## 2023-11-28 NOTE — Progress Notes (Signed)
 PATIENT: Victoria Trevino DOB: November 04, 1940  REASON FOR VISIT: follow up HISTORY FROM: patient PRIMARY NEUROLOGIST: Dr. Lucia Gaskins  Chief Complaint  Patient presents with   Follow-up    Rm 19, alone.  Stable.       HISTORY OF PRESENT ILLNESS: Today 11/28/23  Victoria Trevino is a 83 y.o. female who has been followed in this office for memory disturbance. Returns today for follow-up.  She reports that her memory has remained stable.  She lives at home alone.  She is able to complete all ADLs independently.  She manages her own household chores.  She manages her own medications appointments and finances.  She remains on Aricept 10 mg at bedtime.  She states that she does not sleep well due to having to get up to urinate frequently.  Denies any changes with her mood or behavior.  Overall she is doing really well.  Denies any new issues.  Returns today for an evaluation.  HISTORY 11/23/2021: Spent 20 minutes reviewing Dr. Marvetta Gibbons notes, essentially he did not contact neurodegenerative disorder, overall the results were quite positive, patient's MMSE is stable, we discussed in detail, I reassured patient, she is to see Dr. Kieth Brightly again in 9 months, I spent 12 additional minutes with patient answering any questions, reassuring her, she was very quite happy.  At this time she can follow-up as needed, I will continue to follow when she has update with Dr. Kieth Brightly.  Dr. Marvetta Gibbons notes can be read in epic.   Patient complains of symptoms per HPI as well as the following symptoms: memory loss . Pertinent negatives and positives per HPI. All others negative     05/25/2021: She has an appointment with Dr. Kieth Brightly next week. She is concerned about memory. She puts her cell phone down and forgets where she put it so she bought a tool belt to put her phone in it. Patient is stable today, I tried to reassure her that people with dementia progress and she does not seem to be progressing.Marland Kitchen  Here  independently, she performs all her IADls and ADLs. 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 Her MMSE is stable. She lives right next door to her son who she says is wonderful and she is enjoying life and gardening and loves being outdoors. She loves to read, she watches TV, she loves hallmark, she is active, no depression or anxiety.    HPI:  Victoria Trevino is a 83 y.o. female here as requested by Merlene Laughter, MD for memory loss.  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 Allena Katz in the past, benign essential tremor, memory lost diagnosed with mild cognitive impairment June 2021 at [facility], I reviewed "care everywhere" but any research notes are not available for me to review.  I reviewed Dr. Laverle Hobby notes, she has a history of Alzheimer's disease, she is in a study at [facility] [employer] and they have told her she has some concerning memory loss, she still independent in all of her activities of daily living, she is under some stress at home as she is caring for a family member who has definite cognitive limitations for dementia and this has been a difficult management problem for her. She has been with [facility] since 2015 participating in  drug studies.    Patient is here with son. She has had a lot of work up at [facility]. Her memory is not as good as it once was. They have not said anything to her about the MRIs, the amyloid testing, she doesn't get to know that. She has had extensive testing at [facility]. They have told her she has progressed to the point she has Alzheimer's disease. I discussed going back to [facility] because I do not have access to any of their research results. She is having difficulty with dates. Here with her son. She can  compensate. She doesn't miss any of her medications. She uses a calendar. 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 Her son says she has a routine. She doesn't get lost. She performs her own ADLs and IADLs. She lives independently, her son lives close by and checks on her daily. She gardens, she has a Production manager garden. She is worried that she doesn't get prevention. Her mother died at 5. Mother had Alzheimer,  her grandmother was in her 10s with Alzheimers. Still driving, still doing well, more short-term memory. Good insight and judgement. 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 She has only fallen twice that she knows of, mechanical fall and once when she had the flu, she works very hard due to her imbalance (she has neuropathy). She eats very healthy.    Reviewed notes, labs and imaging from outside physicians, which showed:    Vitamin D was 85.2, CMP was normal with BUN 17 and creatinine 0.81, CBC was normal, B12 472, magnesium 2.3, February 05, 2020.    REVIEW OF SYSTEMS: Out of a complete 14 system review of symptoms, the patient complains only of the following symptoms, and all other reviewed systems are negative.  ALLERGIES: Allergies  Allergen Reactions   Atorvastatin Other (See Comments)    Dizziness   Diazepam Other (See Comments)    Hypersensitivity - over sedated    HOME MEDICATIONS: Outpatient Medications Prior to Visit  Medication Sig Dispense Refill   acetaminophen (TYLENOL) 500 MG tablet Take 500 mg by mouth in the morning and at bedtime.     albuterol (PROVENTIL HFA) 108 (90 Base) MCG/ACT inhaler Inhale 2 puffs into the lungs every 6 (six) hours as needed for wheezing or shortness of breath. 18 g 1   antiseptic oral rinse (BIOTENE) LIQD 15 mLs by Mouth Rinse route as needed.     aspirin EC 81 MG tablet Take 81 mg by mouth daily. Swallow whole.     Azelastine HCl 137  MCG/SPRAY SOLN Place 2 sprays into both nostrils 2 (two) times daily. 90 mL 1   bimatoprost (LUMIGAN) 0.01 % SOLN Place 1 drop into both eyes at bedtime.     Budeson-Glycopyrrol-Formoterol (BREZTRI AEROSPHERE) 160-9-4.8 MCG/ACT AERO Inhale 2 puffs into the lungs 2 (two) times daily. 2 puffs 1-2 times per day. 32.1 g 1   Calcium Carbonate-Vitamin D 600-200 MG-UNIT TABS Take 1 tablet by mouth daily.     donepezil (ARICEPT) 10 MG tablet TAKE 1 TABLET BY MOUTH AT BEDTIME 90 tablet 0   famotidine (PEPCID) 20 MG tablet Take 1 tablet (20 mg total) by mouth 2 (two) times daily. 180 tablet 1   fluconazole (DIFLUCAN) 150 MG tablet Take 1 tablet (150 mg total) by mouth every 14 (fourteen) days. 10  tablet 0   fluticasone (FLONASE) 50 MCG/ACT nasal spray Place 2 sprays into both nostrils every morning. USE ONE SPRAY IN EACH NOSTRIL ONCE DAILY (Patient taking differently: Place 2 sprays into both nostrils at bedtime. USE ONE SPRAY IN EACH NOSTRIL ONCE DAILY) 48 g 1   FLUZONE HIGH-DOSE 0.5 ML injection Inject 0.5 mLs into the muscle once.     ipratropium (ATROVENT) 0.06 % nasal spray 2 sprays each nostril every 6 hours if needed. (Patient taking differently: Place 1 spray into the nose 2 (two) times daily. 2 sprays each nostril every 6 hours if needed.) 45 mL 1   Multiple Vitamins-Minerals (PRESERVISION AREDS 2+MULTI VIT PO) Take 1 capsule by mouth in the morning and at bedtime.     nystatin (MYCOSTATIN) 100000 UNIT/ML suspension SWISH AND SWALLOW 5 MLS BY MOUTH EVERY MORNING 473 mL 1   omeprazole (PRILOSEC) 40 MG capsule Take 2 capsules (80 mg total) by mouth 2 (two) times daily. 360 capsule 1   ondansetron (ZOFRAN-ODT) 4 MG disintegrating tablet Take 1 tablet (4 mg total) by mouth every 8 (eight) hours as needed for nausea or vomiting. 30 tablet 2   Polyethyl Glycol-Propyl Glycol (SYSTANE OP) Place 1 drop into both eyes as needed (dry eyes).     rosuvastatin (CRESTOR) 5 MG tablet TAKE 1 TABLET BY MOUTH ONCE DAILY  30 tablet 0   SIMPLY SALINE NA Place 1 spray into the nose 2 (two) times daily.     SPIKEVAX syringe Inject 0.5 mLs into the muscle once.     VITAMIN D-VITAMIN K PO Take 1 tablet by mouth 2 (two) times daily.     No facility-administered medications prior to visit.    PAST MEDICAL HISTORY: Past Medical History:  Diagnosis Date   Allergic rhinoconjunctivitis 08/02/2015   Allergies    Arthritis    hands   Asthma    Dr Laurette Schimke   Asthma, well controlled 10/31/2017   Blepharitis    Coronary artery disease 06/21/2021   GERD (gastroesophageal reflux disease)    Glaucoma    Hereditary and idiopathic peripheral neuropathy 02/16/2016   Hx of fracture    Left wrist 2018; right patella (separate incident) 2018; used braces   Left wrist injury 01/18/2017   LPRD (laryngopharyngeal reflux disease) 08/02/2015   Macular degeneration    Memory loss    in study at [place], [place] 02/2020   Migraine    Moderate persistent asthma 08/02/2015   Osteopenia    Sensorimotor neuropathy    moderate-severe with chronic denervation- Dr Cyndi Lennert, oral 10/31/2017   Vertigo    occasional positional    PAST SURGICAL HISTORY: Past Surgical History:  Procedure Laterality Date   BUNIONECTOMY Right    Right great toe and little toe, pins in both, [place], [place]   CATARACT EXTRACTION, BILATERAL     with implants; R eye 08/2017 and L eye 09/2017   Cervical decompression and fusion  1997   COLONOSCOPY     2003, 2008, 2013   ECTOPIC PREGNANCY SURGERY     injection laryngeal plasty 2008     per notes from [facility]   KNEE SURGERY Left    meniscus @ [employer], [place], [place]   nissan infundibulm     SALPINGECTOMY Right    [place], [place]   SHOULDER ARTHROSCOPY WITH OPEN ROTATOR CUFF REPAIR AND DISTAL CLAVICLE ACROMINECTOMY Left 03/13/2022   Procedure: LEFT SHOULDER ARTHROSCOPY, WITH MINI OPEN ROTATOR CUFF TEAR REPAIR & BICEPS  TENODESIS;  Surgeon: Cammy Copa, MD;   Location: [facility] OR;  Service: Orthopedics;  Laterality: Left;   THROAT SURGERY     vocal cords injected with liquid bone  2005   [place], [place]    FAMILY HISTORY: Family History  Problem Relation Age of Onset   Alzheimer's disease Mother        Deceased, 23   Hypercholesterolemia Mother    Heart attack Father    Congestive Heart Failure Father        Deceased, 70   Emphysema Father        smoker   CAD Father    Benign prostatic hyperplasia Father    Skin cancer Sister    Allergic rhinitis Sister    Hypertension Sister    Hypercholesterolemia Sister    Obesity Sister    Irritable bowel syndrome Sister    Aneurysm Brother    Skin cancer Brother        basal cell   Memory loss Brother    COPD Brother    Congestive Heart Failure Brother    Emphysema Brother    Stroke Maternal Grandmother    Dementia Maternal Grandmother    Prostate cancer Maternal Grandfather    Dementia Maternal Grandfather    Diabetes Son    Hypertension Son    Diabetes Daughter    Asthma Daughter    Stroke Paternal Grandmother    CAD Paternal Grandfather     SOCIAL HISTORY: Social History   Socioeconomic History   Marital status: Widowed    Spouse name: Not on file   Number of children: Not on file   Years of education: 3 yrs nursing school after H.S.   Highest education level: Not on file  Occupational History   Not on file  Tobacco Use   Smoking status: Never   Smokeless tobacco: Never  Vaping Use   Vaping status: Never Used  Substance and Sexual Activity   Alcohol use: Not Currently    Comment: was a glass of wine one time per year   Drug use: Never   Sexual activity: Yes    Birth control/protection: Condom  Other Topics Concern   Not on file  Social History Narrative   Lives alone in a one story home.  Has 2 children.  Retired Charity fundraiser.  Husband passed away in Nov 08, 2016.        Son lives next to her   Caffeine: NONE since 2000, was previously 1 cup coffee/day from 1969-2000    Social Drivers of Health   Financial Resource Strain: Not on file  Food Insecurity: Not on file  Transportation Needs: Not on file  Physical Activity: Not on file  Stress: Not on file  Social Connections: Not on file  Intimate Partner Violence: Not on file      PHYSICAL EXAM  Vitals:   11/28/23 1250  Weight: 122 lb 6.4 oz (55.5 kg)  Height: 5' 8.25" (1.734 m)   Body mass index is 18.47 kg/m.     11/28/2023    1:03 PM 11/28/2022    1:12 PM 11/23/2021    1:28 PM  MMSE - Mini Mental State Exam  Orientation to time 4 4 4   Orientation to Place 4 5 5   Registration 3 3 3   Attention/ Calculation 5 5 5   Recall 3 3 2   Language- name 2 objects 2 2 2   Language- repeat 1 1 1   Language- follow 3 step command 3 3 3  Language- read & follow direction 1 1 1   Write a sentence 1 1 1   Copy design 1 1 1   Total score 28 29 28      Generalized: Well developed, in no acute distress   Neurological examination  Mentation: Alert oriented to time, place, history taking. Follows all commands speech and language fluent Cranial nerve II-XII: Pupils were equal round reactive to light. Extraocular movements were full, visual field were full on confrontational test. Facial sensation and strength were normal. Uvula tongue midline. Head turning and shoulder shrug  were normal and symmetric. Motor: The motor testing reveals 5 over 5 strength of all 4 extremities. Good symmetric motor tone is noted throughout.  Sensory: Sensory testing is intact to soft touch on all 4 extremities. No evidence of extinction is noted.  Coordination: Cerebellar testing reveals good finger-nose-finger and heel-to-shin bilaterally.  Gait and station: Gait is normal.   Reflexes: Deep tendon reflexes are symmetric and normal bilaterally.   DIAGNOSTIC DATA (LABS, IMAGING, TESTING) - I reviewed patient records, labs, notes, testing and imaging myself where available.  Lab Results  Component Value Date   WBC 5.9  03/13/2022   HGB 13.8 03/13/2022   HCT 44.0 03/13/2022   MCV 103.5 (H) 03/13/2022   PLT 156 03/13/2022      Component Value Date/Time   NA 140 03/13/2022 1131   NA 142 02/06/2019 1019   K 4.3 03/13/2022 1131   CL 108 03/13/2022 1131   CO2 22 03/13/2022 1131   GLUCOSE 81 03/13/2022 1131   BUN 24 (H) 03/13/2022 1131   BUN 18 02/06/2019 1019   CREATININE 0.89 03/13/2022 1131   CALCIUM 9.4 03/13/2022 1131   ALT 20 05/28/2019 1100   GFRNONAA >60 03/13/2022 1131   GFRAA 68 02/06/2019 1019   Lab Results  Component Value Date   CHOL 129 05/28/2019   HDL 76 05/28/2019   LDLCALC 39 05/28/2019   TRIG 71 05/28/2019   CHOLHDL 1.7 05/28/2019   No results found for: "HGBA1C" Lab Results  Component Value Date   VITAMINB12 673 02/16/2016   Lab Results  Component Value Date   TSH 1.65 02/16/2016      ASSESSMENT AND PLAN 83 y.o. year old female  has a past medical history of Allergic rhinoconjunctivitis (08/02/2015), Allergies, Arthritis, Asthma, Asthma, well controlled (10/31/2017), Blepharitis, Coronary artery disease (06/21/2021), GERD (gastroesophageal reflux disease), Glaucoma, Hereditary and idiopathic peripheral neuropathy (02/16/2016), fracture, Left wrist injury (01/18/2017), LPRD (laryngopharyngeal reflux disease) (08/02/2015), Macular degeneration, Memory loss, Migraine, Moderate persistent asthma (08/02/2015), Osteopenia, Sensorimotor neuropathy, Thrush, oral (10/31/2017), and Vertigo. here with:   Memory disturbance  - Continue Aricept 10 mg at bedtime - MMSE stable 27/30 - Advised if symptoms worsen or she develops new symptoms she should let us know - FU in 1 year or sooner if needed.       Butch Penny, MSN, NP-C 11/28/2023, 12:58 PM [facility] [address] [place], [place] [address] [phone]

## 2023-11-29 ENCOUNTER — Telehealth: Payer: Self-pay | Admitting: Gastroenterology

## 2023-11-29 NOTE — Telephone Encounter (Signed)
 [facility] records received and reviewed  Cologuard 10/2022 was ordered and was negative  EGD 06/2018 showed intact Niesen fundoplication, gastritis, benign fundic gland polyps.  Biopsies negative for H. pylori.  Biopsy showed chronic inactive gastritis  Colonoscopy 08/2012 was normal with excellent prep.  Moderate internal hemorrhoids.  Colonoscopies 2003 2008 were normal

## 2023-12-02 ENCOUNTER — Other Ambulatory Visit: Payer: Self-pay | Admitting: Cardiology

## 2023-12-09 NOTE — Progress Notes (Unsigned)
 Chief Complaint: Follow-up Primary GI MD: Dr. Doy Hutching  HPI: 83 year old female with medical history as listed below presents for follow-up.  Last seen 10/29/2023 for dry heaves, belching, nausea ongoing since December 2024 after recovering from viral respiratory illness that involved significant coughing.  Patient was given Zofran for nausea relief.  She also had pellet-like/stringy bowel movements in which fiber was recommended.  KUB showed mild prominent stool within the colon.  Barium swallow showed no recurrent hiatal hernia.  Showed some GERD and esophageal dysmotility but no mass or stricture.  Patient remains on omeprazole 40 Mg twice daily and famotidine 20 Mg twice daily.  ----------------TODAY------------  Patient states her GERD/dry heaves are much improved on omeprazole 40 Mg twice daily and famotidine 20 Mg twice daily.  She is feeling much better in regards to her upper GI symptoms.  In regards to her bowel movements she started taking fiber Gummies.  She takes 1 fiber gummy per day.  She notes not a big change in her bowel movements.  Having 5-6 loose stools in the morning.   PREVIOUS GI WORKUP   Cologuard 10/2022 was ordered and was negative   EGD 06/2018 showed intact Niesen fundoplication, gastritis, benign fundic gland polyps.  Biopsies negative for H. pylori.  Biopsy showed chronic inactive gastritis   Colonoscopy 08/2012 was normal with excellent prep.  Moderate internal hemorrhoids.   Colonoscopies 2003 2008 were normal    Past Medical History:  Diagnosis Date   Allergic rhinoconjunctivitis 08/02/2015   Allergies    Arthritis    hands   Asthma    Dr Laurette Schimke   Asthma, well controlled 10/31/2017   Blepharitis    Coronary artery disease 06/21/2021   GERD (gastroesophageal reflux disease)    Glaucoma    Hereditary and idiopathic peripheral neuropathy 02/16/2016   Hx of fracture    Left wrist 2018; right patella (separate incident) 2018; used braces    Left wrist injury 01/18/2017   LPRD (laryngopharyngeal reflux disease) 08/02/2015   Macular degeneration    Memory loss    in study at [facility] [place], [place] 02/2020   Migraine    Moderate persistent asthma 08/02/2015   Osteopenia    Sensorimotor neuropathy    moderate-severe with chronic denervation- Dr Cyndi Lennert, oral 10/31/2017   Vertigo    occasional positional    Past Surgical History:  Procedure Laterality Date   BUNIONECTOMY Right    Right great toe and little toe, pins in both, [place], [place]   CATARACT EXTRACTION, BILATERAL     with implants; R eye 08/2017 and L eye 09/2017   Cervical decompression and fusion  1997   COLONOSCOPY     2003, 2008, 2013   ECTOPIC PREGNANCY SURGERY     injection laryngeal plasty 2008     per notes [facility]   KNEE SURGERY Left    meniscus @ [employer], [place], [place]   nissan infundibulm     SALPINGECTOMY Right    [place], [place]   SHOULDER ARTHROSCOPY WITH OPEN ROTATOR CUFF REPAIR AND DISTAL CLAVICLE ACROMINECTOMY Left 03/13/2022   Procedure: LEFT SHOULDER ARTHROSCOPY, WITH MINI OPEN ROTATOR CUFF TEAR REPAIR & BICEPS TENODESIS;  Surgeon: Cammy Copa, MD;  Location: [facility] OR;  Service: Orthopedics;  Laterality: Left;   THROAT SURGERY     vocal cords injected with liquid bone  2005   [place], [place]    Current Outpatient Medications  Medication Sig Dispense Refill   acetaminophen (  TYLENOL) 500 MG tablet Take 500 mg by mouth in the morning and at bedtime.     albuterol (PROVENTIL HFA) 108 (90 Base) MCG/ACT inhaler Inhale 2 puffs into the lungs every 6 (six) hours as needed for wheezing or shortness of breath. 18 g 1   antiseptic oral rinse (BIOTENE) LIQD 15 mLs by Mouth Rinse route as needed.     Azelastine HCl 137 MCG/SPRAY SOLN Place 2 sprays into both nostrils 2 (two) times daily. 90 mL 1   bimatoprost (LUMIGAN) 0.01 % SOLN Place 1 drop into both eyes at bedtime.     Budeson-Glycopyrrol-Formoterol  (BREZTRI AEROSPHERE) 160-9-4.8 MCG/ACT AERO Inhale 2 puffs into the lungs 2 (two) times daily. 2 puffs 1-2 times per day. 32.1 g 1   Calcium Carbonate-Vitamin D 600-200 MG-UNIT TABS Take 1 tablet by mouth 2 (two) times daily.     donepezil (ARICEPT) 10 MG tablet Take 1 tablet (10 mg total) by mouth at bedtime. 90 tablet 3   famotidine (PEPCID) 20 MG tablet Take 1 tablet (20 mg total) by mouth 2 (two) times daily. 180 tablet 1   FIBER ADULT GUMMIES PO Take 1 tablet by mouth daily.     fluconazole (DIFLUCAN) 150 MG tablet Take 1 tablet (150 mg total) by mouth every 14 (fourteen) days. 10 tablet 0   fluticasone (FLONASE) 50 MCG/ACT nasal spray Place 2 sprays into both nostrils every morning. USE ONE SPRAY IN EACH NOSTRIL ONCE DAILY (Patient taking differently: Place 2 sprays into both nostrils at bedtime. USE ONE SPRAY IN EACH NOSTRIL ONCE DAILY) 48 g 1   FLUZONE HIGH-DOSE 0.5 ML injection Inject 0.5 mLs into the muscle once.     ipratropium (ATROVENT) 0.06 % nasal spray 2 sprays each nostril every 6 hours if needed. (Patient taking differently: Place 1 spray into the nose 2 (two) times daily. 2 sprays each nostril every 6 hours if needed.) 45 mL 1   Multiple Vitamins-Minerals (PRESERVISION AREDS 2+MULTI VIT PO) Take 1 capsule by mouth in the morning and at bedtime.     nystatin (MYCOSTATIN) 100000 UNIT/ML suspension SWISH AND SWALLOW 5 MLS BY MOUTH EVERY MORNING 473 mL 1   omeprazole (PRILOSEC) 40 MG capsule Take 2 capsules (80 mg total) by mouth 2 (two) times daily. 360 capsule 1   ondansetron (ZOFRAN-ODT) 4 MG disintegrating tablet Take 1 tablet (4 mg total) by mouth every 8 (eight) hours as needed for nausea or vomiting. 30 tablet 2   Polyethyl Glycol-Propyl Glycol (SYSTANE OP) Place 1 drop into both eyes as needed (dry eyes).     rosuvastatin (CRESTOR) 5 MG tablet TAKE 1 TABLET BY MOUTH ONCE DAILY 30 tablet 0   SIMPLY SALINE NA Place 1 spray into the nose 2 (two) times daily.     SPIKEVAX syringe  Inject 0.5 mLs into the muscle once.     VITAMIN D-VITAMIN K PO Take 1 tablet by mouth 2 (two) times daily.     No current facility-administered medications for this visit.    Allergies as of 12/10/2023 - Review Complete 12/10/2023  Allergen Reaction Noted   Atorvastatin Other (See Comments) 08/13/2023   Diazepam Other (See Comments) 11/25/2019    Family History  Problem Relation Age of Onset   Alzheimer's disease Mother        Deceased, 69   Hypercholesterolemia Mother    Heart attack Father    Congestive Heart Failure Father        Deceased, 68   Emphysema Father  smoker   CAD Father    Benign prostatic hyperplasia Father    Skin cancer Sister    Allergic rhinitis Sister    Hypertension Sister    Hypercholesterolemia Sister    Obesity Sister    Irritable bowel syndrome Sister    Aneurysm Brother    Skin cancer Brother        basal cell   Memory loss Brother    COPD Brother    Congestive Heart Failure Brother    Emphysema Brother    Stroke Maternal Grandmother    Dementia Maternal Grandmother    Prostate cancer Maternal Grandfather    Dementia Maternal Grandfather    Stroke Paternal Grandmother    CAD Paternal Grandfather    Diabetes Daughter    Asthma Daughter    Diabetes Son    Hypertension Son    Colon cancer Neg Hx    Esophageal cancer Neg Hx    Stomach cancer Neg Hx    Pancreatic cancer Neg Hx     Social History   Socioeconomic History   Marital status: Widowed    Spouse name: Not on file   Number of children: Not on file   Years of education: 3 yrs nursing school after H.S.   Highest education level: Not on file  Occupational History   Not on file  Tobacco Use   Smoking status: Never   Smokeless tobacco: Never  Vaping Use   Vaping status: Never Used  Substance and Sexual Activity   Alcohol use: Not Currently    Comment: was a glass of wine one time per year   Drug use: Never   Sexual activity: Yes    Birth control/protection:  Condom  Other Topics Concern   Not on file  Social History Narrative   Lives alone in a one story home.  Has 2 children.  Retired Charity fundraiser.  Husband passed away in 11/06/16.        Son lives next to her   Caffeine: NONE since 2000, was previously 1 cup coffee/day from 1969-2000   Social Drivers of Health   Financial Resource Strain: Not on file  Food Insecurity: Not on file  Transportation Needs: Not on file  Physical Activity: Not on file  Stress: Not on file  Social Connections: Not on file  Intimate Partner Violence: Not on file    Review of Systems:    Constitutional: No weight loss, fever, chills, weakness or fatigue HEENT: Eyes: No change in vision               Ears, Nose, Throat:  No change in hearing or congestion Skin: No rash or itching Cardiovascular: No chest pain, chest pressure or palpitations   Respiratory: No SOB or cough Gastrointestinal: See HPI and otherwise negative Genitourinary: No dysuria or change in urinary frequency Neurological: No headache, dizziness or syncope Musculoskeletal: No new muscle or joint pain Hematologic: No bleeding or bruising Psychiatric: No history of depression or anxiety    Physical Exam:  Vital signs: BP (!) 96/58   Pulse (!) 54   Ht 5' 8.25" (1.734 m)   Wt 121 lb (54.9 kg)   SpO2 98%   BMI 18.26 kg/m   Constitutional: NAD, Well developed, Well nourished, alert and cooperative Head:  Normocephalic and atraumatic. Eyes:   PEERL, EOMI. No icterus. Conjunctiva pink. Respiratory: Respirations even and unlabored. Lungs clear to auscultation bilaterally.   No wheezes, crackles, or rhonchi.  Cardiovascular:  Regular rate and rhythm.  No peripheral edema, cyanosis or pallor.  Gastrointestinal:  Soft, nondistended, nontender. No rebound or guarding. Normal bowel sounds. No appreciable masses or hepatomegaly. Rectal:  Not performed.  Msk:  Symmetrical without gross deformities. Without edema, no deformity or joint abnormality.   Neurologic:  Alert and  oriented x4;  grossly normal neurologically.  Skin:   Dry and intact without significant lesions or rashes. Psychiatric: Oriented to person, place and time. Demonstrates good judgement and reason without abnormal affect or behaviors.   RELEVANT LABS AND IMAGING: CBC    Component Value Date/Time   WBC 5.9 03/13/2022 1131   RBC 4.25 03/13/2022 1131   HGB 13.8 03/13/2022 1131   HCT 44.0 03/13/2022 1131   PLT 156 03/13/2022 1131   MCV 103.5 (H) 03/13/2022 1131   MCH 32.5 03/13/2022 1131   MCHC 31.4 03/13/2022 1131   RDW 13.2 03/13/2022 1131    CMP     Component Value Date/Time   NA 140 03/13/2022 1131   NA 142 02/06/2019 1019   K 4.3 03/13/2022 1131   CL 108 03/13/2022 1131   CO2 22 03/13/2022 1131   GLUCOSE 81 03/13/2022 1131   BUN 24 (H) 03/13/2022 1131   BUN 18 02/06/2019 1019   CREATININE 0.89 03/13/2022 1131   CALCIUM 9.4 03/13/2022 1131   ALT 20 05/28/2019 1100   GFRNONAA >60 03/13/2022 1131   GFRAA 68 02/06/2019 1019     Assessment/Plan:   GERD Nausea Dry heaves Eructation Symptoms began after recovering from viral illness December 2024 that involved significant coughing.  Symptoms have improved on omeprazole twice daily and famotidine 20 Mg twice daily.  EGD 2019 showed intact Niesen fundoplication, fundic gland polyps, H. pylori negative gastritis. - Continue omeprazole 40 Mg twice daily - Continue famotidine 20 Mg twice daily - Educated patient on lifestyle modifications - Can continue Zofran for nausea relief as needed - Reassuringly symptoms have improved  Altered bowel habits Having pellet-like/stringy stools or loose stools without improvement on 1 fiber gummy per day.  KUB showing mild prominent stool throughout the colon.  Cologuard 10/2022 negative. - Recommend doing fiber powder versus Gummies.  Recommend 1 to 2 tablespoons/day. - She will call us with an update before her next appointment - Follow-up 8 to 12  weeks  Ariadne Rissmiller [facility] 12/10/2023, 11:27 AM  Cc: Thana Ates, MD  I have reviewed the clinic note as outlined by Boone Master, PA and agree with the assessment, plan and medical decision making. Ms. Heinlein returns to the office for follow-up of nausea, dry heaves and belching that evolved after a viral illness in December 2024.  She is fortunately much improved on omeprazole 40 mg p.o. twice daily as well as famotidine 20 mg p.o. twice daily -we will continue current regimen.  Also noted to have constipation.  Endorses 5-6 loose morning bowel movements.  No change with fiber gummy.  Reasonable to try fiber versus gummy supplement.  Maren Beach, MD

## 2023-12-10 ENCOUNTER — Ambulatory Visit: Payer: PPO | Admitting: Gastroenterology

## 2023-12-10 ENCOUNTER — Encounter: Payer: Self-pay | Admitting: Gastroenterology

## 2023-12-10 VITALS — BP 96/58 | HR 54 | Ht 68.25 in | Wt 121.0 lb

## 2023-12-10 DIAGNOSIS — R194 Change in bowel habit: Secondary | ICD-10-CM

## 2023-12-10 DIAGNOSIS — R142 Eructation: Secondary | ICD-10-CM

## 2023-12-10 DIAGNOSIS — K219 Gastro-esophageal reflux disease without esophagitis: Secondary | ICD-10-CM

## 2023-12-10 DIAGNOSIS — R110 Nausea: Secondary | ICD-10-CM

## 2023-12-10 DIAGNOSIS — Z8719 Personal history of other diseases of the digestive system: Secondary | ICD-10-CM

## 2023-12-10 DIAGNOSIS — K5900 Constipation, unspecified: Secondary | ICD-10-CM

## 2023-12-10 DIAGNOSIS — R1110 Vomiting, unspecified: Secondary | ICD-10-CM

## 2023-12-10 NOTE — Patient Instructions (Addendum)
 A high fiber diet with plenty of fluids (up to 8 glasses of water daily) is suggested to relieve these symptoms.  Benefiber, 1 tablespoon once or twice daily can be used to keep bowels regular if needed.   _______________________________________________________  If your blood pressure at your visit was 140/90 or greater, please contact your primary care physician to follow up on this.  _______________________________________________________  If you are age 29 or older, your body mass index should be between 23-30. Your Body mass index is 18.26 kg/m. If this is out of the aforementioned range listed, please consider follow up with your Primary Care Provider.  If you are age 55 or younger, your body mass index should be between 19-25. Your Body mass index is 18.26 kg/m. If this is out of the aformentioned range listed, please consider follow up with your Primary Care Provider.   ________________________________________________________  The [facility] providers would like to encourage you to use [facility] to communicate with providers for non-urgent requests or questions.  Due to long hold times on the telephone, sending your provider a message by [facility] may be a faster and more efficient way to get a response.  Please allow 48 business hours for a response.  Please remember that this is for non-urgent requests.  _______________________________________________________  Thank you for trusting me with your gastrointestinal care!   Boone Master, PA

## 2023-12-18 DIAGNOSIS — G3184 Mild cognitive impairment, so stated: Secondary | ICD-10-CM | POA: Diagnosis not present

## 2023-12-18 DIAGNOSIS — J4530 Mild persistent asthma, uncomplicated: Secondary | ICD-10-CM | POA: Diagnosis not present

## 2023-12-18 DIAGNOSIS — G629 Polyneuropathy, unspecified: Secondary | ICD-10-CM | POA: Diagnosis not present

## 2023-12-18 DIAGNOSIS — G250 Essential tremor: Secondary | ICD-10-CM | POA: Diagnosis not present

## 2023-12-18 DIAGNOSIS — E441 Mild protein-calorie malnutrition: Secondary | ICD-10-CM | POA: Diagnosis not present

## 2023-12-18 DIAGNOSIS — R351 Nocturia: Secondary | ICD-10-CM | POA: Diagnosis not present

## 2024-01-03 ENCOUNTER — Other Ambulatory Visit: Payer: Self-pay | Admitting: Cardiology

## 2024-01-08 DIAGNOSIS — H353132 Nonexudative age-related macular degeneration, bilateral, intermediate dry stage: Secondary | ICD-10-CM | POA: Diagnosis not present

## 2024-01-08 DIAGNOSIS — H0100A Unspecified blepharitis right eye, upper and lower eyelids: Secondary | ICD-10-CM | POA: Diagnosis not present

## 2024-01-08 DIAGNOSIS — H01004 Unspecified blepharitis left upper eyelid: Secondary | ICD-10-CM | POA: Diagnosis not present

## 2024-01-08 DIAGNOSIS — H52203 Unspecified astigmatism, bilateral: Secondary | ICD-10-CM | POA: Diagnosis not present

## 2024-01-08 DIAGNOSIS — Z961 Presence of intraocular lens: Secondary | ICD-10-CM | POA: Diagnosis not present

## 2024-01-08 DIAGNOSIS — H04123 Dry eye syndrome of bilateral lacrimal glands: Secondary | ICD-10-CM | POA: Diagnosis not present

## 2024-01-08 DIAGNOSIS — H524 Presbyopia: Secondary | ICD-10-CM | POA: Diagnosis not present

## 2024-01-08 DIAGNOSIS — H401213 Low-tension glaucoma, right eye, severe stage: Secondary | ICD-10-CM | POA: Diagnosis not present

## 2024-01-09 ENCOUNTER — Telehealth: Payer: Self-pay | Admitting: Adult Health

## 2024-01-09 NOTE — Telephone Encounter (Signed)
 Pt called to Cancel Appt  . Pt states is feeling better.

## 2024-01-28 ENCOUNTER — Telehealth: Payer: Self-pay | Admitting: Allergy and Immunology

## 2024-01-28 NOTE — Telephone Encounter (Signed)
 Is twice daily regimen okay?

## 2024-01-28 NOTE — Telephone Encounter (Signed)
 Patient states she is having thrush. She increased her Nystatin  to twice a day and it has helped. She just wanted to confirm with Dr. Jerelene Monday that it is OK for her to do this. I did inform her that Dr. Jerelene Monday is out of office and we will send this message to another provider.

## 2024-01-28 NOTE — Telephone Encounter (Signed)
 Pt informed that twice daily swish and swallow is fine to do for now since she is having an issue, once its better she'll go back to once daily.

## 2024-02-03 ENCOUNTER — Other Ambulatory Visit: Payer: Self-pay | Admitting: Cardiology

## 2024-02-18 ENCOUNTER — Ambulatory Visit: Admitting: Gastroenterology

## 2024-03-17 ENCOUNTER — Ambulatory Visit (INDEPENDENT_AMBULATORY_CARE_PROVIDER_SITE_OTHER): Payer: PPO | Admitting: Allergy and Immunology

## 2024-03-17 ENCOUNTER — Other Ambulatory Visit: Payer: Self-pay

## 2024-03-17 ENCOUNTER — Encounter: Payer: Self-pay | Admitting: Allergy and Immunology

## 2024-03-17 VITALS — BP 110/62 | HR 62 | Temp 98.0°F

## 2024-03-17 DIAGNOSIS — J3089 Other allergic rhinitis: Secondary | ICD-10-CM | POA: Diagnosis not present

## 2024-03-17 DIAGNOSIS — B370 Candidal stomatitis: Secondary | ICD-10-CM | POA: Diagnosis not present

## 2024-03-17 DIAGNOSIS — J4540 Moderate persistent asthma, uncomplicated: Secondary | ICD-10-CM

## 2024-03-17 DIAGNOSIS — K219 Gastro-esophageal reflux disease without esophagitis: Secondary | ICD-10-CM | POA: Diagnosis not present

## 2024-03-17 MED ORDER — FLUTICASONE PROPIONATE 50 MCG/ACT NA SUSP: 2.0000 | Freq: Every morning | NASAL | 1 refills | Status: DC

## 2024-03-17 MED ORDER — NYSTATIN 100000 UNIT/ML MT SUSP: 5.0000 mL | Freq: Every morning | OROMUCOSAL | 1 refills | Status: DC

## 2024-03-17 MED ORDER — IPRATROPIUM BROMIDE 0.06 % NA SOLN: 2.0000 | Freq: Four times a day (QID) | NASAL | 1 refills | Status: AC | PRN

## 2024-03-17 MED ORDER — AZELASTINE HCL 137 MCG/SPRAY NA SOLN: 2.0000 | Freq: Two times a day (BID) | NASAL | 1 refills | Status: DC

## 2024-03-17 MED ORDER — BREZTRI AEROSPHERE 160-9-4.8 MCG/ACT IN AERO: 2.0000 | INHALATION_SPRAY | Freq: Two times a day (BID) | RESPIRATORY_TRACT | 1 refills | Status: DC

## 2024-03-17 MED ORDER — FLUCONAZOLE 150 MG PO TABS
150.0000 mg | ORAL_TABLET | ORAL | 0 refills | Status: AC
Start: 2024-03-17 — End: ?

## 2024-03-17 MED ORDER — ALBUTEROL SULFATE HFA 108 (90 BASE) MCG/ACT IN AERS: 2.0000 | INHALATION_SPRAY | Freq: Four times a day (QID) | RESPIRATORY_TRACT | 1 refills | Status: AC | PRN

## 2024-03-17 NOTE — Progress Notes (Unsigned)
  - [place] - [place] - [place]   Follow-up Note  Referring Provider: Dwight Trula SQUIBB, MD Primary Provider: Dwight Trula SQUIBB, MD Date of Office Visit: 03/17/2024  Subjective:   Victoria Trevino (DOB: 10-11-1940) is a 82 y.o. female who [facility] in re-evaluation of the following:  HPI: Victoria Trevino returns to this clinic in evaluation of asthma, allergic rhinitis, reflux, thrush, and a history of inhaled steroid induced laryngitis.  I last saw her in this clinic 17 September 2023.  Somewhere between 3 to 4 weeks ago she was [place] large [place] and acquired a viral respiratory tract infection and gave rise to sneezing and nasal congestion and nose blowing and coughing.  Initially the material she was coughing was green but as she has improved over these 3-1/2 weeks she now has a little bit of cough and a little bit of clear sputum production and no nasal symptoms.  Prior to that point in time she was really doing very well regarding her airway and did not need to use any short acting bronchodilator and overall felt as though she was doing well and could exert herself without much problem.  She did not require systemic steroid or an antibiotic for any type of airway issue.  She had been handling her recurrent thrush with her fluconazole  every other week and some oral nystatin  and that seems to be working quite well.  She has followed up with GI and currently is using her omeprazole  1 or 2 times per day and famotidine  twice a day and she thinks this that is working very well.  Allergies as of 03/17/2024       Reactions   Atorvastatin  Other (See Comments)   Dizziness   Diazepam  Other (See Comments)   Hypersensitivity - over sedated        Medication List    acetaminophen  500 MG tablet Commonly known as: TYLENOL  Take 500 mg by mouth in the morning and at bedtime.   albuterol  108 (90 Base) MCG/ACT inhaler Commonly  known as: Proventil  HFA Inhale 2 puffs into the lungs every 6 (six) hours as needed for wheezing or shortness of breath.   antiseptic oral rinse Liqd 15 mLs by Mouth Rinse route as needed.   Azelastine  HCl 137 MCG/SPRAY Soln Place 2 sprays into both nostrils 2 (two) times daily.   Breztri  Aerosphere 160-9-4.8 MCG/ACT Aero inhaler Generic drug: budesonide -glycopyrrolate-formoterol  Inhale 2 puffs into the lungs 2 (two) times daily. 2 puffs 1-2 times per day.   Calcium  Carbonate-Vitamin D 600-200 MG-UNIT Tabs Take 1 tablet by mouth 2 (two) times daily.   donepezil  10 MG tablet Commonly known as: ARICEPT  Take 1 tablet (10 mg total) by mouth at bedtime.   famotidine  20 MG tablet Commonly known as: PEPCID  Take 1 tablet (20 mg total) by mouth 2 (two) times daily.   FIBER ADULT GUMMIES PO Take 1 tablet by mouth daily.   fluconazole  150 MG tablet Commonly known as: DIFLUCAN  Take 1 tablet (150 mg total) by mouth every 14 (fourteen) days.   fluticasone  50 MCG/ACT nasal spray Commonly known as: FLONASE  Place 2 sprays into both nostrils every morning. USE ONE SPRAY IN EACH NOSTRIL ONCE DAILY What changed: when to take this   Fluzone High-Dose 0.5 ML injection Generic drug: Influenza vac split quadrivalent PF Inject 0.5 mLs into the muscle once.   ipratropium 0.06 % nasal spray Commonly known as: ATROVENT  2 sprays  each nostril every 6 hours if needed.   Lumigan 0.01 % Soln Generic drug: bimatoprost Place 1 drop into both eyes at bedtime.   nystatin  100000 UNIT/ML suspension Commonly known as: MYCOSTATIN  SWISH AND SWALLOW 5 MLS BY MOUTH EVERY MORNING   omeprazole  40 MG capsule Commonly known as: PRILOSEC Take 2 capsules (80 mg total) by mouth 2 (two) times daily.   ondansetron  4 MG disintegrating tablet Commonly known as: ZOFRAN -ODT Take 1 tablet (4 mg total) by mouth every 8 (eight) hours as needed for nausea or vomiting.   PRESERVISION AREDS 2+MULTI VIT PO Take 1  capsule by mouth in the morning and at bedtime.   rosuvastatin  5 MG tablet Commonly known as: CRESTOR  TAKE 1 TABLET BY MOUTH ONCE DAILY   SIMPLY SALINE NA Place 1 spray into the nose 2 (two) times daily.   Spikevax syringe Generic drug: COVID-19 mRNA vaccine Inject 0.5 mLs into the muscle once.   SYSTANE OP Place 1 drop into both eyes as needed (dry eyes).   VITAMIN D-VITAMIN K PO Take 1 tablet by mouth 2 (two) times daily.    Past Medical History:  Diagnosis Date   Allergic rhinoconjunctivitis 08/02/2015   Allergies    Arthritis    hands   Asthma    Dr Camellia Denis   Asthma, well controlled 10/31/2017   Blepharitis    Coronary artery disease 06/21/2021   GERD (gastroesophageal reflux disease)    Glaucoma    Hereditary and idiopathic peripheral neuropathy 02/16/2016   Hx of fracture    Left wrist 2018; right patella (separate incident) 2018; used braces   Left wrist injury 01/18/2017   LPRD (laryngopharyngeal reflux disease) 08/02/2015   Macular degeneration    Memory loss    in study at [place], [place] 02/2020   Migraine    Moderate persistent asthma 08/02/2015   Osteopenia    Sensorimotor neuropathy    moderate-severe with chronic denervation- Dr Tobie Nakai, oral 10/31/2017   Vertigo    occasional positional    Past Surgical History:  Procedure Laterality Date   BUNIONECTOMY Right    Right great toe and little toe, pins in both, [place], [place]    CATARACT EXTRACTION, BILATERAL     with implants; R eye 08/2017 and L eye 09/2017   Cervical decompression and fusion  1997   COLONOSCOPY     2003, 2008, 2013   ECTOPIC PREGNANCY SURGERY     injection laryngeal plasty 2008     per notes [facility]   KNEE SURGERY Left    meniscus @ [employer], [place]   nissan infundibulm     SALPINGECTOMY Right    [place], [place]   SHOULDER ARTHROSCOPY WITH OPEN ROTATOR CUFF REPAIR AND DISTAL CLAVICLE ACROMINECTOMY Left 03/13/2022    Procedure: LEFT SHOULDER ARTHROSCOPY, WITH MINI OPEN ROTATOR CUFF TEAR REPAIR & BICEPS TENODESIS;  Surgeon: Addie Cordella Hamilton, MD;  Location: [facility] OR;  Service: Orthopedics;  Laterality: Left;   THROAT SURGERY     vocal cords injected with liquid bone  2005   [facility], [place]    Review of systems negative except as noted in HPI / PMHx or noted below:  Review of Systems  Constitutional: Negative.   HENT: Negative.    Eyes: Negative.   Respiratory: Negative.    Cardiovascular: Negative.   Gastrointestinal: Negative.   Genitourinary: Negative.   Musculoskeletal: Negative.   Skin: Negative.   Neurological: Negative.   Endo/Heme/Allergies: Negative.  Psychiatric/Behavioral: Negative.       Objective:   Vitals:   03/17/24 1109  BP: 110/62  Pulse: 62  Temp: 98 F (36.7 C)  SpO2: 99%          Physical Exam Constitutional:      Appearance: She is not diaphoretic.  HENT:     Head: Normocephalic.     Right Ear: Tympanic membrane, ear canal and external ear normal.     Left Ear: Tympanic membrane, ear canal and external ear normal.     Nose: Nose normal. No mucosal edema or rhinorrhea.     Mouth/Throat:     Pharynx: Uvula midline. No oropharyngeal exudate.  Eyes:     Conjunctiva/sclera: Conjunctivae normal.  Neck:     Thyroid : No thyromegaly.     Trachea: Trachea normal. No tracheal tenderness or tracheal deviation.  Cardiovascular:     Rate and Rhythm: Normal rate and regular rhythm.     Heart sounds: Normal heart sounds, S1 normal and S2 normal. No murmur heard. Pulmonary:     Effort: No respiratory distress.     Breath sounds: Normal breath sounds. No stridor. No wheezing or rales.  Lymphadenopathy:     Head:     Right side of head: No tonsillar adenopathy.     Left side of head: No tonsillar adenopathy.     Cervical: No cervical adenopathy.  Skin:    Findings: No erythema or rash.     Nails: There is no clubbing.  Neurological:     Mental Status: She is  alert.     Diagnostics: Spirometry was performed and demonstrated an FEV1 of 1.86 at 94 % of predicted.  Assessment and Plan:   1. Asthma, moderate persistent, well-controlled   2. Other allergic rhinitis   3. LPRD (laryngopharyngeal reflux disease)   4. Thrush, oral     1. Treat and prevent airway inflammation:  A. Breztri  - 2 inhalations 1-2 times per day  B. Nasal fluticasone  1-2 spray each nostril 1 time per day  2. Treat reflux as directed by GI team:  3. Treat and prevent thrush:   A. Nystatin  solution 1 teaspoon swish and swallow every morning B. Diflucan  150 mg tablet every other week  4. If needed:  A. OTC Mucinex DM B. Azelastine  C. ProAir  HFA  D. nasal ipratropium 0.06% - 2 sprays each nostril every 6 hours    5. Return to clinic in 6 months or earlier if problem  6. Influenza = Tamiflu. Covid = Paxlovid  Victoria Trevino appears to be resolving a recent viral respiratory tract infection and overall has had pretty good control of her respiratory tract disease which is a combination of respiratory tract inflammation and reflux induced respiratory disease.  She will continue to address both of these issues and she can also continue on their therapy to prevent recurrent thrush as noted above.  Assuming she does well with this plan I will see her back in this clinic in 6 months or earlier if there is a problem.   Camellia Denis, MD Allergy / Immunology [facility]

## 2024-03-17 NOTE — Patient Instructions (Signed)
   1. Treat and prevent airway inflammation:  A. Breztri  - 2 inhalations 1-2 times per day  B. Nasal fluticasone  1-2 spray each nostril 1 time per day  2. Treat reflux as directed by GI team:  3. Treat and prevent thrush:   A. Nystatin  solution 1 teaspoon swish and swallow every morning B. Diflucan  150 mg tablet every other week  4. If needed:  A. OTC Mucinex DM B. Azelastine  C. ProAir  HFA  D. nasal ipratropium 0.06% - 2 sprays each nostril every 6 hours    5. Return to clinic in 6 months or earlier if problem  6. Influenza = Tamiflu. Covid = Paxlovid

## 2024-03-18 ENCOUNTER — Encounter: Payer: Self-pay | Admitting: Allergy and Immunology

## 2024-03-24 ENCOUNTER — Ambulatory Visit: Attending: Cardiology | Admitting: Cardiology

## 2024-03-24 ENCOUNTER — Encounter: Payer: Self-pay | Admitting: Cardiology

## 2024-03-24 VITALS — BP 100/66 | HR 56 | Ht 68.25 in | Wt 119.0 lb

## 2024-03-24 DIAGNOSIS — E785 Hyperlipidemia, unspecified: Secondary | ICD-10-CM

## 2024-03-24 DIAGNOSIS — I2510 Atherosclerotic heart disease of native coronary artery without angina pectoris: Secondary | ICD-10-CM

## 2024-03-24 NOTE — Progress Notes (Signed)
 Cardiology Office Note:  .   Date:  03/24/2024  ID:  Roselind JINNY Roger, DOB 1940-10-21, MRN [id number] PCP: Dwight Trula SQUIBB, MD  [facility] Providers Cardiologist:  Oneil Parchment, MD    History of Present Illness: .   Victoria Trevino is a 83 y.o. female Discussed the use of AI scribe software for clinical note transcription with the patient, who gave verbal consent to proceed.  History of Present Illness Victoria Trevino is an 83 year old female with coronary artery disease who presents for follow-up.  She recently traveled to [place] , where she developed a persistent cough. Initially, the cough was productive of green mucus and lasted for approximately five weeks. During her stay, she consulted a [employer] who provided a remedy that temporarily alleviated her symptoms for about eight hours at a time. Since returning home, she notes improvement and is no longer coughing up green mucus.  Her coronary artery disease was noted to be mild on a CT scan in 2019/04/29, with a coronary calcium  score of 74. She has been on rosuvastatin  5 mg for plaque stabilization. Her most recent coronary calcium  score is 32. She has no history of diabetes and does not smoke.  Family history is significant for her father having a myocardial infarction at age 9 and passing away at 71, and her husband died of a heart attack in 04/28/2016. Her mother had Alzheimer's disease, and a brother died of lung cancer.  Cough has improved but still present.      ROS: No chest pain  Studies Reviewed: SABRA   EKG Interpretation Date/Time:  Tuesday March 24 2024 14:35:46 EDT Ventricular Rate:  56 PR Interval:  164 QRS Duration:  84 QT Interval:  424 QTC Calculation: 409 R Axis:   20  Text Interpretation: Sinus bradycardia When compared with ECG of 09-Aug-2004 14:28, No significant change was found Confirmed by Parchment Oneil ([id number]) on 03/24/2024 2:57:13 PM    Results RADIOLOGY Coronary CT: Mild coronary artery disease  04-29-2019) Coronary calcium  score: 74 Apr 29, 2019)  DIAGNOSTIC ECG: Normal (03/24/2024) Risk Assessment/Calculations:            Physical Exam:   VS:  BP 100/66   Pulse (!) 56   Ht 5' 8.25 (1.734 m)   Wt 119 lb (54 kg)   SpO2 98%   BMI 17.96 kg/m    Wt Readings from Last 3 Encounters:  03/24/24 119 lb (54 kg)  12/10/23 121 lb (54.9 kg)  11/28/23 122 lb 6.4 oz (55.5 kg)    GEN: Thin in no acute distress NECK: No JVD; No carotid bruits CARDIAC: RRR, no murmurs, no rubs, no gallops RESPIRATORY: Mild left upper lobe wheeze  ABDOMEN: Soft, non-tender, non-distended EXTREMITIES:  No edema; No deformity   ASSESSMENT AND PLAN: .    Assessment and Plan Assessment & Plan Mild coronary artery disease Mild coronary artery disease identified on CT in Apr 29, 2019 with a coronary calcium  score of 74, currently reduced to 32 on low-dose rosuvastatin . Family history of myocardial infarction. No diabetes or smoking history. EKG today was normal. No acute cardiac symptoms reported. - Continue rosuvastatin  5 mg once daily - Schedule follow-up in one year with an APP  Lingering cough Lingering cough for five weeks following a trip to [place] , initially productive with green mucus, now improved. Mild raspiness in left upper lung area. Differential includes infection or other respiratory condition. - Refer to primary care physician for further evaluation - Recommend chest x-ray  and blood work - Consider CT scan if necessary         Dispo: 1 year APP Signed, Oneil Parchment, MD

## 2024-03-24 NOTE — Patient Instructions (Signed)
 Medication Instructions:  The current medical regimen is effective;  continue present plan and medications.  *If you need a refill on your cardiac medications before your next appointment, please call your pharmacy*  Follow-Up: At [facility], you and your health needs are our priority.  As part of our continuing mission to provide you with exceptional heart care, our providers are all part of one team.  This team includes your primary Cardiologist (physician) and Advanced Practice Providers or APPs (Physician Assistants and Nurse Practitioners) who all work together to provide you with the care you need, when you need it.  Your next appointment:   1 year(s)  Provider:   One of our Advanced Practice Providers (APPs): Melita Springer, PA-C  Friddie Jetty, NP Evaline Hill, NP  Theotis Flake, PA-C Lawana Pray, NP  Willis Harter, PA-C Lovette Rud, PA-C  [place], PA-C Ernest Dick, NP  Marlana Silvan, NP Marcie Sever, PA-C  Laquita Plant, PA-C    Dayna Dunn, PA-C  Scott Weaver, PA-C Palmer Bobo, NP Katlyn West, NP Callie Goodrich, PA-C  Evan Williams, PA-C Sheng Haley, PA-C  Xika Zhao, NP Kathleen Johnson, PA-C       We recommend signing up for the patient portal called "MyChart".  Sign up information is provided on this After Visit Summary.  MyChart is used to connect with patients for Virtual Visits (Telemedicine).  Patients are able to view lab/test results, encounter notes, upcoming appointments, etc.  Non-urgent messages can be sent to your provider as well.   To learn more about what you can do with MyChart, go to [email or web address].

## 2024-03-25 DIAGNOSIS — J8410 Pulmonary fibrosis, unspecified: Secondary | ICD-10-CM | POA: Diagnosis not present

## 2024-03-25 DIAGNOSIS — R059 Cough, unspecified: Secondary | ICD-10-CM | POA: Diagnosis not present

## 2024-03-25 DIAGNOSIS — J4530 Mild persistent asthma, uncomplicated: Secondary | ICD-10-CM | POA: Diagnosis not present

## 2024-04-08 ENCOUNTER — Other Ambulatory Visit: Payer: Self-pay | Admitting: Cardiology

## 2024-04-25 ENCOUNTER — Other Ambulatory Visit: Payer: Self-pay | Admitting: Allergy and Immunology

## 2024-05-05 DIAGNOSIS — S0502XA Injury of conjunctiva and corneal abrasion without foreign body, left eye, initial encounter: Secondary | ICD-10-CM | POA: Diagnosis not present

## 2024-05-12 ENCOUNTER — Other Ambulatory Visit: Payer: Self-pay | Admitting: Internal Medicine

## 2024-05-12 DIAGNOSIS — Z1231 Encounter for screening mammogram for malignant neoplasm of breast: Secondary | ICD-10-CM

## 2024-06-11 ENCOUNTER — Ambulatory Visit
Admission: RE | Admit: 2024-06-11 | Discharge: 2024-06-11 | Disposition: A | Discharge status: Home / Self Care | Source: Ambulatory Visit | Attending: Internal Medicine | Admitting: Internal Medicine

## 2024-06-11 DIAGNOSIS — Z1231 Encounter for screening mammogram for malignant neoplasm of breast: Secondary | ICD-10-CM

## 2024-06-18 DIAGNOSIS — K219 Gastro-esophageal reflux disease without esophagitis: Secondary | ICD-10-CM | POA: Diagnosis not present

## 2024-06-18 DIAGNOSIS — J4530 Mild persistent asthma, uncomplicated: Secondary | ICD-10-CM | POA: Diagnosis not present

## 2024-06-18 DIAGNOSIS — Z0000 Encounter for general adult medical examination without abnormal findings: Secondary | ICD-10-CM | POA: Diagnosis not present

## 2024-06-18 DIAGNOSIS — E7800 Pure hypercholesterolemia, unspecified: Secondary | ICD-10-CM | POA: Diagnosis not present

## 2024-06-18 DIAGNOSIS — M8589 Other specified disorders of bone density and structure, multiple sites: Secondary | ICD-10-CM | POA: Diagnosis not present

## 2024-06-18 DIAGNOSIS — G3184 Mild cognitive impairment, so stated: Secondary | ICD-10-CM | POA: Diagnosis not present

## 2024-06-18 DIAGNOSIS — Z1331 Encounter for screening for depression: Secondary | ICD-10-CM | POA: Diagnosis not present

## 2024-06-18 DIAGNOSIS — G629 Polyneuropathy, unspecified: Secondary | ICD-10-CM | POA: Diagnosis not present

## 2024-06-18 DIAGNOSIS — G250 Essential tremor: Secondary | ICD-10-CM | POA: Diagnosis not present

## 2024-06-18 DIAGNOSIS — Z79899 Other long term (current) drug therapy: Secondary | ICD-10-CM | POA: Diagnosis not present

## 2024-06-18 DIAGNOSIS — K9089 Other intestinal malabsorption: Secondary | ICD-10-CM | POA: Diagnosis not present

## 2024-06-24 DIAGNOSIS — H401213 Low-tension glaucoma, right eye, severe stage: Secondary | ICD-10-CM | POA: Diagnosis not present

## 2024-06-24 DIAGNOSIS — H04123 Dry eye syndrome of bilateral lacrimal glands: Secondary | ICD-10-CM | POA: Diagnosis not present

## 2024-06-24 DIAGNOSIS — H0100B Unspecified blepharitis left eye, upper and lower eyelids: Secondary | ICD-10-CM | POA: Diagnosis not present

## 2024-06-24 DIAGNOSIS — H0100A Unspecified blepharitis right eye, upper and lower eyelids: Secondary | ICD-10-CM | POA: Diagnosis not present

## 2024-06-29 ENCOUNTER — Ambulatory Visit
Admission: EM | Admit: 2024-06-29 | Discharge: 2024-06-29 | Disposition: A | Discharge status: Home / Self Care | Attending: Student | Admitting: Student

## 2024-06-29 ENCOUNTER — Ambulatory Visit

## 2024-06-29 ENCOUNTER — Encounter: Payer: Self-pay | Admitting: *Deleted

## 2024-06-29 ENCOUNTER — Other Ambulatory Visit: Payer: Self-pay

## 2024-06-29 DIAGNOSIS — Z981 Arthrodesis status: Secondary | ICD-10-CM | POA: Diagnosis not present

## 2024-06-29 DIAGNOSIS — M47812 Spondylosis without myelopathy or radiculopathy, cervical region: Secondary | ICD-10-CM | POA: Diagnosis not present

## 2024-06-29 DIAGNOSIS — S134XXA Sprain of ligaments of cervical spine, initial encounter: Secondary | ICD-10-CM | POA: Diagnosis not present

## 2024-06-29 DIAGNOSIS — M4312 Spondylolisthesis, cervical region: Secondary | ICD-10-CM | POA: Diagnosis not present

## 2024-06-29 DIAGNOSIS — M542 Cervicalgia: Secondary | ICD-10-CM | POA: Diagnosis not present
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
## 2024-07-20 DIAGNOSIS — D696 Thrombocytopenia, unspecified: Secondary | ICD-10-CM | POA: Diagnosis not present

## 2024-08-19 ENCOUNTER — Telehealth: Payer: Self-pay | Admitting: Allergy and Immunology

## 2024-08-19 MED ORDER — NYSTATIN 100000 UNIT/ML MT SUSP: 5.0000 mL | Freq: Every morning | OROMUCOSAL | 0 refills | Status: DC

## 2024-08-19 NOTE — Telephone Encounter (Signed)
 Patient called requesting a refill on her medication nystatin  (MYCOSTATIN ) that her pharmacy will not refill the medication till the provider signs off on it and to please give her a call.

## 2024-08-19 NOTE — Telephone Encounter (Signed)
 I called the patient and informed refill has been sent in.

## 2024-08-21 ENCOUNTER — Other Ambulatory Visit: Payer: Self-pay | Admitting: Allergy and Immunology

## 2024-09-06 NOTE — Patient Instructions (Incomplete)
   1. Treat and prevent airway inflammation:  A. Breztri  - 2 inhalations 1-2 times per day  B. Nasal fluticasone  1-2 spray each nostril 1 time per day  2. Treat reflux as directed by GI team:  3. Treat and prevent thrush:   A. Nystatin  solution 1 teaspoon swish and swallow every morning B. Diflucan  150 mg tablet every other week  4. If needed:  A. OTC Mucinex DM B. Azelastine  C. ProAir  HFA  D. nasal ipratropium 0.06% - 2 sprays each nostril every 6 hours    5. Return to clinic in 6 months or earlier if problem  6. Influenza = Tamiflu. Covid = Paxlovid

## 2024-09-06 NOTE — Progress Notes (Unsigned)
" ° °  [address] [place] [address] Dept: [phone]  FOLLOW UP NOTE  Patient ID: Victoria Trevino, female    DOB: April 23, 1941  Age: 84 y.o. MRN: [id number] Date of Office Visit: 09/07/2024  Assessment  Chief Complaint: No chief complaint on file.  HPI Victoria Trevino is an 84 year old female who presents to the clinic for a follow up visit. She was last seen in this clinic on 03/17/2024 by Dr. Kozlow for evaluation of asthma, allergic rhintiis, reflux, and thrush.    Discussed the use of AI scribe software for clinical note transcription with the patient, who gave verbal consent to proceed.  History of Present Illness      Drug Allergies:  Allergies[1]  Physical Exam: There were no vitals taken for this visit.   Physical Exam  Diagnostics:    Assessment and Plan: No diagnosis found.  No orders of the defined types were placed in this encounter.   There are no Patient Instructions on file for this visit.  No follow-ups on file.    Thank you for the opportunity to care for this patient.  Please do not hesitate to contact me with questions.  Arlean Mutter, FNP [facility]          [1]  Allergies Allergen Reactions   Atorvastatin  Other (See Comments)    Dizziness   Diazepam  Other (See Comments)    Hypersensitivity - over sedated   "

## 2024-09-07 ENCOUNTER — Encounter: Payer: Self-pay | Admitting: Family Medicine

## 2024-09-07 ENCOUNTER — Other Ambulatory Visit: Payer: Self-pay

## 2024-09-07 ENCOUNTER — Ambulatory Visit: Admitting: Family Medicine

## 2024-09-07 VITALS — BP 110/80 | HR 57 | Temp 97.9°F | Resp 17

## 2024-09-07 DIAGNOSIS — B370 Candidal stomatitis: Secondary | ICD-10-CM | POA: Diagnosis not present

## 2024-09-07 DIAGNOSIS — J4550 Severe persistent asthma, uncomplicated: Secondary | ICD-10-CM | POA: Diagnosis not present

## 2024-09-07 DIAGNOSIS — K219 Gastro-esophageal reflux disease without esophagitis: Secondary | ICD-10-CM

## 2024-09-07 DIAGNOSIS — J3089 Other allergic rhinitis: Secondary | ICD-10-CM

## 2024-09-07 MED ORDER — BREZTRI AEROSPHERE 160-9-4.8 MCG/ACT IN AERO: 2.0000 | INHALATION_SPRAY | Freq: Two times a day (BID) | RESPIRATORY_TRACT | 1 refills | Status: DC

## 2024-09-17 ENCOUNTER — Telehealth: Payer: Self-pay | Admitting: Allergy and Immunology

## 2024-09-17 ENCOUNTER — Other Ambulatory Visit: Payer: Self-pay | Admitting: *Deleted

## 2024-09-17 MED ORDER — FLUTICASONE PROPIONATE 50 MCG/ACT NA SUSP: 2.0000 | Freq: Every morning | NASAL | 1 refills | Status: DC

## 2024-09-17 MED ORDER — OMEPRAZOLE 40 MG PO CPDR: 80.0000 mg | DELAYED_RELEASE_CAPSULE | Freq: Two times a day (BID) | ORAL | 1 refills | Status: AC

## 2024-09-17 NOTE — Telephone Encounter (Signed)
 Prescriptions have been sent in to pharmacy. Called patient and advised. Patient verbalized understanding.

## 2024-09-17 NOTE — Telephone Encounter (Signed)
 Victoria Trevino called and stated that she is now using [facility] mail order pharmacy. She states the phone number is [phone]. She states that she needs her omeprazole  (PRILOSEC) 40 MG capsule [[id number]] and  fluticasone  (FLONASE ) 50 MCG/ACT nasal spray [[id number]] prescriptions sent in to them. And this will be where her prescriptions should be sent going forward.

## 2024-09-22 MED ORDER — FLUTICASONE PROPIONATE 50 MCG/ACT NA SUSP: 1.0000 | Freq: Every day | NASAL | 1 refills | Status: AC

## 2024-09-22 NOTE — Addendum Note (Signed)
 Addended by: ONEITA CHRISTIANS D on: 09/22/2024 12:13 PM   Modules accepted: Orders

## 2024-09-22 NOTE — Telephone Encounter (Signed)
 Pharmacy sent fax for clarification on the Flonase  spray- it has 2 sets of instructions. Resent with corrected instructions.

## 2024-09-30 ENCOUNTER — Other Ambulatory Visit: Payer: Self-pay | Admitting: Cardiology

## 2024-09-30 ENCOUNTER — Other Ambulatory Visit: Payer: Self-pay

## 2024-09-30 MED ORDER — ROSUVASTATIN CALCIUM 5 MG PO TABS: 5.0000 mg | ORAL_TABLET | Freq: Every day | ORAL | 1 refills | Status: AC

## 2024-09-30 MED ORDER — NYSTATIN 100000 UNIT/ML MT SUSP: OROMUCOSAL | 1 refills | Status: AC

## 2024-09-30 MED ORDER — BREZTRI AEROSPHERE 160-9-4.8 MCG/ACT IN AERO: INHALATION_SPRAY | RESPIRATORY_TRACT | 1 refills | Status: AC

## 2024-09-30 MED ORDER — AZELASTINE HCL 137 MCG/SPRAY NA SOLN: NASAL | 1 refills | Status: AC

## 2024-09-30 MED ORDER — FAMOTIDINE 20 MG PO TABS: 20.0000 mg | ORAL_TABLET | Freq: Two times a day (BID) | ORAL | 1 refills | Status: AC

## 2024-11-25 ENCOUNTER — Ambulatory Visit: Admitting: Adult Health

## 2025-03-16 ENCOUNTER — Ambulatory Visit: Admitting: Allergy and Immunology
